# Patient Record
Sex: Female | Born: 1990 | Race: Black or African American | Hispanic: No | Marital: Single | Smoking: Former smoker
Health system: Southern US, Community
[De-identification: ages and names within clinical notes are randomized; demographics above are authoritative.]

## PROBLEM LIST (undated history)

## (undated) DIAGNOSIS — I10 Essential (primary) hypertension: Secondary | ICD-10-CM

## (undated) DIAGNOSIS — M419 Scoliosis, unspecified: Secondary | ICD-10-CM

## (undated) HISTORY — DX: Essential (primary) hypertension: I10

## (undated) HISTORY — PX: GALLBLADDER SURGERY: SHX652

---

## 2018-12-30 ENCOUNTER — Encounter (HOSPITAL_COMMUNITY): Payer: Self-pay | Admitting: *Deleted

## 2018-12-30 ENCOUNTER — Other Ambulatory Visit: Payer: Self-pay

## 2018-12-30 ENCOUNTER — Emergency Department (HOSPITAL_COMMUNITY)
Admission: EM | Admit: 2018-12-30 | Discharge: 2018-12-30 | Disposition: A | Discharge status: Home / Self Care | Payer: Self-pay | Attending: Emergency Medicine | Admitting: Emergency Medicine

## 2018-12-30 DIAGNOSIS — F17200 Nicotine dependence, unspecified, uncomplicated: Secondary | ICD-10-CM | POA: Insufficient documentation

## 2018-12-30 DIAGNOSIS — K0889 Other specified disorders of teeth and supporting structures: Secondary | ICD-10-CM | POA: Insufficient documentation

## 2018-12-30 MED ORDER — IBUPROFEN 400 MG PO TABS
600.0000 mg | ORAL_TABLET | Freq: Once | ORAL | Status: AC
  Administered 2018-12-30: 600 mg via ORAL
  Filled 2018-12-30: qty 1

## 2018-12-30 MED ORDER — AMOXICILLIN 500 MG PO CAPS: 500.0000 mg | ORAL_CAPSULE | Freq: Three times a day (TID) | ORAL | 0 refills | Status: DC

## 2018-12-30 MED ORDER — ACETAMINOPHEN 500 MG PO TABS
1000.0000 mg | ORAL_TABLET | Freq: Once | ORAL | Status: AC
  Administered 2018-12-30: 1000 mg via ORAL
  Filled 2018-12-30: qty 2

## 2018-12-30 NOTE — Discharge Instructions (Signed)
See your dentist later this week. Take Tylenol and ibuprofen as needed for pain. Take antibiotics as discussed.

## 2018-12-30 NOTE — ED Notes (Signed)
ED Provider at bedside. 

## 2018-12-30 NOTE — ED Provider Notes (Signed)
[facility] EMERGENCY DEPARTMENT Provider Note   CSN: [id number] Arrival date & time: 12/30/18  0003     History   Chief Complaint Chief Complaint  Patient presents with  . Dental Pain    HPI Brianna Lee is a 28 y.o. female.     Patient presents with worsening dental pain for the past 2 days.  Patient has a dentist.  No fevers or chills.  No significant swelling.  Pain constant.     History reviewed. No pertinent past medical history.  There are no active problems to display for this patient.   History reviewed. No pertinent surgical history.   OB History   No obstetric history on file.      Home Medications    Prior to Admission medications   Medication Sig Start Date End Date Taking? Authorizing Provider  amoxicillin (AMOXIL) 500 MG capsule Take 1 capsule (500 mg total) by mouth 3 (three) times daily. 12/30/18   Elnora Morrison, MD    Family History No family history on file.  Social History Social History   Tobacco Use  . Smoking status: Current Every Day Smoker  . Smokeless tobacco: Never Used  Substance Use Topics  . Alcohol use: Never    Frequency: Never  . Drug use: Never     Allergies   Patient has no known allergies.   Review of Systems Review of Systems  Constitutional: Negative for fever.  HENT: Positive for dental problem.   Respiratory: Negative for shortness of breath.   Gastrointestinal: Negative for vomiting.     Physical Exam Updated Vital Signs BP 112/75 (BP Location: Right Arm)   Pulse 77   Temp 97.9 F (36.6 C) (Oral)   Resp 16   Ht 5\' 4"  (1.626 m)   Wt 97.5 kg   LMP 12/23/2018   SpO2 100%   BMI 36.90 kg/m   Physical Exam Vitals signs and nursing note reviewed.  HENT:     Head: Normocephalic.     Comments: Mild tenderness surrounding missing teeth anterior right lower gingiva (premolar region).  No signs of abscess at this time.  No trismus, no submandibular swelling.     Mouth/Throat:   Mouth: Mucous membranes are moist.  Neck:     Musculoskeletal: Normal range of motion. Muscular tenderness present. No neck rigidity.  Cardiovascular:     Rate and Rhythm: Normal rate.  Lymphadenopathy:     Cervical: No cervical adenopathy.  Neurological:     Mental Status: She is alert.      ED Treatments / Results  Labs (all labs ordered are listed, but only abnormal results are displayed) Labs Reviewed - No data to display  EKG None  Radiology No results found.  Procedures Procedures (including critical care time)  Medications Ordered in ED Medications  acetaminophen (TYLENOL) tablet 1,000 mg (has no administration in time range)  ibuprofen (ADVIL) tablet 600 mg (has no administration in time range)     Initial Impression / Assessment and Plan / ED Course  I have reviewed the triage vital signs and the nursing notes.  Pertinent labs & imaging results that were available during my care of the patient were reviewed by me and considered in my medical decision making (see chart for details).       Patient presents with dental pain.  Concern for occult infection/gingivitis.  No abscess to drain at this time.  Discussed importance of antibiotics, pain control and follow-up with dentist this week.  Final Clinical Impressions(s) / ED Diagnoses   Final diagnoses:  Pain, dental    ED Discharge Orders         Ordered    amoxicillin (AMOXIL) 500 MG capsule  3 times daily     12/30/18 0151           Blane OharaZavitz, Emit Kuenzel, MD 12/30/18 0155

## 2018-12-30 NOTE — ED Triage Notes (Signed)
The pt has had a toothache  For 2 days she had ibu  2 hours ago  lmp one week

## 2019-02-04 ENCOUNTER — Emergency Department (HOSPITAL_COMMUNITY)
Admission: EM | Admit: 2019-02-04 | Discharge: 2019-02-04 | Disposition: A | Discharge status: Home / Self Care | Payer: Self-pay | Attending: Emergency Medicine | Admitting: Emergency Medicine

## 2019-02-04 ENCOUNTER — Other Ambulatory Visit: Payer: Self-pay

## 2019-02-04 ENCOUNTER — Encounter (HOSPITAL_COMMUNITY): Payer: Self-pay | Admitting: Emergency Medicine

## 2019-02-04 DIAGNOSIS — Z79899 Other long term (current) drug therapy: Secondary | ICD-10-CM | POA: Insufficient documentation

## 2019-02-04 DIAGNOSIS — S86801D Unspecified injury of other muscle(s) and tendon(s) at lower leg level, right leg, subsequent encounter: Secondary | ICD-10-CM | POA: Insufficient documentation

## 2019-02-04 DIAGNOSIS — S86801A Unspecified injury of other muscle(s) and tendon(s) at lower leg level, right leg, initial encounter: Secondary | ICD-10-CM

## 2019-02-04 DIAGNOSIS — W010XXD Fall on same level from slipping, tripping and stumbling without subsequent striking against object, subsequent encounter: Secondary | ICD-10-CM | POA: Insufficient documentation

## 2019-02-04 DIAGNOSIS — F17210 Nicotine dependence, cigarettes, uncomplicated: Secondary | ICD-10-CM | POA: Insufficient documentation

## 2019-02-04 NOTE — ED Provider Notes (Signed)
[facility] EMERGENCY DEPARTMENT Provider Note   CSN: [id number] Arrival date & time: 02/04/19  1148  History    Chief Complaint  Patient presents with  . Leg Pain    HPI Brianna Lee is a 28 y.o. female with no significant PMHx , who presents to the ED with calf pain.  Patient was seen at [facility] ED yesterday with the same calf pain.  They diagnosed her with a pulled muscle and sent her home with muscle relaxer and ibuprofen.  Patient reports that the pain is still very severe and has not improved.  She reports that she has not been able to walk and had to leave work. On Sunday evening, she was placing her godson on the ground when she describes right foot inversion prior to falling to the ground.  She reports that she did not feel great pain immediately but pain did continue to worsen when she got home.  In the morning when she woke up, she reports that she was stretching and felt severe pain in her left leg.  She was able to walk a little bit and was able to go to work but now the pain is worsened.  The physicians at the ED yesterday gave her return precautions and told her to get a second opinion if she continues to have worsening pain. She smokes cigarettes currently.  She is not on any contraception, she denies any leg swelling, redness, previous DVT, shortness of breath, chest pain.  She denies any previous trauma to this leg.  Allergies: Naproxen Medications: No current facility-administered medications for this encounter.   Current Outpatient Medications:  .  ibuprofen (ADVIL) 800 MG tablet, Take 800 mg by mouth every 8 (eight) hours as needed., Disp: , Rfl:  .  amoxicillin (AMOXIL) 500 MG capsule, Take 1 capsule (500 mg total) by mouth 3 (three) times daily., Disp: 21 capsule, Rfl: 0   Past Medical/Surgical History History reviewed. No pertinent past medical history. There are no active problems to display for this patient.  History reviewed. No pertinent surgical  history.  OB History  No obstetric history on file.    No family history on file. Social History   Tobacco Use  . Smoking status: Current Every Day Smoker  . Smokeless tobacco: Never Used  Substance Use Topics  . Alcohol use: Never    Frequency: Never  . Drug use: Never   Review of Systems Review of Systems  Constitutional: Negative for chills and fever.  HENT: Negative for ear pain and sore throat.   Eyes: Negative for pain and visual disturbance.  Respiratory: Negative for cough and shortness of breath.   Cardiovascular: Negative for chest pain and palpitations.  Gastrointestinal: Negative for abdominal pain and vomiting.  Musculoskeletal: Positive for myalgias. Negative for arthralgias and back pain.  Skin: Negative for color change and rash.  Neurological: Negative for seizures, syncope and weakness.  All other systems reviewed and are negative.  Physical Exam Updated Vital Signs BP 126/74 (BP Location: Right Arm)   Pulse 77   Temp 98.1 F (36.7 C) (Oral)   Resp 18   Wt 93 kg   LMP 01/16/2019   SpO2 99%   BMI 35.19 kg/m   Physical Exam Constitutional:      General: She is not in acute distress.    Appearance: Normal appearance. She is obese.  HENT:     Head: Normocephalic and atraumatic.     Nose: Nose normal.  Eyes:  Pupils: Pupils are equal, round, and reactive to light.  Neck:     Musculoskeletal: Normal range of motion.  Cardiovascular:     Pulses:          Dorsalis pedis pulses are 2+ on the right side and 2+ on the left side.       Posterior tibial pulses are 2+ on the right side and 2+ on the left side.  Pulmonary:     Effort: Pulmonary effort is normal.  Abdominal:     General: Abdomen is flat.  Musculoskeletal:       Legs:     Right foot: Normal range of motion.     Left foot: Normal range of motion.     Comments: Pain to palpation to posterior mid calf in area noted below. Patient has normal active and passive ROM or knee and ankle.  Pain elicited with foot dorsiflexion.   Feet:     Right foot:     Skin integrity: Skin integrity normal.     Left foot:     Skin integrity: Skin integrity normal.  Skin:    General: Skin is warm and dry.     Capillary Refill: Capillary refill takes less than 2 seconds.  Neurological:     General: No focal deficit present.     Mental Status: She is alert.     Sensory: Sensation is intact.     Motor: Motor function is intact.     Coordination: Coordination normal.     Deep Tendon Reflexes: Reflexes normal.     ED Treatments / Results  Labs (all labs ordered are listed, but only abnormal results are displayed) Labs Reviewed - No data to display  EKG None  Radiology No results found.  Procedures Procedures (including critical care time)  Medications Ordered in ED Medications - No data to display  Initial Impression / Assessment and Plan / ED Course  I have reviewed the triage vital signs and the nursing notes. Pertinent labs & imaging results that were available during my care of the patient were reviewed by me and considered in my medical decision making (see chart for details). Patient had traumatic injury to right lower leg after a sudden fall sustained on Sunday evening (2 evenings ago).  Patient reports that the pain has not improved and was told to get a second opinion without improving pain.  There is no Achilles tendon involvement on exam.  Patient's motor and sensory exam is intact.  Her pulses are 2+ at [facility] and posterior tibialis.  Unlikely to be gastrocnemius injury as patient's pain is not proximal and she has normal range of motion except for pain with dorsiflexion. Can also consider DVT given patient is a smoker, however, patient's well score 0. Her symptoms and history and exam are most consistent with plantar strain.  Given this likely diagnosis, patient's treatment will be mostly supportive care. Patient will be referred to sports medicine for further evaluation if  worsening symptoms and for rehab.  Will have patient avoid stretching exercises at this time.  Final Clinical Impressions(s) / ED Diagnoses   Final diagnoses:  Injury of right plantaris muscle or tendon   ED Discharge Orders    None     Disposition: Home with sports medicine follow-up as needed  Melene Plan, M.D. FM PGY-2      Melene Plan, MD 02/04/19 1319    Blane Ohara, MD 02/04/19 [phone]

## 2019-02-04 NOTE — ED Triage Notes (Signed)
Pt in with c/o R calf pain x 2 days. States pain began when she bent down to get a box. Pain worse with movement, muscle tight. States her doc told her xrays negative and "muscle pulled".

## 2019-02-04 NOTE — ED Notes (Signed)
ED Provider at bedside. 

## 2019-02-04 NOTE — Discharge Instructions (Addendum)
Today, we have diagnosed you with a plantars muscle strain that was most likely caused by her fall on Sunday night.  Unfortunately, there is not much that we can do about this injury.  Treatment for this will be mostly supportive care which is rest, ice, anti-inflammatories like ibuprofen or Tylenol.  You can continue the medications that you received from the emergency department yesterday.  Please follow-up with sports medicine if your pain continues to worsen or if you need any resources that will help you stay at work.  Please do not stretch the area as this can cause further pain and disrupt healing.  Do not do any physical exercises on this area until the pain is nearly relieved or until the sports medicine doctors tell you to do so.

## 2019-03-26 ENCOUNTER — Encounter (HOSPITAL_COMMUNITY): Payer: Self-pay | Admitting: Emergency Medicine

## 2019-03-26 ENCOUNTER — Emergency Department (HOSPITAL_COMMUNITY)
Admission: EM | Admit: 2019-03-26 | Discharge: 2019-03-26 | Disposition: A | Discharge status: Home / Self Care | Payer: Self-pay | Attending: Emergency Medicine | Admitting: Emergency Medicine

## 2019-03-26 ENCOUNTER — Other Ambulatory Visit: Payer: Self-pay

## 2019-03-26 DIAGNOSIS — S46911A Strain of unspecified muscle, fascia and tendon at shoulder and upper arm level, right arm, initial encounter: Secondary | ICD-10-CM | POA: Insufficient documentation

## 2019-03-26 DIAGNOSIS — X500XXA Overexertion from strenuous movement or load, initial encounter: Secondary | ICD-10-CM | POA: Insufficient documentation

## 2019-03-26 DIAGNOSIS — Y9389 Activity, other specified: Secondary | ICD-10-CM | POA: Insufficient documentation

## 2019-03-26 DIAGNOSIS — Y999 Unspecified external cause status: Secondary | ICD-10-CM | POA: Insufficient documentation

## 2019-03-26 DIAGNOSIS — Y990 Civilian activity done for income or pay: Secondary | ICD-10-CM | POA: Insufficient documentation

## 2019-03-26 DIAGNOSIS — F17210 Nicotine dependence, cigarettes, uncomplicated: Secondary | ICD-10-CM | POA: Insufficient documentation

## 2019-03-26 DIAGNOSIS — Z79899 Other long term (current) drug therapy: Secondary | ICD-10-CM | POA: Insufficient documentation

## 2019-03-26 DIAGNOSIS — Y929 Unspecified place or not applicable: Secondary | ICD-10-CM | POA: Insufficient documentation

## 2019-03-26 LAB — I-STAT BETA HCG BLOOD, ED (MC, WL, AP ONLY): I-stat hCG, quantitative: <5 m[IU]/mL (ref <5)

## 2019-03-26 MED ORDER — METHOCARBAMOL 500 MG PO TABS
500.0000 mg | ORAL_TABLET | Freq: Once | ORAL | Status: AC
  Administered 2019-03-26: 12:00:00 500 mg via ORAL
  Filled 2019-03-26: qty 1

## 2019-03-26 MED ORDER — METHOCARBAMOL 500 MG PO TABS: 500.0000 mg | ORAL_TABLET | Freq: Two times a day (BID) | ORAL | 0 refills | Status: DC

## 2019-03-26 NOTE — ED Triage Notes (Signed)
C/o R arm pain that started while at work yesterday.  Pain worse when she raises R arm.

## 2019-03-26 NOTE — ED Notes (Signed)
Patient verbalizes understanding of discharge instructions. Opportunity for questioning and answers were provided. Armband removed by staff, pt discharged from ED.  

## 2019-03-26 NOTE — ED Provider Notes (Signed)
[facility] EMERGENCY DEPARTMENT Provider Note   CSN: [id number] Arrival date & time: 03/26/19  9622     History   Chief Complaint Chief Complaint  Patient presents with  . Arm Pain    HP  Brianna Lee is a 28 y.o. female who presents to ED with a chief complaint of dominant right shoulder and arm pain. Since yesterday started having aching, intermittently stabbing right-sided arm pain. She is concerned that she may have pulled a muscle as she does a lot of overhead reaching, lifting and moving at work. She has tried several doses of ibuprofen and icy hot with minimal improvement. Her pain got worse today when she woke up. Denies any direct injuries or falls, prior fracture, dislocation procedure in the area, chest pain, numbness in arms, warmth or swelling of joint.     HPI  History reviewed. No pertinent past medical history.  There are no active problems to display for this patient.   History reviewed. No pertinent surgical history.   OB History   No obstetric history on file.      Home Medications    Prior to Admission medications   Medication Sig Start Date End Date Taking? Authorizing Provider  amoxicillin (AMOXIL) 500 MG capsule Take 1 capsule (500 mg total) by mouth 3 (three) times daily. 12/30/18   Elnora Morrison, MD  ibuprofen (ADVIL) 800 MG tablet Take 800 mg by mouth every 8 (eight) hours as needed.    [provider]  methocarbamol (ROBAXIN) 500 MG tablet Take 1 tablet (500 mg total) by mouth 2 (two) times daily. 03/26/19   Delia Heady, PA-C    Family History No family history on file.  Social History Social History   Tobacco Use  . Smoking status: Current Every Day Smoker  . Smokeless tobacco: Never Used  Substance Use Topics  . Alcohol use: Never    Frequency: Never  . Drug use: Never     Allergies   Naproxen   Review of Systems Review of Systems  Constitutional: Negative for chills and fever.  Musculoskeletal:  Positive for arthralgias and myalgias.  Skin: Negative for wound.  Neurological: Negative for weakness and numbness.     Physical Exam Updated Vital Signs BP 128/82 (BP Location: Right Arm)   Pulse 90   Temp 98.6 F (37 C) (Oral)   Resp 14   LMP 03/13/2019   SpO2 100%   Physical Exam Vitals signs and nursing note reviewed.  Constitutional:      General: She is not in acute distress.    Appearance: She is well-developed. She is not diaphoretic.  HENT:     Head: Normocephalic and atraumatic.  Eyes:     General: No scleral icterus.    Conjunctiva/sclera: Conjunctivae normal.  Neck:     Musculoskeletal: Normal range of motion.  Cardiovascular:     Rate and Rhythm: Normal rate and regular rhythm.     Heart sounds: Normal heart sounds.  Pulmonary:     Effort: Pulmonary effort is normal. No respiratory distress.     Breath sounds: Normal breath sounds.  Musculoskeletal: Normal range of motion.        General: Tenderness present. No swelling.     Comments: Tenderness palpation diffusely of the right shoulder without warmth, erythema or overlying skin changes of joint. Full active and passive range of motion of joint, but pain noted with abduction. 2+ radial pulse palpated.  Skin:    Findings: No rash.  Neurological:     Mental Status: She is alert.      ED Treatments / Results  Labs (all labs ordered are listed, but only abnormal results are displayed) Labs Reviewed  I-STAT BETA HCG BLOOD, ED ([facility], [facility], [facility] ONLY)    EKG None  Radiology No results found.  Procedures Procedures (including critical care time)  Medications Ordered in ED Medications  methocarbamol (ROBAXIN) tablet 500 mg (500 mg Oral Given 03/26/19 1202)     Initial Impression / Assessment and Plan / ED Course  I have reviewed the triage vital signs and the nursing notes.  Pertinent labs & imaging results that were available during my care of the patient were reviewed by me and considered in my  medical decision making (see chart for details).        27 year old female presents to ED for right shoulder and arm pain since yesterday.  Denies any direct injuries or falls, changes to range of motion, changes sensation, swelling or redness of joint.  She is concerned that she may have pulled a muscle as she does a lot of lifting, overhead reaching and movement at work.  On exam there is tenderness palpation of the shoulder without deformity, overlying skin changes, warmth or changes to range of motion.  Suspect that symptoms are musculoskeletal in nature.  Doubt infectious or vascular cause of symptoms.  Will treat with muscle relaxer.  Patient is hemodynamically stable, in NAD, and able to ambulate in the ED. Evaluation does not show pathology that would require ongoing emergent intervention or inpatient treatment. I explained the diagnosis to the patient. Pain has been managed and has no complaints prior to discharge. Patient is comfortable with above plan and is stable for discharge at this time. All questions were answered prior to disposition. Strict return precautions for returning to the ED were discussed. Encouraged follow up with PCP.   An After Visit Summary was printed and given to the patient.   Portions of this note were generated with Scientist, clinical (histocompatibility and immunogenetics). Dictation errors may occur despite best attempts at proofreading.  Final Clinical Impressions(s) / ED Diagnoses   Final diagnoses:  Strain of right shoulder, initial encounter    ED Discharge Orders         Ordered    methocarbamol (ROBAXIN) 500 MG tablet  2 times daily     03/26/19 1147           Dietrich Pates, PA-C 03/26/19 1217    Melene Plan, DO 03/26/19 1504

## 2019-03-26 NOTE — Discharge Instructions (Signed)
Take the medications to help with your symptoms. Return to the ED if you start to experience worsening symptoms, injuries or falls, numbness in arms, swelling of your joints.

## 2019-03-29 ENCOUNTER — Other Ambulatory Visit: Payer: Self-pay

## 2019-03-29 ENCOUNTER — Emergency Department (HOSPITAL_COMMUNITY): Admission: EM | Admit: 2019-03-29 | Discharge: 2019-03-29 | Discharge status: Absent without leave | Payer: Self-pay

## 2019-04-13 ENCOUNTER — Emergency Department (HOSPITAL_COMMUNITY)
Admission: EM | Admit: 2019-04-13 | Discharge: 2019-04-13 | Disposition: A | Discharge status: Home / Self Care | Payer: Self-pay | Attending: Emergency Medicine | Admitting: Emergency Medicine

## 2019-04-13 ENCOUNTER — Other Ambulatory Visit: Payer: Self-pay

## 2019-04-13 ENCOUNTER — Encounter (HOSPITAL_COMMUNITY): Payer: Self-pay

## 2019-04-13 DIAGNOSIS — K047 Periapical abscess without sinus: Secondary | ICD-10-CM | POA: Insufficient documentation

## 2019-04-13 DIAGNOSIS — K029 Dental caries, unspecified: Secondary | ICD-10-CM | POA: Insufficient documentation

## 2019-04-13 DIAGNOSIS — F17200 Nicotine dependence, unspecified, uncomplicated: Secondary | ICD-10-CM | POA: Insufficient documentation

## 2019-04-13 DIAGNOSIS — Z79899 Other long term (current) drug therapy: Secondary | ICD-10-CM | POA: Insufficient documentation

## 2019-04-13 MED ORDER — AMOXICILLIN-POT CLAVULANATE 875-125 MG PO TABS: 1.0000 | ORAL_TABLET | Freq: Two times a day (BID) | ORAL | 0 refills | Status: AC

## 2019-04-13 MED ORDER — AMOXICILLIN-POT CLAVULANATE 875-125 MG PO TABS
1.0000 | ORAL_TABLET | Freq: Once | ORAL | Status: AC
  Administered 2019-04-13: 11:00:00 1 via ORAL
  Filled 2019-04-13: qty 1

## 2019-04-13 MED ORDER — HYDROCODONE-ACETAMINOPHEN 5-325 MG PO TABS: 1.0000 | ORAL_TABLET | Freq: Four times a day (QID) | ORAL | 0 refills | Status: DC | PRN

## 2019-04-13 MED ORDER — ACETAMINOPHEN 500 MG PO TABS
1000.0000 mg | ORAL_TABLET | Freq: Once | ORAL | Status: AC
  Administered 2019-04-13: 11:00:00 1000 mg via ORAL
  Filled 2019-04-13: qty 2

## 2019-04-13 NOTE — Discharge Instructions (Signed)
Take antibiotics as directed. Please take all of your antibiotics until finished.  You can take Tylenol or Ibuprofen as directed for pain. You can alternate Tylenol and Ibuprofen every 4 hours. If you take Tylenol at 1pm, then you can take Ibuprofen at 5pm. Then you can take Tylenol again at 9pm.   The exam and treatment you received today has been provided on an emergency basis only. This is not a substitute for complete medical or dental care. If your problem worsens or new symptoms (problems) appear, and you are unable to arrange prompt follow-up care with your dentist, call or return to this location. If you do not have a dentist, please follow-up with one on the list provided  CALL YOUR DENTIST OR RETURN IMMEDIATELY IF you develop a fever, rash, difficulty breathing or swallowing, neck or facial swelling, or other potentially serious concerns.   Please follow-up with one of the dental clinics provided to you below or in your paperwork. Call and tell them you were seen in the Emergency Dept and arrange for an appointment. You may have to call multiple places in order to find a place to be seen.  Dental Assistance If the dentist on-call cannot see you, please use the resources below:   Patients with Medicaid: [facility] [address], [phone], [phone]  If unable to pay, or uninsured, contact [facility] ([phone]) or [facility] ([phone] in [place], [phone] in [place]) to become qualified for the adult dental clinic  Other Low-Cost Community Dental Services: [facility]- [address], [place], [place], [address]    [phone], Ext. [phone]    2nd and 4th Thursday of the month at 6:30am    10 clients each day by appointment, can sometimes see walk-in     patients if someone does not show for an appointment [facility]- [address], [place], [place], [address]    [phone]  [facility]- [address], [place], [place], [address]    [phone]  [facility]- [phone] [facility]- [phone] [facility]- [phone]  

## 2019-04-13 NOTE — ED Provider Notes (Signed)
[facility]   CSN: [id number] Arrival date & time: 04/13/19  [id number]     History Chief Complaint  Patient presents with  . Dental Pain  . Facial Swelling  . Headache    Brianna Lee is a 28 y.o. female who presents for evaluation of left lower dental pain, facial swelling and headache that began last night.  Patient states that she intermittently will have some dental pain but last night, got acutely worse.  She noticed some mild swelling of her left lower face and then felt like this morning, since worsened.  She has not noticed any overlying warmth or erythema.  She states she has not noted any fevers.  She is not taking any medication for the symptoms.  She states she does have a dentist but since it was Sunday, she cannot get in with him.  She is still able tolerate her secretions and p.o.  She has not any trouble breathing and denies any vomiting.  She states that after her dental pain started, she started developing headache.  This is mild in nature.  She denies any vision changes, numbness/weakness of arms or legs.  Patient denies any tongue or lip swelling.   The history is provided by the patient.       History reviewed. No pertinent past medical history.  There are no problems to display for this patient.   History reviewed. No pertinent surgical history.   OB History   No obstetric history on file.     No family history on file.  Social History   Tobacco Use  . Smoking status: Current Every Day Smoker  . Smokeless tobacco: Never Used  Substance Use Topics  . Alcohol use: Never  . Drug use: Never    Home Medications Prior to Admission medications   Medication Sig Start Date End Date Taking? Authorizing Provider  amoxicillin (AMOXIL) 500 MG capsule Take 1 capsule (500 mg total) by mouth 3 (three) times daily. 12/30/18   Blane OharaZavitz, Joshua, MD  amoxicillin-clavulanate (AUGMENTIN) 875-125 MG tablet Take 1 tablet by  mouth every 12 (twelve) hours for 7 days. 04/13/19 04/20/19  Maxwell CaulLayden, Caeleb Batalla A, PA-C  HYDROcodone-acetaminophen (NORCO/VICODIN) 5-325 MG tablet Take 1-2 tablets by mouth every 6 (six) hours as needed. 04/13/19   Maxwell CaulLayden, Trishelle Devora A, PA-C  ibuprofen (ADVIL) 800 MG tablet Take 800 mg by mouth every 8 (eight) hours as needed.    [provider]  methocarbamol (ROBAXIN) 500 MG tablet Take 1 tablet (500 mg total) by mouth 2 (two) times daily. 03/26/19   Khatri, Hina, PA-C    Allergies    Naproxen  Review of Systems   Review of Systems  Constitutional: Negative for fever.  HENT: Positive for dental problem and facial swelling. Negative for trouble swallowing.   Respiratory: Negative for shortness of breath.   Cardiovascular: Negative for chest pain.  Gastrointestinal: Negative for nausea and vomiting.  Neurological: Positive for headaches. Negative for weakness and numbness.  All other systems reviewed and are negative.   Physical Exam Updated Vital Signs BP 129/83 (BP Location: Right Arm)   Pulse 83   Temp 98.5 F (36.9 C) (Oral)   Resp 16   SpO2 100%   Physical Exam Vitals and nursing note reviewed.  Constitutional:      Appearance: She is well-developed.  HENT:     Head: Normocephalic and atraumatic.      Comments: Mild soft tissue swelling noted to the left lower  face.  No overlying warmth, erythema.  No swelling noted to the chin or submandibular area.    Mouth/Throat:     Dentition: Abnormal dentition.      Comments: Poor dentition throughout.  Multiple dental caries.  Tenderness palpation surrounding tooth #2021.  Dental caries noted in tooth #20.  No obvious fluctuance or identifiable palpable abscess.  Airways patent, phonation is intact.  No swelling noted to floor of mouth. Eyes:     General: No scleral icterus.       Right eye: No discharge.        Left eye: No discharge.     Conjunctiva/sclera: Conjunctivae normal.  Pulmonary:     Effort: Pulmonary effort is  normal.  Skin:    General: Skin is warm and dry.  Neurological:     Mental Status: She is alert.     Comments: Cranial nerves II through XII intact. Normal strength, coordination.  Psychiatric:        Speech: Speech normal.        Behavior: Behavior normal.     ED Results / Procedures / Treatments   Labs (all labs ordered are listed, but only abnormal results are displayed) Labs Reviewed - No data to display  EKG None  Radiology No results found.  Procedures Procedures (including critical care time)  Medications Ordered in ED Medications  acetaminophen (TYLENOL) tablet 1,000 mg (1,000 mg Oral Given 04/13/19 1111)  amoxicillin-clavulanate (AUGMENTIN) 875-125 MG per tablet 1 tablet (1 tablet Oral Given 04/13/19 1111)    ED Course  I have reviewed the triage vital signs and the nursing notes.  Pertinent labs & imaging results that were available during my care of the patient were reviewed by me and considered in my medical decision making (see chart for details).    MDM Rules/Calculators/A&P                      28 year old female who presents for evaluation of dental pain, left lower face swelling and headache that began yesterday.  No fevers.  She is able to tolerate secretions and p.o.  On initially arrival, she is afebrile, nontoxic-appearing.  Vital signs are stable.  On exam, she has no cranial nerve deficits.  She does have some mild soft tissue swelling in the left lower face.  No overlying warmth, erythema.  She has poor dentition throughout with noted dental caries in the lower teeth.  There is no obvious identifiable abscess but suspect her symptoms are due to dental abscess.  History/physical exam is not concerning for Ludwig's angina or peritonsillar abscess.  I did discuss treatment options with patient.  I discussed with her that there is no obvious abscess but I could attempt to drain where she is having the most swelling and see if there is any abscess that  would be amenable to I&D.  We discussed alternative treatments with antibiotic and dental follow-up.  Patient does not wish to have I&D here in the emergency department.  I feel that this is reasonable.  We will start her on antibiotics.  Patient with no known drug allergies except for naproxen.  Patient with no recent narcotic prescriptions on PMP.  Given that it is the weekend, we will give her small course of pain medication to help with acute pain.  Patient instructed to call her dentist office tomorrow.  We will also provide her with outpatient dental resources. At this time, patient exhibits no emergent life-threatening condition that require  further evaluation in ED or admission. Patient had ample opportunity for questions and discussion. All patient's questions were answered with full understanding. Strict return precautions discussed. Patient expresses understanding and agreement to plan.   Portions of this note were generated with [employer]. Dictation errors may occur despite best attempts at proofreading.  Final Clinical Impression(s) / ED Diagnoses Final diagnoses:  Dental abscess    Rx / DC Orders ED Discharge Orders         Ordered    amoxicillin-clavulanate (AUGMENTIN) 875-125 MG tablet  Every 12 hours     04/13/19 1108    HYDROcodone-acetaminophen (NORCO/VICODIN) 5-325 MG tablet  Every 6 hours PRN     04/13/19 1108           Volanda Napoleon, PA-C 04/13/19 1117    Lajean Saver, MD 04/13/19 1150

## 2019-04-13 NOTE — ED Triage Notes (Signed)
Patient complains of headache and dental problem that she thinks is abscessed. Reports headache started after dental pain

## 2019-04-22 ENCOUNTER — Other Ambulatory Visit: Payer: Self-pay

## 2019-04-22 ENCOUNTER — Emergency Department (HOSPITAL_COMMUNITY)
Admission: EM | Admit: 2019-04-22 | Discharge: 2019-04-22 | Disposition: A | Discharge status: Home / Self Care | Payer: Self-pay | Attending: Emergency Medicine | Admitting: Emergency Medicine

## 2019-04-22 ENCOUNTER — Emergency Department (HOSPITAL_COMMUNITY): Payer: Self-pay

## 2019-04-22 ENCOUNTER — Encounter (HOSPITAL_COMMUNITY): Payer: Self-pay

## 2019-04-22 DIAGNOSIS — F17200 Nicotine dependence, unspecified, uncomplicated: Secondary | ICD-10-CM | POA: Insufficient documentation

## 2019-04-22 DIAGNOSIS — M545 Low back pain, unspecified: Secondary | ICD-10-CM

## 2019-04-22 LAB — PREGNANCY, URINE: Preg Test, Ur: NEGATIVE

## 2019-04-22 MED ORDER — CYCLOBENZAPRINE HCL 5 MG PO TABS: 5.0000 mg | ORAL_TABLET | Freq: Three times a day (TID) | ORAL | 0 refills | Status: DC | PRN

## 2019-04-22 MED ORDER — ACETAMINOPHEN 325 MG PO TABS
650.0000 mg | ORAL_TABLET | Freq: Once | ORAL | Status: AC
  Administered 2019-04-22: 11:00:00 650 mg via ORAL
  Filled 2019-04-22: qty 2

## 2019-04-22 MED ORDER — LIDOCAINE 5 % EX PTCH
1.0000 | MEDICATED_PATCH | CUTANEOUS | Status: DC
  Administered 2019-04-22: 1 via TRANSDERMAL
  Filled 2019-04-22 (×2): qty 1

## 2019-04-22 MED ORDER — CYCLOBENZAPRINE HCL 10 MG PO TABS
5.0000 mg | ORAL_TABLET | Freq: Once | ORAL | Status: AC
  Administered 2019-04-22: 11:00:00 5 mg via ORAL
  Filled 2019-04-22: qty 1

## 2019-04-22 NOTE — ED Notes (Signed)
Patient awake appears groggy, states pain 9, lying on cart awaiting xray,assessment unchanged

## 2019-04-22 NOTE — ED Triage Notes (Signed)
Patient complains of lower back injury after lifting boxes at work yesterday. Pain with any ROM.

## 2019-04-22 NOTE — ED Provider Notes (Signed)
[facility] EMERGENCY DEPARTMENT Provider Note   CSN: [id number] Arrival date & time: 04/22/19  7062     History No chief complaint on file.   Brianna Lee is a 29 y.o. female.  Patient arrives to the ED from home after lower back injury that occurred yesterday afternoon while she was lifting boxes at work. Patient states that she pulled her lower back and the pain became worse when she left work. She treated the pain with 400 mg ibuprofen and heat pack. States that ibuprofen helped a little bit and the heat helped, but worsening pain this morning. Brianna Lee says that she does feel like the pain moves down her left left. She is able to ambulate but with pain. No PMH to note. She has an allergy to naproxen, stating that she gets oral swelling.         History reviewed. No pertinent past medical history.  There are no problems to display for this patient.   History reviewed. No pertinent surgical history.   OB History   No obstetric history on file.     No family history on file.  Social History   Tobacco Use  . Smoking status: Current Every Day Smoker  . Smokeless tobacco: Never Used  Substance Use Topics  . Alcohol use: Never  . Drug use: Never    Home Medications Prior to Admission medications   Medication Sig Start Date End Date Taking? Authorizing Provider  amoxicillin (AMOXIL) 500 MG capsule Take 1 capsule (500 mg total) by mouth 3 (three) times daily. 12/30/18   Blane Ohara, MD  HYDROcodone-acetaminophen (NORCO/VICODIN) 5-325 MG tablet Take 1-2 tablets by mouth every 6 (six) hours as needed. 04/13/19   Maxwell Caul, PA-C  ibuprofen (ADVIL) 800 MG tablet Take 800 mg by mouth every 8 (eight) hours as needed.    [provider]  methocarbamol (ROBAXIN) 500 MG tablet Take 1 tablet (500 mg total) by mouth 2 (two) times daily. 03/26/19   Khatri, Hina, PA-C    Allergies    Naproxen  Review of Systems   Review of Systems    Constitutional: Negative for chills and fever.  HENT: Negative for ear pain and sore throat.   Eyes: Negative for pain and visual disturbance.  Respiratory: Negative for cough and shortness of breath.   Cardiovascular: Negative for chest pain and palpitations.  Gastrointestinal: Negative for abdominal pain and vomiting.  Genitourinary: Negative for dysuria and hematuria.  Musculoskeletal: Positive for back pain. Negative for arthralgias, joint swelling and neck pain.  Skin: Negative for color change and rash.  Neurological: Negative for seizures, syncope and headaches.  All other systems reviewed and are negative.   Physical Exam Updated Vital Signs BP 112/68   Pulse 96   Temp 98.3 F (36.8 C) (Oral)   Resp 18   SpO2 100%   Physical Exam Vitals and nursing note reviewed.  Constitutional:      General: She is not in acute distress.    Appearance: She is well-developed.  HENT:     Head: Normocephalic and atraumatic.     Nose: Nose normal.     Mouth/Throat:     Mouth: Mucous membranes are moist.     Pharynx: Oropharynx is clear.  Eyes:     Extraocular Movements: Extraocular movements intact.     Conjunctiva/sclera: Conjunctivae normal.     Pupils: Pupils are equal, round, and reactive to light.  Cardiovascular:     Rate and Rhythm: Normal  rate and regular rhythm.     Pulses: Normal pulses.     Heart sounds: Normal heart sounds. No murmur.  Pulmonary:     Effort: Pulmonary effort is normal. No respiratory distress.     Breath sounds: Normal breath sounds.  Abdominal:     Palpations: Abdomen is soft.     Tenderness: There is no abdominal tenderness.  Musculoskeletal:        General: Tenderness present.     Cervical back: Normal, normal range of motion and neck supple.     Thoracic back: Normal.     Lumbar back: Spasms, tenderness and bony tenderness present. No swelling, edema, deformity or signs of trauma. Decreased range of motion. Positive right straight leg raise  test and positive left straight leg raise test.  Skin:    General: Skin is warm and dry.     Capillary Refill: Capillary refill takes less than 2 seconds.  Neurological:     General: No focal deficit present.     Mental Status: She is alert. Mental status is at baseline.     ED Results / Procedures / Treatments   Labs (all labs ordered are listed, but only abnormal results are displayed) Labs Reviewed  PREGNANCY, URINE    EKG None  Radiology No results found.  Procedures Procedures (including critical care time)  Medications Ordered in ED Medications  cyclobenzaprine (FLEXERIL) tablet 5 mg (has no administration in time range)  acetaminophen (TYLENOL) tablet 650 mg (has no administration in time range)    ED Course  I have reviewed the triage vital signs and the nursing notes.  Pertinent labs & imaging results that were available during my care of the patient were reviewed by me and considered in my medical decision making (see chart for details).    MDM Rules/Calculators/A&P                      Will obtain lumbar XR to r/o vertebral injury. Patient with back spasms on exam, pain to palpation to lumbar spine only. Patient denies pregnancy, but will obtain UPT prior to obtaining lumbar XR. Will treat pain with PO acetaminophen (allergy noted to naproxen, patient states oral swelling, will avoid NSAIDs) and flexeril.   Differentials include musculoskeletal injury to lumbar spine, lumbar spinal stenosis, degenerative arthritis, radiculopathy, sciatica. Patient has no concerning neurological deficits, no need for MRI at this time.   Will provide patient a work note. Discussed supportive care at home with rest/heat/medications.   Care shared with my attending, Dr. Jodelle Red who is in agreement with the plan.   1242: checked on patient, sleeping and appears in NAD. Continue waiting for XR results.   1351: XR reviewed by myself and attending with no abnormal findings. Continue  supportive care at home including rest. Flexeril prescription provided for back spasms. Avoid NSAIDs d/t allergy. Lidocaine patch ordered for patient while in ED. Sister here and able to drive patient home.   Final Clinical Impression(s) / ED Diagnoses Final diagnoses:  Lumbar back pain    Rx / DC Orders ED Discharge Orders    None       Anthoney Harada, NP 04/22/19 1352    Harlene Salts, MD 04/22/19 [phone]

## 2019-04-22 NOTE — ED Notes (Signed)
Patient asleep on stretcher, assessment unchanged, awaiting xray

## 2019-04-22 NOTE — ED Notes (Signed)
Patient transported to X-ray 

## 2019-04-22 NOTE — ED Notes (Signed)
Taylor NP at bedside. 

## 2019-04-22 NOTE — ED Notes (Signed)
Patient transported to X-ray via stretcher with tech. 

## 2019-04-22 NOTE — ED Notes (Signed)
Patient returns from xray,lying prone, color pink,chest clear,good aeration,no retractions, 3plus pulses<2sec refill,patient states pain is 9/10, "is staying there", groggy with slurred speech, awaiting results

## 2019-04-30 ENCOUNTER — Encounter (HOSPITAL_COMMUNITY): Payer: Self-pay | Admitting: Emergency Medicine

## 2019-04-30 ENCOUNTER — Other Ambulatory Visit: Payer: Self-pay

## 2019-04-30 ENCOUNTER — Emergency Department (HOSPITAL_COMMUNITY)
Admission: EM | Admit: 2019-04-30 | Discharge: 2019-04-30 | Disposition: A | Discharge status: Home / Self Care | Payer: Self-pay | Attending: Emergency Medicine | Admitting: Emergency Medicine

## 2019-04-30 DIAGNOSIS — Y990 Civilian activity done for income or pay: Secondary | ICD-10-CM | POA: Insufficient documentation

## 2019-04-30 DIAGNOSIS — Y9389 Activity, other specified: Secondary | ICD-10-CM | POA: Insufficient documentation

## 2019-04-30 DIAGNOSIS — Y9289 Other specified places as the place of occurrence of the external cause: Secondary | ICD-10-CM | POA: Insufficient documentation

## 2019-04-30 DIAGNOSIS — S39012D Strain of muscle, fascia and tendon of lower back, subsequent encounter: Secondary | ICD-10-CM | POA: Insufficient documentation

## 2019-04-30 DIAGNOSIS — X500XXA Overexertion from strenuous movement or load, initial encounter: Secondary | ICD-10-CM | POA: Insufficient documentation

## 2019-04-30 DIAGNOSIS — F17210 Nicotine dependence, cigarettes, uncomplicated: Secondary | ICD-10-CM | POA: Insufficient documentation

## 2019-04-30 MED ORDER — METHOCARBAMOL 750 MG PO TABS: 750.0000 mg | ORAL_TABLET | Freq: Every evening | ORAL | 0 refills | Status: AC | PRN

## 2019-04-30 NOTE — ED Notes (Signed)
Pt given dc instructions pt verbalizes understanding.  

## 2019-04-30 NOTE — Discharge Instructions (Addendum)
You may alternate taking Tylenol as needed for pain control. You may take [phone] mg of Tylenol every 6 hours. Do not exceed 4000 mg of Tylenol daily as this can lead to liver damage. You may use warm and cold compresses to help with your symptoms.    You were given a prescription for Robaxin which is a muscle relaxer.  You should not drive, work, or operate machinery while taking this medication as it can make you very drowsy.    Please follow up with your primary care provider within 5-7 days for re-evaluation of your symptoms. If you do not have a primary care provider, information for a healthcare clinic has been provided for you to make arrangements for follow up care.   Return to the emergency department immediately if you experience any back pain associated with fevers, loss of control of your bowels/bladder, weakness/numbness to your legs, numbness to your groin area, inability to walk, or inability to urinate.

## 2019-04-30 NOTE — ED Provider Notes (Signed)
[facility] EMERGENCY DEPARTMENT Provider Note   CSN: [id number] Arrival date & time: 04/30/19  1148     History Chief Complaint  Patient presents with  . Back Pain    Brianna Lee is a 29 y.o. female.  HPI   29 year old female presenting for evaluation of low back pain.  States that she had an injury last week when she was lifting boxes at work.  She felt like she had pulled her lower back while doing some lifting.  She was seen in the ED at that time and had x-ray which did not show any evidence of acute traumatic injury.  She had a reassuring exam.  She was discharged with a prescription for Flexeril.  She states that the Flexeril, Tylenol and heat have been helping at home however she returned to work today and noticed a stinging sensation in her back and felt like her back was tight.  She denies any radiation of pain to the bilateral lower extremities.  No numbness/weakness of the legs.  No urinary or bowel incontinence.  No urinary retention, fevers, history of IV drug use or cancer.  She denies any chance of pregnancy.  History reviewed. No pertinent past medical history.  There are no problems to display for this patient.   History reviewed. No pertinent surgical history.   OB History   No obstetric history on file.     No family history on file.  Social History   Tobacco Use  . Smoking status: Current Every Day Smoker  . Smokeless tobacco: Never Used  Substance Use Topics  . Alcohol use: Yes  . Drug use: Never    Home Medications Prior to Admission medications   Medication Sig Start Date End Date Taking? Authorizing Provider  amoxicillin (AMOXIL) 500 MG capsule Take 1 capsule (500 mg total) by mouth 3 (three) times daily. 12/30/18   Blane Ohara, MD  cyclobenzaprine (FLEXERIL) 5 MG tablet Take 1 tablet (5 mg total) by mouth 3 (three) times daily as needed for muscle spasms. 04/22/19   Ree Shay, MD  HYDROcodone-acetaminophen (NORCO/VICODIN)  5-325 MG tablet Take 1-2 tablets by mouth every 6 (six) hours as needed. 04/13/19   Maxwell Caul, PA-C  ibuprofen (ADVIL) 800 MG tablet Take 800 mg by mouth every 8 (eight) hours as needed.    [provider]  methocarbamol (ROBAXIN) 750 MG tablet Take 1 tablet (750 mg total) by mouth at bedtime as needed for up to 5 days for muscle spasms. 04/30/19 05/05/19  Bryce Cheever S, PA-C    Allergies    Naproxen  Review of Systems   Review of Systems  Constitutional: Negative for fever.  HENT: Negative for ear pain and sore throat.   Eyes: Negative for visual disturbance.  Respiratory: Negative for cough and shortness of breath.   Cardiovascular: Negative for chest pain.  Gastrointestinal: Negative for abdominal pain and vomiting.       No loss of control of bowel function  Genitourinary: Negative for dysuria, flank pain and hematuria.       No loss of control of bladder function  Musculoskeletal: Positive for back pain.  Skin: Negative for rash.  Neurological: Negative for weakness, numbness and headaches.  All other systems reviewed and are negative.   Physical Exam Updated Vital Signs BP 113/75 (BP Location: Right Arm)   Pulse (!) 101   Temp 98.2 F (36.8 C) (Oral)   Resp 18   SpO2 100%   Physical Exam  Vitals and nursing note reviewed.  Constitutional:      General: She is not in acute distress.    Appearance: She is well-developed.  HENT:     Head: Normocephalic and atraumatic.  Eyes:     Conjunctiva/sclera: Conjunctivae normal.  Cardiovascular:     Rate and Rhythm: Normal rate and regular rhythm.     Heart sounds: No murmur.  Pulmonary:     Effort: Pulmonary effort is normal. No respiratory distress.     Breath sounds: Normal breath sounds.  Abdominal:     Palpations: Abdomen is soft.     Tenderness: There is no abdominal tenderness.  Musculoskeletal:     Cervical back: Neck supple.  Skin:    General: Skin is warm and dry.  Neurological:     Mental  Status: She is alert.     Comments: Motor: Normal tone. 5/5 strength of BUE and BLE major muscle groups including strong and equal grip strength and dorsiflexion/plantar flexion Sensory: light touch normal in all extremities. Gait: normal gait and balance.       ED Results / Procedures / Treatments   Labs (all labs ordered are listed, but only abnormal results are displayed) Labs Reviewed - No data to display  EKG None  Radiology No results found.  Procedures Procedures (including critical care time)  Medications Ordered in ED Medications - No data to display  ED Course  I have reviewed the triage vital signs and the nursing notes.  Pertinent labs & imaging results that were available during my care of the patient were reviewed by me and considered in my medical decision making (see chart for details).    MDM Rules/Calculators/A&P                      29 year old female presenting for evaluation of low back pain.  States that she had an injury last week when she was lifting boxes at work.  She felt like she had pulled her lower back while doing some lifting.  She was seen in the ED at that time and had x-ray which did not show any evidence of acute traumatic injury.  She had a reassuring exam.  She was discharged with a prescription for Flexeril.  She states that the Flexeril, Tylenol and heat have been helping at home however she returned to work today and noticed a stinging sensation in her back and felt like her back was tight.  She denies any radiation of pain to the bilateral lower extremities.  No numbness/weakness of the legs.  No urinary or bowel incontinence.  No urinary retention, fevers, history of IV drug use or cancer.  She denies any chance of pregnancy.  On exam patient is without any neurologic deficits.  She is ambulatory in the room in no distress.  Will give Rx for Robaxin.  She states there is no chance of pregnancy.  She is requesting a work note.  Have given  her information to follow-up with PCP and return if worse.  She voices understanding of the plan and reasons to return.  All questions answered.  Patient stable for discharge.  Final Clinical Impression(s) / ED Diagnoses Final diagnoses:  Strain of lumbar region, subsequent encounter    Rx / DC Orders ED Discharge Orders         Ordered    methocarbamol (ROBAXIN) 750 MG tablet  At bedtime PRN     04/30/19 1423  Rodney Booze, PA-C 04/30/19 Hagan, Bryant, DO 04/30/19 1615

## 2019-04-30 NOTE — ED Triage Notes (Addendum)
Has been seen x 2 for same pain and states she is still having stinging pain in lower middle back, no incontenence, no new injury just hasn't  gotten better

## 2019-05-13 ENCOUNTER — Other Ambulatory Visit: Payer: Self-pay

## 2019-05-13 ENCOUNTER — Emergency Department (HOSPITAL_COMMUNITY)
Admission: EM | Admit: 2019-05-13 | Discharge: 2019-05-13 | Disposition: A | Discharge status: Home / Self Care | Payer: Self-pay | Attending: Emergency Medicine | Admitting: Emergency Medicine

## 2019-05-13 ENCOUNTER — Encounter (HOSPITAL_COMMUNITY): Payer: Self-pay | Admitting: Emergency Medicine

## 2019-05-13 DIAGNOSIS — R197 Diarrhea, unspecified: Secondary | ICD-10-CM | POA: Insufficient documentation

## 2019-05-13 DIAGNOSIS — R112 Nausea with vomiting, unspecified: Secondary | ICD-10-CM | POA: Insufficient documentation

## 2019-05-13 DIAGNOSIS — Z79899 Other long term (current) drug therapy: Secondary | ICD-10-CM | POA: Insufficient documentation

## 2019-05-13 DIAGNOSIS — F17200 Nicotine dependence, unspecified, uncomplicated: Secondary | ICD-10-CM | POA: Insufficient documentation

## 2019-05-13 LAB — POC URINE PREG, ED: Preg Test, Ur: NEGATIVE

## 2019-05-13 MED ORDER — ONDANSETRON 4 MG PO TBDP
8.0000 mg | ORAL_TABLET | Freq: Once | ORAL | Status: AC
  Administered 2019-05-13: 8 mg via ORAL
  Filled 2019-05-13: qty 2

## 2019-05-13 MED ORDER — ONDANSETRON 4 MG PO TBDP: 4.0000 mg | ORAL_TABLET | Freq: Three times a day (TID) | ORAL | 0 refills | Status: DC | PRN

## 2019-05-13 NOTE — ED Notes (Signed)
Pt dc'd home w/all belongings, a/o x4, ambulatory on dc, drove self home

## 2019-05-13 NOTE — Discharge Instructions (Signed)
Please follow-up with your primary care provider.  If you do not have 1, please establish with [facility].  Please read the attachment provided.  It is important that you replace the fluids that you have lost through vomiting and loose stools.  Please return to the ED or seek immediate medical attention should you develop any fevers or chills, inability to tolerate food or liquid by mouth, uncontrolled nausea or vomiting, or any other new or worsening symptoms.

## 2019-05-13 NOTE — ED Provider Notes (Signed)
[facility] EMERGENCY DEPARTMENT Provider Note   CSN: [id number] Arrival date & time: 05/13/19  0350     History No chief complaint on file.   Brianna Lee is a 29 y.o. female with no significant PMH apparent to the ED with a 7-hour history of nausea, nonbloody emesis, and nonbloody loose stools.  Patient reports that she was eating [place] [employer] late last night with her boyfriend when shortly thereafter she became acutely nauseated and had several episodes of nonbloody emesis.  She was able to go to sleep, however when she woke up this morning to go to work she still felt nauseated prompting her to come to the ED for evaluation.  She also endorses epigastric discomfort when retching.  She denies any recent illness, fevers or chills, known COVID-19 exposure, chest pain or difficulty breathing, or urinary symptoms.  Last menses was first week in January.  HPI     History reviewed. No pertinent past medical history.  There are no problems to display for this patient.   History reviewed. No pertinent surgical history.   OB History   No obstetric history on file.     History reviewed. No pertinent family history.  Social History   Tobacco Use  . Smoking status: Current Every Day Smoker  . Smokeless tobacco: Never Used  Substance Use Topics  . Alcohol use: Yes  . Drug use: Never    Home Medications Prior to Admission medications   Medication Sig Start Date End Date Taking? Authorizing Provider  amoxicillin (AMOXIL) 500 MG capsule Take 1 capsule (500 mg total) by mouth 3 (three) times daily. 12/30/18   Blane Ohara, MD  cyclobenzaprine (FLEXERIL) 5 MG tablet Take 1 tablet (5 mg total) by mouth 3 (three) times daily as needed for muscle spasms. 04/22/19   Ree Shay, MD  HYDROcodone-acetaminophen (NORCO/VICODIN) 5-325 MG tablet Take 1-2 tablets by mouth every 6 (six) hours as needed. 04/13/19   Maxwell Caul, PA-C  ibuprofen (ADVIL) 800 MG tablet Take 800  mg by mouth every 8 (eight) hours as needed.    [provider]  ondansetron (ZOFRAN ODT) 4 MG disintegrating tablet Take 1 tablet (4 mg total) by mouth every 8 (eight) hours as needed for nausea or vomiting. 05/13/19   Lorelee New, PA-C    Allergies    Naproxen  Review of Systems   Review of Systems  Constitutional: Negative for chills and fever.  Respiratory: Negative for cough.   Cardiovascular: Negative for chest pain.  Gastrointestinal: Positive for diarrhea, nausea and vomiting. Negative for abdominal pain.  Genitourinary: Negative for dysuria.    Physical Exam Updated Vital Signs BP 137/81   Pulse (!) 120   Temp 97.6 F (36.4 C) (Oral)   Ht 5\' 4"  (1.626 m)   Wt 95.3 kg   SpO2 100%   BMI 36.05 kg/m   Physical Exam Vitals and nursing note reviewed. Exam conducted with a chaperone present.  Constitutional:      Appearance: Normal appearance. She is not ill-appearing or diaphoretic.  HENT:     Head: Normocephalic and atraumatic.  Eyes:     General: No scleral icterus.    Conjunctiva/sclera: Conjunctivae normal.  Cardiovascular:     Rate and Rhythm: Normal rate and regular rhythm.     Pulses: Normal pulses.     Heart sounds: Normal heart sounds.  Pulmonary:     Effort: Pulmonary effort is normal.     Breath sounds: Normal breath sounds.  Abdominal:     Comments: Soft, nondistended.  Mildly TTP in epigastric region.  No TTP elsewhere.  No overlying skin changes.  No guarding.  No masses appreciated.  Musculoskeletal:     Cervical back: Normal range of motion and neck supple. No rigidity.  Skin:    General: Skin is dry.     Capillary Refill: Capillary refill takes less than 2 seconds.  Neurological:     Mental Status: She is alert and oriented to person, place, and time.     GCS: GCS eye subscore is 4. GCS verbal subscore is 5. GCS motor subscore is 6.  Psychiatric:        Mood and Affect: Mood normal.        Behavior: Behavior normal.         Thought Content: Thought content normal.     ED Results / Procedures / Treatments   Labs (all labs ordered are listed, but only abnormal results are displayed) Labs Reviewed  POC URINE PREG, ED    EKG None  Radiology No results found.  Procedures Procedures (including critical care time)  Medications Ordered in ED Medications  ondansetron (ZOFRAN-ODT) disintegrating tablet 8 mg (8 mg Oral Given 05/13/19 1884)    ED Course  I have reviewed the triage vital signs and the nursing notes.  Pertinent labs & imaging results that were available during my care of the patient were reviewed by me and considered in my medical decision making (see chart for details).    MDM Rules/Calculators/A&P                      Given the brevity of her illness, low suspicion for any electrolyte abnormalities or dehydration at this time.  Patient does not appear to be clinically dehydrated.  Will provide patient with Zofran ODT here in the ED and p.o. challenge prior to discharge.  Do not feel as though laboratory work-up would yield any significant findings.  Urine pregnancy was obtained and is negative.  While patient initially tachycardic, she was retching on my examination and her elevated heart reate resolved with antiemetics.  Pulse was regular and do not feel as though EKG warranted as she denies chest discomfort or palpitations.  Physical examination was benign.  Mild epigastric tenderness to palpation, but likely attributable to her episodes of retching and vomiting.  No epigastric tenderness at rest when not retching.  No RUQ or RLQ tenderness to palpation noted otherwise be concerning for appendicitis or gallbladder etiology.  Patient denies any hematemesis, hematochezia, or melena and have low suspicion for PUD, diverticulitis, perforated viscus, or other acute pathology.  Her reported history is consistent with a gastroenteritis, likely viral etiology.  Will not treat with antibiotics at this  time.  Instead, will send home with Zofran ODT and emphasize the importance of oral hydration.  Discussed why I did not feel as though comprehensive evaluation warranted at this time and patient voiced understanding and agreement.  She reports that this feels the same as her prior episode of gastroenteritis 2 months ago that resolved spontaneously and with conservative management.  On reexamination, patient is resting comfortably and in no acute distress.  Her heart rate is down to 78 and she is tolerating fluids and crackers well.  Plan to discharge patient with Zofran ODT and encourage follow-up with her primary care provider for ongoing evaluation and management.   Final Clinical Impression(s) / ED Diagnoses Final diagnoses:  Nausea, vomiting, and diarrhea  Rx / DC Orders ED Discharge Orders         Ordered    ondansetron (ZOFRAN ODT) 4 MG disintegrating tablet  Every 8 hours PRN     05/13/19 0752           Lorelee New, PA-C 05/13/19 0820    Terrilee Files, MD 05/13/19 [phone]

## 2019-05-13 NOTE — ED Triage Notes (Signed)
Pt  Here from home with c/o n/v/d that started around 10 pm last night , states that it might be that food she ate last night

## 2019-06-08 ENCOUNTER — Inpatient Hospital Stay (HOSPITAL_COMMUNITY)
Admission: EM | Admit: 2019-06-08 | Discharge: 2019-06-08 | Disposition: A | Discharge status: Home / Self Care | Payer: Self-pay | Attending: Family Medicine | Admitting: Family Medicine

## 2019-06-08 ENCOUNTER — Inpatient Hospital Stay (HOSPITAL_COMMUNITY): Payer: Self-pay

## 2019-06-08 ENCOUNTER — Other Ambulatory Visit: Payer: Self-pay

## 2019-06-08 ENCOUNTER — Encounter (HOSPITAL_COMMUNITY): Payer: Self-pay | Admitting: Emergency Medicine

## 2019-06-08 DIAGNOSIS — Z886 Allergy status to analgesic agent status: Secondary | ICD-10-CM | POA: Insufficient documentation

## 2019-06-08 DIAGNOSIS — Z3A01 Less than 8 weeks gestation of pregnancy: Secondary | ICD-10-CM | POA: Insufficient documentation

## 2019-06-08 DIAGNOSIS — R1030 Lower abdominal pain, unspecified: Secondary | ICD-10-CM | POA: Insufficient documentation

## 2019-06-08 DIAGNOSIS — O219 Vomiting of pregnancy, unspecified: Secondary | ICD-10-CM

## 2019-06-08 DIAGNOSIS — O99331 Smoking (tobacco) complicating pregnancy, first trimester: Secondary | ICD-10-CM | POA: Insufficient documentation

## 2019-06-08 DIAGNOSIS — O210 Mild hyperemesis gravidarum: Secondary | ICD-10-CM | POA: Insufficient documentation

## 2019-06-08 DIAGNOSIS — O26891 Other specified pregnancy related conditions, first trimester: Secondary | ICD-10-CM | POA: Insufficient documentation

## 2019-06-08 DIAGNOSIS — R350 Frequency of micturition: Secondary | ICD-10-CM | POA: Insufficient documentation

## 2019-06-08 DIAGNOSIS — Z3491 Encounter for supervision of normal pregnancy, unspecified, first trimester: Secondary | ICD-10-CM

## 2019-06-08 DIAGNOSIS — R109 Unspecified abdominal pain: Secondary | ICD-10-CM

## 2019-06-08 DIAGNOSIS — F17200 Nicotine dependence, unspecified, uncomplicated: Secondary | ICD-10-CM | POA: Insufficient documentation

## 2019-06-08 HISTORY — DX: Scoliosis, unspecified: M41.9

## 2019-06-08 LAB — URINALYSIS, ROUTINE W REFLEX MICROSCOPIC
Bilirubin Urine: NEGATIVE
Glucose, UA: NEGATIVE mg/dL
Hgb urine dipstick: NEGATIVE
Ketones, ur: 20 mg/dL — AB
Nitrite: NEGATIVE
Protein, ur: NEGATIVE mg/dL
Specific Gravity, Urine: 1.019 (ref 1.005–1.030)
Squamous Epithelial / HPF: >50 — ABNORMAL HIGH (ref 0–5)
pH: 6 (ref 5.0–8.0)

## 2019-06-08 LAB — CBC
HCT: 41.1 % (ref 36.0–46.0)
Hemoglobin: 13.7 g/dL (ref 12.0–15.0)
MCH: 27.4 pg (ref 26.0–34.0)
MCHC: 33.3 g/dL (ref 30.0–36.0)
MCV: 82.2 fL (ref 80.0–100.0)
Platelets: 245 10*3/uL (ref 150–400)
RBC: 5 MIL/uL (ref 3.87–5.11)
RDW: 14.2 % (ref 11.5–15.5)
WBC: 6.2 10*3/uL (ref 4.0–10.5)
nRBC: 0 % (ref 0.0–0.2)

## 2019-06-08 LAB — COMPREHENSIVE METABOLIC PANEL
ALT: 19 U/L (ref 0–44)
AST: 17 U/L (ref 15–41)
Albumin: 3.3 g/dL — ABNORMAL LOW (ref 3.5–5.0)
Alkaline Phosphatase: 43 U/L (ref 38–126)
Anion gap: 10 (ref 5–15)
BUN: 8 mg/dL (ref 6–20)
CO2: 21 mmol/L — ABNORMAL LOW (ref 22–32)
Calcium: 8.8 mg/dL — ABNORMAL LOW (ref 8.9–10.3)
Chloride: 104 mmol/L (ref 98–111)
Creatinine, Ser: 0.74 mg/dL (ref 0.44–1.00)
GFR calc Af Amer: >60 mL/min (ref >60)
GFR calc non Af Amer: >60 mL/min (ref >60)
Glucose, Bld: 100 mg/dL — ABNORMAL HIGH (ref 70–99)
Potassium: 3.4 mmol/L — ABNORMAL LOW (ref 3.5–5.1)
Sodium: 135 mmol/L (ref 135–145)
Total Bilirubin: 0.5 mg/dL (ref 0.3–1.2)
Total Protein: 6.9 g/dL (ref 6.5–8.1)

## 2019-06-08 LAB — HCG, QUANTITATIVE, PREGNANCY: hCG, Beta Chain, Quant, S: 34438 m[IU]/mL — ABNORMAL HIGH (ref <5)

## 2019-06-08 LAB — LIPASE, BLOOD: Lipase: 20 U/L (ref 11–51)

## 2019-06-08 LAB — I-STAT BETA HCG BLOOD, ED (MC, WL, AP ONLY): I-stat hCG, quantitative: >2000 m[IU]/mL — ABNORMAL HIGH (ref <5)

## 2019-06-08 MED ORDER — SODIUM CHLORIDE 0.9 % IV BOLUS
1000.0000 mL | Freq: Once | INTRAVENOUS | Status: AC
  Administered 2019-06-08: 1000 mL via INTRAVENOUS

## 2019-06-08 MED ORDER — SODIUM CHLORIDE 0.9% FLUSH
3.0000 mL | Freq: Once | INTRAVENOUS | Status: AC
  Administered 2019-06-08: 09:00:00 3 mL via INTRAVENOUS

## 2019-06-08 MED ORDER — PROMETHAZINE HCL 25 MG PO TABS: 25.0000 mg | ORAL_TABLET | Freq: Four times a day (QID) | ORAL | 0 refills | Status: DC | PRN

## 2019-06-08 MED ORDER — ONDANSETRON HCL 4 MG/2ML IJ SOLN
4.0000 mg | Freq: Once | INTRAMUSCULAR | Status: AC
  Administered 2019-06-08: 09:00:00 4 mg via INTRAVENOUS
  Filled 2019-06-08: qty 2

## 2019-06-08 NOTE — MAU Note (Signed)
Latarsha Zani is a 29 y.o. at [redacted]w[redacted]d here in MAU reporting:  +mid abdominal pain +emesis. 4-5 times yesterday. Stopped around 8am. Endorses that she is unable to keep anything down. +urinary frequency  LMP: 01/20  Onset of complaint: 2-3 days Pain score: denies at this time.   Vitals:   06/08/19 0844 06/08/19 1004  BP: (!) 134/38 122/71  Pulse: (!) 104 78  Resp: 16 16  Temp: 98.3 F (36.8 C) 97.8 F (36.6 C)  SpO2: 100% 100%    Lab orders placed from triage:none.  patient states she had blood work and urine collected in [facility] ED prior to transport. Patient arrived with 0.9% normal saline infusing in Left AC. 20G noted.

## 2019-06-08 NOTE — ED Notes (Signed)
UA cup given for UA sample

## 2019-06-08 NOTE — Discharge Instructions (Signed)
Prenatal Care Providers           [facility] [place]   Phone: [phone]  [facility] [place]   Phone: [phone]  [facility]       Phone: [phone]            [facility] [place]     Phone: [phone]          [facility] [place]   Phone: [phone]  [facility] [place]  Phone: [phone]  [facility] [place] Phone: [phone]     [facility]  Phone: [phone]  [facility]  Phone: [phone]  [facility] Phone: [phone]  [facility] Phone: [phone]  [facility] Phone: [phone]  [facility] Phone: [phone]  [facility]  Phone: [phone]      Morning Sickness  Morning sickness is when a woman feels nauseous during pregnancy. This nauseous feeling may or may not come with vomiting. It often occurs in the morning, but it can be a problem at any time of day. Morning sickness is most common during the first trimester. In some cases, it may continue throughout pregnancy. Although morning sickness is unpleasant, it is usually harmless unless the woman develops severe and continual vomiting (hyperemesis gravidarum), a condition that requires more intense treatment. What are the causes? The exact cause of this condition is not known, but it seems to be related to normal hormonal changes that occur in pregnancy. What increases the risk? You are more likely to develop this condition if:  You experienced nausea or vomiting before your pregnancy.  You had morning sickness during a previous pregnancy.  You are pregnant with more than one baby, such as twins. What are the signs or symptoms? Symptoms of this condition include:  Nausea.  Vomiting. How is this diagnosed? This condition is  usually diagnosed based on your signs and symptoms. How is this treated? In many cases, treatment is not needed for this condition. Making some changes to what you eat may help to control symptoms. Your health care provider may also prescribe or recommend:  Vitamin B6 supplements.  Anti-nausea medicines.  Ginger. Follow these instructions at home: Medicines  Take over-the-counter and prescription medicines only as told by your health care provider. Do not use any prescription, over-the-counter, or herbal medicines for morning sickness without first talking with your health care provider.  Taking multivitamins before getting pregnant can prevent or decrease the severity of morning sickness in most women. Eating and drinking  Eat a piece of dry toast or crackers before getting out of bed in the morning.  Eat 5 or 6 small meals a day.  Eat dry and bland foods, such as rice or a baked potato. Foods that are high in carbohydrates are often helpful.  Avoid greasy, fatty, and spicy foods.  Have someone cook for you if the smell of any food causes nausea and vomiting.  If you feel nauseous after taking prenatal vitamins, take the vitamins at night or with a snack.  Snack on protein foods between meals if you are hungry. Nuts, yogurt, and cheese are good options.  Drink fluids throughout the day.  Try ginger ale made with real ginger, ginger tea made from fresh grated ginger, or ginger candies. General instructions  Do not use any products that contain nicotine or tobacco,   such as cigarettes and e-cigarettes. If you need help quitting, ask your health care provider.  Get an air purifier to keep the air in your house free of odors.  Get plenty of fresh air.  Try to avoid odors that trigger your nausea.  Consider trying these methods to help relieve symptoms: ? Wearing an acupressure wristband. These wristbands are often worn for seasickness. ? Acupuncture. Contact a health care  provider if:  Your home remedies are not working and you need medicine.  You feel dizzy or light-headed.  You are losing weight. Get help right away if:  You have persistent and uncontrolled nausea and vomiting.  You faint.  You have severe pain in your abdomen. Summary  Morning sickness is when a woman feels nauseous during pregnancy. This nauseous feeling may or may not come with vomiting.  Morning sickness is most common during the first trimester.  It often occurs in the morning, but it can be a problem at any time of day.  In many cases, treatment is not needed for this condition. Making some changes to what you eat may help to control symptoms. This information is not intended to replace advice given to you by your health care provider. Make sure you discuss any questions you have with your health care provider. Document Revised: 03/16/2017 Document Reviewed: 05/06/2016 Elsevier Patient Education  2020 [employer] Inc[employer]  

## 2019-06-08 NOTE — ED Provider Notes (Signed)
[facility] Provider Note   CSN: [id number] Arrival date & time: 06/08/19  0867     History Chief Complaint  Patient presents with  . Abdominal Pain    Brianna Lee is a 29 y.o. female.  Presents to ER with complaint of abdominal pain, nausea, vomiting.  Also having some frequent urination.  No dysuria, no hematuria.  States pain is lower, primarily on her left side.  Achy, crampy.  Mild.  Not currently having abdominal pain.  Has been having intermittent nausea and vomiting over the past couple days as well.  Vomit is nonbloody nonbilious.  1 episode today.  Feels quite nauseous at this time.  Last menstrual period January 20, normal.  No vaginal bleeding, no vaginal discharge.  Sexually active.  Was on IUD 2 years ago, not currently on any birth control.  HPI     History reviewed. No pertinent past medical history.  There are no problems to display for this patient.   History reviewed. No pertinent surgical history.   OB History    Gravida  1   Para      Term      Preterm      AB      Living        SAB      TAB      Ectopic      Multiple      Live Births              No family history on file.  Social History   Tobacco Use  . Smoking status: Current Every Day Smoker  . Smokeless tobacco: Never Used  Substance Use Topics  . Alcohol use: Yes  . Drug use: Never    Home Medications Prior to Admission medications   Medication Sig Start Date End Date Taking? Authorizing Provider  cyclobenzaprine (FLEXERIL) 5 MG tablet Take 1 tablet (5 mg total) by mouth 3 (three) times daily as needed for muscle spasms. 04/22/19  Yes Deis, Roselyn Reef, MD  EPINEPHrine 0.3 mg/0.3 mL IJ SOAJ injection Inject 0.3 mg into the muscle See admin instructions. As directed for anaphylaxis 04/11/16  Yes [provider]  HYDROcodone-acetaminophen (NORCO/VICODIN) 5-325 MG tablet Take 1-2 tablets by mouth every 6 (six) hours as needed. 04/13/19  Yes  [place] Lanius A, PA-C  ondansetron (ZOFRAN ODT) 4 MG disintegrating tablet Take 1 tablet (4 mg total) by mouth every 8 (eight) hours as needed for nausea or vomiting. 05/13/19  Yes Corena Herter, PA-C    Allergies    Naproxen  Review of Systems   Review of Systems  Constitutional: Negative for chills and fever.  HENT: Negative for ear pain and sore throat.   Eyes: Negative for pain and visual disturbance.  Respiratory: Negative for cough and shortness of breath.   Cardiovascular: Negative for chest pain and palpitations.  Gastrointestinal: Positive for abdominal pain, nausea and vomiting.  Genitourinary: Negative for dysuria and hematuria.  Musculoskeletal: Negative for arthralgias and back pain.  Skin: Negative for color change and rash.  Neurological: Negative for seizures and syncope.  All other systems reviewed and are negative.   Physical Exam Updated Vital Signs BP 122/71 (BP Location: Right Arm)   Pulse 78   Temp 97.8 F (36.6 C) (Oral)   Resp 16   LMP 05/07/2019   SpO2 100% Comment: room air  Physical Exam Vitals and nursing note reviewed.  Constitutional:      General: She is  not in acute distress.    Appearance: She is well-developed.  HENT:     Head: Normocephalic and atraumatic.  Eyes:     Conjunctiva/sclera: Conjunctivae normal.  Cardiovascular:     Rate and Rhythm: Normal rate and regular rhythm.     Heart sounds: No murmur.  Pulmonary:     Effort: Pulmonary effort is normal. No respiratory distress.     Breath sounds: Normal breath sounds.  Abdominal:     Palpations: Abdomen is soft.     Comments: Mild TTP in suprapubic, LLQ; no rebound guarding, negative murphy's, no pain at [place]  Musculoskeletal:     Cervical back: Neck supple.  Skin:    General: Skin is warm and dry.  Neurological:     Mental Status: She is alert.     ED Results / Procedures / Treatments   Labs (all labs ordered are listed, but only abnormal results are  displayed) Labs Reviewed  COMPREHENSIVE METABOLIC PANEL - Abnormal; Notable for the following components:      Result Value   Potassium 3.4 (*)    CO2 21 (*)    Glucose, Bld 100 (*)    Calcium 8.8 (*)    Albumin 3.3 (*)    All other components within normal limits  URINALYSIS, ROUTINE W REFLEX MICROSCOPIC - Abnormal; Notable for the following components:   APPearance CLOUDY (*)    Ketones, ur 20 (*)    Leukocytes,Ua SMALL (*)    Bacteria, UA RARE (*)    Squamous Epithelial / LPF >50 (*)    All other components within normal limits  I-STAT BETA HCG BLOOD, ED ([facility], [facility], [facility] ONLY) - Abnormal; Notable for the following components:   I-stat hCG, quantitative >2,000.0 (*)    All other components within normal limits  LIPASE, BLOOD  CBC    EKG None  Radiology No results found.  Procedures Procedures (including critical care time)  Medications Ordered in ED Medications  sodium chloride flush (NS) 0.9 % injection 3 mL (3 mLs Intravenous Given 06/08/19 0923)  ondansetron (ZOFRAN) injection 4 mg (4 mg Intravenous Given 06/08/19 0922)  sodium chloride 0.9 % bolus 1,000 mL (1,000 mLs Intravenous Transfusing/Transfer 06/08/19 3474)    ED Course  I have reviewed the triage vital signs and the nursing notes.  Pertinent labs & imaging results that were available during my care of the patient were reviewed by me and considered in my medical decision making (see chart for details).    MDM Rules/Calculators/A&P                      29 year old presents to ER with nausea vomiting and mild lower abdominal pain.  On exam patient is noted to be well-appearing, no focal tenderness appreciated on exam, mild tenderness on left side.  Labs were concerning for positive pregnancy test.  LMP around 4 weeks ago.  Recommend obtaining ultrasound to confirm IUP, rule out ectopic.  Doubt other acute abdominal process given current exam, labs.  Discussed with ultrasound and MAU, will send to [facility]U for OB  ultrasound.  Rare bacteria noted on UA, will defer to OB regarding treatment. Gave zofran here with resolution of nausea.   Final Clinical Impression(s) / ED Diagnoses Final diagnoses:  Pregnancy test positive  Hyperemesis gravidarum    Rx / DC Orders ED Discharge Orders    None       Milagros Loll, MD 06/08/19 1020

## 2019-06-08 NOTE — ED Triage Notes (Signed)
C/o generalized abd pain, nausea, vomiting, lower back pain, and frequent urination x 2 days.

## 2019-06-08 NOTE — MAU Provider Note (Signed)
Chief Complaint: Abdominal Pain   First Provider Initiated Contact with Patient 06/08/19 1043     SUBJECTIVE HPI: Brianna Lee is a 29 y.o. G1P0 at [redacted]w[redacted]d by LMP who presents to [facility] reporting nausea & abdominal pain. Was seen in the [facility] this morning & transferred here for ultrasound. Received IV zofran prior to being transferred & reports resolution of nausea.  Has had mid to lower abdominal pain for the last few days. Pain is intermittent. Nothing makes better or worse. Last BM was 3 days ago but states this is a normal frequency for her. Also reports increased urinary frequency but denies any other urinary complaints. Denies fever/chills, flank pain, vaginal bleeding, or vaginal discharge. Has not started prenatal care.   Location: mid to lower abdomen Quality: cramping Severity: currently 0/10 on pain scale Duration: 2 days Timing: intermittent Modifying factors: none Associated signs and symptoms: nausea/vomiting  Past Medical History:  Diagnosis Date  . Scoliosis    OB History  Gravida Para Term Preterm AB Living  1            SAB TAB Ectopic Multiple Live Births               # Outcome Date GA Lbr Len/2nd Weight Sex Delivery Anes PTL Lv  1 Current            Past Surgical History:  Procedure Laterality Date  . GALLBLADDER SURGERY     Social History   Socioeconomic History  . Marital status: Single    Spouse name: Not on file  . Number of children: Not on file  . Years of education: Not on file  . Highest education level: Not on file  Occupational History  . Not on file  Tobacco Use  . Smoking status: Current Every Day Smoker  . Smokeless tobacco: Never Used  Substance and Sexual Activity  . Alcohol use: Yes  . Drug use: Never  . Sexual activity: Yes    Birth control/protection: None  Other Topics Concern  . Not on file  Social History Narrative  . Not on file   Social Determinants of Health   Financial Resource Strain:   . Difficulty of  Paying Living Expenses: Not on file  Food Insecurity:   . Worried About Programme researcher, broadcasting/film/video in the Last Year: Not on file  . Ran Out of Food in the Last Year: Not on file  Transportation Needs:   . Lack of Transportation (Medical): Not on file  . Lack of Transportation (Non-Medical): Not on file  Physical Activity:   . Days of Exercise per Week: Not on file  . Minutes of Exercise per Session: Not on file  Stress:   . Feeling of Stress : Not on file  Social Connections:   . Frequency of Communication with Friends and Family: Not on file  . Frequency of Social Gatherings with Friends and Family: Not on file  . Attends Religious Services: Not on file  . Active Member of Clubs or Organizations: Not on file  . Attends Banker Meetings: Not on file  . Marital Status: Not on file  Intimate Partner Violence:   . Fear of Current or Ex-Partner: Not on file  . Emotionally Abused: Not on file  . Physically Abused: Not on file  . Sexually Abused: Not on file   History reviewed. No pertinent family history. No current facility-administered medications on file prior to encounter.   Current Outpatient Medications on File  Prior to Encounter  Medication Sig Dispense Refill  . EPINEPHrine 0.3 mg/0.3 mL IJ SOAJ injection Inject 0.3 mg into the muscle See admin instructions. As directed for anaphylaxis     Allergies  Allergen Reactions  . Naproxen Swelling    I have reviewed patient's Past Medical Hx, Surgical Hx, Family Hx, Social Hx, medications and allergies.   Review of Systems  Constitutional: Negative.   Gastrointestinal: Positive for abdominal pain, constipation, nausea and vomiting. Negative for diarrhea.  Genitourinary: Positive for frequency. Negative for dysuria, flank pain, vaginal bleeding and vaginal discharge.    OBJECTIVE Patient Vitals for the past 24 hrs:  BP Temp Temp src Pulse Resp SpO2  06/08/19 1214 96/68 - - - - -  06/08/19 1056 (!) 99/48 98.3 F (36.8  C) Oral 71 16 100 %  06/08/19 1004 122/71 97.8 F (36.6 C) Oral 78 16 100 %  06/08/19 0844 (!) 134/38 98.3 F (36.8 C) Oral (!) 104 16 100 %   Constitutional: Well-developed, well-nourished female in no acute distress.  Cardiovascular: normal rate & rhythm, no murmur Respiratory: normal rate and effort. Lung sounds clear throughout GI: Abd soft, non-tender, Pos BS x 4. No guarding or rebound tenderness MS: Extremities nontender, no edema, normal ROM Neurologic: Alert and oriented x 4.    LAB RESULTS Results for orders placed or performed during the hospital encounter of 06/08/19 (from the past 24 hour(s))  Urinalysis, Routine w reflex microscopic     Status: Abnormal   Collection Time: 06/08/19  8:45 AM  Result Value Ref Range   Color, Urine YELLOW YELLOW   APPearance CLOUDY (A) CLEAR   Specific Gravity, Urine 1.019 1.005 - 1.030   pH 6.0 5.0 - 8.0   Glucose, UA NEGATIVE NEGATIVE mg/dL   Hgb urine dipstick NEGATIVE NEGATIVE   Bilirubin Urine NEGATIVE NEGATIVE   Ketones, ur 20 (A) NEGATIVE mg/dL   Protein, ur NEGATIVE NEGATIVE mg/dL   Nitrite NEGATIVE NEGATIVE   Leukocytes,Ua SMALL (A) NEGATIVE   RBC / HPF 0-5 0 - 5 RBC/hpf   WBC, UA 11-20 0 - 5 WBC/hpf   Bacteria, UA RARE (A) NONE SEEN   Squamous Epithelial / LPF >50 (H) 0 - 5   Mucus PRESENT   Lipase, blood     Status: None   Collection Time: 06/08/19  8:47 AM  Result Value Ref Range   Lipase 20 11 - 51 U/L  Comprehensive metabolic panel     Status: Abnormal   Collection Time: 06/08/19  8:47 AM  Result Value Ref Range   Sodium 135 135 - 145 mmol/L   Potassium 3.4 (L) 3.5 - 5.1 mmol/L   Chloride 104 98 - 111 mmol/L   CO2 21 (L) 22 - 32 mmol/L   Glucose, Bld 100 (H) 70 - 99 mg/dL   BUN 8 6 - 20 mg/dL   Creatinine, Ser 8.25 0.44 - 1.00 mg/dL   Calcium 8.8 (L) 8.9 - 10.3 mg/dL   Total Protein 6.9 6.5 - 8.1 g/dL   Albumin 3.3 (L) 3.5 - 5.0 g/dL   AST 17 15 - 41 U/L   ALT 19 0 - 44 U/L   Alkaline Phosphatase 43 38 -  126 U/L   Total Bilirubin 0.5 0.3 - 1.2 mg/dL   GFR calc non Af Amer >60 >60 mL/min   GFR calc Af Amer >60 >60 mL/min   Anion gap 10 5 - 15  CBC     Status: None   Collection  Time: 06/08/19  8:47 AM  Result Value Ref Range   WBC 6.2 4.0 - 10.5 K/uL   RBC 5.00 3.87 - 5.11 MIL/uL   Hemoglobin 13.7 12.0 - 15.0 g/dL   HCT 41.1 36.0 - 46.0 %   MCV 82.2 80.0 - 100.0 fL   MCH 27.4 26.0 - 34.0 pg   MCHC 33.3 30.0 - 36.0 g/dL   RDW 14.2 11.5 - 15.5 %   Platelets 245 150 - 400 K/uL   nRBC 0.0 0.0 - 0.2 %  hCG, quantitative, pregnancy     Status: Abnormal   Collection Time: 06/08/19  8:47 AM  Result Value Ref Range   hCG, Beta Chain, Quant, S 34,438 (H) <5 mIU/mL  I-Stat beta hCG blood, ED     Status: Abnormal   Collection Time: 06/08/19  8:52 AM  Result Value Ref Range   I-stat hCG, quantitative >2,000.0 (H) <5 mIU/mL   Comment 3            IMAGING US OB LESS THAN 14 WEEKS WITH OB TRANSVAGINAL  Result Date: 06/08/2019 CLINICAL DATA:  Left-sided abdominal pain. Quantitative beta HCG pending. Estimated gestational age per LMP is 4 weeks 4 days. EXAM: OBSTETRIC <14 WK [place] AND TRANSVAGINAL OB US TECHNIQUE: Both transabdominal and transvaginal ultrasound examinations were performed for complete evaluation of the gestation as well as the maternal uterus, adnexal regions, and pelvic cul-de-sac. Transvaginal technique was performed to assess early pregnancy. COMPARISON:  None. FINDINGS: Intrauterine gestational sac: Single visualized. Yolk sac:  Visualized. Embryo:  Visualized Cardiac Activity: Visualized. Heart Rate: 135 bpm MSD: 16.2 mm   6 w   3 d CRL:  9.37 mm   6 w   6 d                  [place] EDC: 01/26/2020 Subchorionic hemorrhage: Oval mildly complicated fluid collection adjacent to the gestational sac as this could represent a moderate size subchorionic hemorrhage, although may represent a second gestational sac without well-defined embryo or yolk sac. Maternal uterus/adnexae: Ovaries are normal  size, shape and position with normal color Doppler bilaterally. Small to moderate amount of free simple pelvic fluid is present. IMPRESSION: 1. Single live IUP with estimated gestational age [redacted] weeks 6 days. Immediately adjacent oval complicated fluid collection which may represent subchorionic hemorrhage versus a second gestational sac without well-defined oval yolk sac or embryo. Recommend follow-up ultrasound 10-14 days. Electronically Signed   By: Marin Olp M.D.   On: 06/08/2019 11:57    MAU COURSE Orders Placed This Encounter  Procedures  . Culture, OB Urine  . US OB LESS THAN 14 WEEKS WITH OB TRANSVAGINAL  . Lipase, blood  . Comprehensive metabolic panel  . CBC  . Urinalysis, Routine w reflex microscopic  . hCG, quantitative, pregnancy  . Diet NPO time specified  . Saline Lock IV, Maintain IV access  . I-Stat beta hCG blood, ED  . Discharge patient   Meds ordered this encounter  Medications  . sodium chloride flush (NS) 0.9 % injection 3 mL  . ondansetron (ZOFRAN) injection 4 mg  . sodium chloride 0.9 % bolus 1,000 mL  . promethazine (PHENERGAN) 25 MG tablet    Sig: Take 1 tablet (25 mg total) by mouth every 6 (six) hours as needed for nausea or vomiting.    Dispense:  30 tablet    Refill:  0    Order Specific Question:   Supervising Provider    Answer:  Reva Bores [[id number]]    MDM Ultrasound shows live IUP measuring [redacted]w[redacted]d, edd updated.   Pt stable. Will d/c home with rx phenergan. Given list of ob providers & instructed to start prenatal care  ASSESSMENT 1. Normal IUP (intrauterine pregnancy) on prenatal ultrasound, first trimester   2. Abdominal pain during pregnancy in first trimester   3. Nausea and vomiting during pregnancy prior to [redacted] weeks gestation     PLAN Discharge home in stable condition. Discussed reasons to return to MAU Rx phenergan Start prenatal care   Allergies as of 06/08/2019      Reactions   Naproxen Swelling      Medication List     STOP taking these medications   cyclobenzaprine 5 MG tablet Commonly known as: FLEXERIL   HYDROcodone-acetaminophen 5-325 MG tablet Commonly known as: NORCO/VICODIN   ondansetron 4 MG disintegrating tablet Commonly known as: Zofran ODT     TAKE these medications   EPINEPHrine 0.3 mg/0.3 mL Soaj injection Commonly known as: EPI-PEN Inject 0.3 mg into the muscle See admin instructions. As directed for anaphylaxis   promethazine 25 MG tablet Commonly known as: PHENERGAN Take 1 tablet (25 mg total) by mouth every 6 (six) hours as needed for nausea or vomiting.        [name], NP 06/08/2019  12:49 PM

## 2019-06-10 LAB — CULTURE, OB URINE

## 2019-06-12 ENCOUNTER — Inpatient Hospital Stay (HOSPITAL_COMMUNITY): Payer: Self-pay

## 2019-06-12 ENCOUNTER — Inpatient Hospital Stay (HOSPITAL_COMMUNITY)
Admission: AD | Admit: 2019-06-12 | Discharge: 2019-06-12 | Disposition: A | Discharge status: Home / Self Care | Payer: Self-pay | Attending: Obstetrics and Gynecology | Admitting: Obstetrics and Gynecology

## 2019-06-12 ENCOUNTER — Other Ambulatory Visit: Payer: Self-pay

## 2019-06-12 ENCOUNTER — Encounter (HOSPITAL_COMMUNITY): Payer: Self-pay

## 2019-06-12 ENCOUNTER — Ambulatory Visit (HOSPITAL_COMMUNITY)
Admission: EM | Admit: 2019-06-12 | Discharge: 2019-06-12 | Disposition: A | Discharge status: Home / Self Care | Payer: Self-pay | Attending: Internal Medicine | Admitting: Internal Medicine

## 2019-06-12 DIAGNOSIS — O26891 Other specified pregnancy related conditions, first trimester: Secondary | ICD-10-CM | POA: Insufficient documentation

## 2019-06-12 DIAGNOSIS — O418X10 Other specified disorders of amniotic fluid and membranes, first trimester, not applicable or unspecified: Secondary | ICD-10-CM

## 2019-06-12 DIAGNOSIS — Z3A01 Less than 8 weeks gestation of pregnancy: Secondary | ICD-10-CM | POA: Insufficient documentation

## 2019-06-12 DIAGNOSIS — O4690 Antepartum hemorrhage, unspecified, unspecified trimester: Secondary | ICD-10-CM

## 2019-06-12 DIAGNOSIS — O209 Hemorrhage in early pregnancy, unspecified: Secondary | ICD-10-CM

## 2019-06-12 DIAGNOSIS — M549 Dorsalgia, unspecified: Secondary | ICD-10-CM | POA: Insufficient documentation

## 2019-06-12 DIAGNOSIS — Z886 Allergy status to analgesic agent status: Secondary | ICD-10-CM | POA: Insufficient documentation

## 2019-06-12 DIAGNOSIS — M419 Scoliosis, unspecified: Secondary | ICD-10-CM | POA: Insufficient documentation

## 2019-06-12 DIAGNOSIS — Z87891 Personal history of nicotine dependence: Secondary | ICD-10-CM | POA: Insufficient documentation

## 2019-06-12 DIAGNOSIS — O208 Other hemorrhage in early pregnancy: Secondary | ICD-10-CM | POA: Insufficient documentation

## 2019-06-12 DIAGNOSIS — O468X1 Other antepartum hemorrhage, first trimester: Secondary | ICD-10-CM

## 2019-06-12 LAB — URINALYSIS, ROUTINE W REFLEX MICROSCOPIC
Bilirubin Urine: NEGATIVE
Glucose, UA: NEGATIVE mg/dL
Ketones, ur: 80 mg/dL — AB
Leukocytes,Ua: NEGATIVE
Nitrite: NEGATIVE
Protein, ur: NEGATIVE mg/dL
Specific Gravity, Urine: 1.027 (ref 1.005–1.030)
pH: 6 (ref 5.0–8.0)

## 2019-06-12 NOTE — Discharge Instructions (Signed)

## 2019-06-12 NOTE — MAU Note (Signed)
Went to [facility] at 1700 and had bright blood on tissue when wiped. Then my lower back started "stinging". Still have lower back stinging.

## 2019-06-12 NOTE — MAU Provider Note (Signed)
History     CSN: [id number]  Arrival date and time: 06/12/19 1851   First Provider Initiated Contact with Patient 06/12/19 1950      Chief Complaint  Patient presents with  . Vaginal Bleeding   HPI [name] is a 29 y.o. G1P0 at [redacted]w[redacted]d who presents to MAU today with complaint of vaginal bleeding. The patient noted bleeding with wiping and a small spot in her underwear tonight. She denies abdominal pain, but is having some "stinging" pain in her back. She has not taken anything for pain today. She states pain is 6/10 at the worst. She denies recent intercourse, fever, N/V/D or constipation or UTI symptoms.   OB History    Gravida  1   Para      Term      Preterm      AB      Living        SAB      TAB      Ectopic      Multiple      Live Births              Past Medical History:  Diagnosis Date  . Scoliosis     Past Surgical History:  Procedure Laterality Date  . GALLBLADDER SURGERY      Family History  Problem Relation Age of Onset  . Hypertension Father   . Diabetes Father     Social History   Tobacco Use  . Smoking status: Former Games developer  . Smokeless tobacco: Never Used  . Tobacco comment: quit while pregnant  Substance Use Topics  . Alcohol use: Not Currently  . Drug use: Never    Allergies:  Allergies  Allergen Reactions  . Naproxen Swelling    Medications Prior to Admission  Medication Sig Dispense Refill Last Dose  . EPINEPHrine 0.3 mg/0.3 mL IJ SOAJ injection Inject 0.3 mg into the muscle See admin instructions. As directed for anaphylaxis     . promethazine (PHENERGAN) 25 MG tablet Take 1 tablet (25 mg total) by mouth every 6 (six) hours as needed for nausea or vomiting. 30 tablet 0     Review of Systems  Constitutional: Negative for fever.  Gastrointestinal: Negative for abdominal pain, constipation, diarrhea, nausea and vomiting.  Genitourinary: Positive for vaginal bleeding. Negative for dysuria, frequency, urgency  and vaginal discharge.  Musculoskeletal: Positive for back pain.   Physical Exam   Blood pressure 128/80, pulse 70, temperature 97.8 F (36.6 C), resp. rate 18, height 5\' 4"  (1.626 m), weight 96.2 kg, last menstrual period 05/07/2019.  Physical Exam  Nursing note and vitals reviewed. Constitutional: She is oriented to person, place, and time. She appears well-developed and well-nourished. No distress.  HENT:  Head: Normocephalic and atraumatic.  Cardiovascular: Normal rate.  Respiratory: Effort normal.  GI: Soft. She exhibits no distension and no mass. There is no abdominal tenderness. There is no rebound, no guarding and no CVA tenderness.  Genitourinary: Cervix exhibits no motion tenderness, no discharge and no friability.    Vaginal bleeding (small, brown) present.     No vaginal discharge.  There is bleeding (small, brown) in the vagina.  Neurological: She is alert and oriented to person, place, and time.  Skin: Skin is warm and dry. No erythema.  Psychiatric: She has a normal mood and affect.  Cervix: closed, thick  Results for orders placed or performed during the hospital encounter of 06/12/19 (from the past 24 hour(s))  Urinalysis, Routine  w reflex microscopic     Status: Abnormal   Collection Time: 06/12/19  7:55 PM  Result Value Ref Range   Color, Urine YELLOW YELLOW   APPearance CLEAR CLEAR   Specific Gravity, Urine 1.027 1.005 - 1.030   pH 6.0 5.0 - 8.0   Glucose, UA NEGATIVE NEGATIVE mg/dL   Hgb urine dipstick LARGE (A) NEGATIVE   Bilirubin Urine NEGATIVE NEGATIVE   Ketones, ur 80 (A) NEGATIVE mg/dL   Protein, ur NEGATIVE NEGATIVE mg/dL   Nitrite NEGATIVE NEGATIVE   Leukocytes,Ua NEGATIVE NEGATIVE   RBC / HPF 6-10 0 - 5 RBC/hpf   WBC, UA 0-5 0 - 5 WBC/hpf   Bacteria, UA RARE (A) NONE SEEN   Squamous Epithelial / LPF 0-5 0 - 5   Mucus PRESENT    US OB Transvaginal  Result Date: 06/12/2019 CLINICAL DATA:  Vaginal bleeding EXAM: TRANSVAGINAL OB ULTRASOUND  TECHNIQUE: Transvaginal ultrasound was performed for complete evaluation of the gestation as well as the maternal uterus, adnexal regions, and pelvic cul-de-sac. COMPARISON:  06/08/2019 FINDINGS: Intrauterine gestational sac: Single Yolk sac:  Visualized. Embryo:  Visualized. Cardiac Activity: Visualized. Heart Rate: 153 bpm CRL:   14.4 mm   7 w 5 d                  [place] EDC: 01/24/2020 Subchorionic hemorrhage: Subchorionic hemorrhage is seen along the inferior margin of the gestational sac measuring 2.4 x 0.9 x 2.4 cm. Maternal uterus/adnexae: Right ovary measures 3.5 x 2.5 x 2.3 cm and left ovary measures 3.1 x 1.6 x 1.6 cm. Likely corpus luteum cyst within the right ovary. IMPRESSION: 1. Single live intrauterine pregnancy as above, estimated age 97 weeks and 5 days. 2. Subchorionic hemorrhage along the inferior margin of the gestational sac. Electronically Signed   By: Randa Ngo M.D.   On: 06/12/2019 20:41   US OB LESS THAN 14 WEEKS WITH OB TRANSVAGINAL  Result Date: 06/08/2019 CLINICAL DATA:  Left-sided abdominal pain. Quantitative beta HCG pending. Estimated gestational age per LMP is 4 weeks 4 days. EXAM: OBSTETRIC <14 WK [place] AND TRANSVAGINAL OB US TECHNIQUE: Both transabdominal and transvaginal ultrasound examinations were performed for complete evaluation of the gestation as well as the maternal uterus, adnexal regions, and pelvic cul-de-sac. Transvaginal technique was performed to assess early pregnancy. COMPARISON:  None. FINDINGS: Intrauterine gestational sac: Single visualized. Yolk sac:  Visualized. Embryo:  Visualized Cardiac Activity: Visualized. Heart Rate: 135 bpm MSD: 16.2 mm   6 w   3 d CRL:  9.37 mm   6 w   6 d                  [place] EDC: 01/26/2020 Subchorionic hemorrhage: Oval mildly complicated fluid collection adjacent to the gestational sac as this could represent a moderate size subchorionic hemorrhage, although may represent a second gestational sac without well-defined embryo or yolk  sac. Maternal uterus/adnexae: Ovaries are normal size, shape and position with normal color Doppler bilaterally. Small to moderate amount of free simple pelvic fluid is present. IMPRESSION: 1. Single live IUP with estimated gestational age [redacted] weeks 6 days. Immediately adjacent oval complicated fluid collection which may represent subchorionic hemorrhage versus a second gestational sac without well-defined oval yolk sac or embryo. Recommend follow-up ultrasound 10-14 days. Electronically Signed   By: Marin Olp M.D.   On: 06/08/2019 11:57     MAU Course  Procedures Pt informed that the ultrasound is considered a limited OB ultrasound and  is not intended to be a complete ultrasound exam.  Patient also informed that the ultrasound is not being completed with the intent of assessing for fetal or placental anomalies or any pelvic abnormalities.  Explained that the purpose of today's ultrasound is to assess for  viability.  Patient acknowledges the purpose of the exam and the limitations of the study. Gestation too early to detect FHR with transabdominal US.   MDM UA and [place] today   Assessment and Plan  A: SIUP at [redacted]w[redacted]d Subchorionic hemorrhage   P: Discharge home Bleeding precautions and pelvic rest discussed Patient advised to follow-up with OB provider of choice. List given.  Patient may return to MAU as needed or if her condition were to change or worsen  Vonzella Nipple, PA-C 06/12/2019, 8:55 PM

## 2019-06-12 NOTE — ED Triage Notes (Signed)
Patient presents to Urgent Care with complaints of vaginal bleeding since an hour ago. Patient reports she is 7 weeks and two days pregnant; this is the pt's first pregnancy and she is scared something is wrong with the pregnancy.

## 2019-06-12 NOTE — ED Notes (Signed)
Patient sent to [facility] MAU for further evaluation per Dr. Leonides Grills.

## 2019-06-12 NOTE — ED Provider Notes (Signed)
[facility]    CSN: [id number] Arrival date & time: 06/12/19  1808      History   Chief Complaint Chief Complaint  Patient presents with  . Vaginal Bleeding    [redacted] weeks pregnant    HPI Brianna Lee is a 29 y.o. female currently pregnant comes to urgent care with complaints of vaginal bleeding which she noticed this morning.  She used the bathroom at work and noticed blood when she wiped.  She denies any nausea or vomiting.  She had some sharp pain in her lower back and mild discomfort in the suprapubic region.  She came to the urgent care to be evaluated.   HPI  Past Medical History:  Diagnosis Date  . Scoliosis     There are no problems to display for this patient.   Past Surgical History:  Procedure Laterality Date  . GALLBLADDER SURGERY      OB History    Gravida  1   Para      Term      Preterm      AB      Living        SAB      TAB      Ectopic      Multiple      Live Births               Home Medications    Prior to Admission medications   Medication Sig Start Date End Date Taking? Authorizing Provider  EPINEPHrine 0.3 mg/0.3 mL IJ SOAJ injection Inject 0.3 mg into the muscle See admin instructions. As directed for anaphylaxis 04/11/16   [provider]  promethazine (PHENERGAN) 25 MG tablet Take 1 tablet (25 mg total) by mouth every 6 (six) hours as needed for nausea or vomiting. 06/08/19   [name], NP    Family History Family History  Problem Relation Age of Onset  . Hypertension Father   . Diabetes Father     Social History Social History   Tobacco Use  . Smoking status: Former Games developer  . Smokeless tobacco: Never Used  . Tobacco comment: quit while pregnant  Substance Use Topics  . Alcohol use: Not Currently  . Drug use: Never     Allergies   Naproxen   Review of Systems Review of Systems  Constitutional: Negative for activity change, chills and fatigue.  Gastrointestinal: Positive for  abdominal pain. Negative for nausea and vomiting.  Genitourinary: Positive for vaginal bleeding. Negative for vaginal discharge and vaginal pain.  Neurological: Negative for dizziness, light-headedness and headaches.     Physical Exam Triage Vital Signs ED Triage Vitals  Enc Vitals Group     BP 06/12/19 1820 131/70     Pulse Rate 06/12/19 1820 68     Resp 06/12/19 1820 18     Temp 06/12/19 1820 98.5 F (36.9 C)     Temp Source 06/12/19 1820 Oral     SpO2 06/12/19 1820 100 %     Weight --      Height --      Head Circumference --      Peak Flow --      Pain Score 06/12/19 1819 4     Pain Loc --      Pain Edu? --      Excl. in GC? --    No data found.  Updated Vital Signs BP 131/70 (BP Location: Left Arm)   Pulse 68   Temp  98.5 F (36.9 C) (Oral)   Resp 18   LMP 05/07/2019   SpO2 100%   Visual Acuity Right Eye Distance:   Left Eye Distance:   Bilateral Distance:    Right Eye Near:   Left Eye Near:    Bilateral Near:     Physical Exam Constitutional:      General: She is not in acute distress.    Appearance: Normal appearance.  Cardiovascular:     Rate and Rhythm: Normal rate and regular rhythm.  Pulmonary:     Effort: Pulmonary effort is normal. No respiratory distress.     Breath sounds: Normal breath sounds. No wheezing or rhonchi.  Abdominal:     General: Bowel sounds are normal. There is no distension.     Tenderness: There is no guarding or rebound.  Skin:    Capillary Refill: Capillary refill takes less than 2 seconds.  Neurological:     Mental Status: She is alert.  Psychiatric:        Mood and Affect: Mood normal.      UC Treatments / Results  Labs (all labs ordered are listed, but only abnormal results are displayed) Labs Reviewed - No data to display  EKG   Radiology No results found.  Procedures Procedures (including critical care time)  Medications Ordered in UC Medications - No data to display  Initial Impression /  Assessment and Plan / UC Course  I have reviewed the triage vital signs and the nursing notes.  Pertinent labs & imaging results that were available during my care of the patient were reviewed by me and considered in my medical decision making (see chart for details).     1.  Vaginal bleeding in first trimester: Patient is advised to go to the MAU for further evaluation She will need transvaginal ultrasound to evaluate the bleeding.  Patient verbalized understanding and she will go to the MAU for further evaluation. Final Clinical Impressions(s) / UC Diagnoses   Final diagnoses:  Vaginal bleeding in pregnancy   Discharge Instructions   None    ED Prescriptions    None     PDMP not reviewed this encounter.   Chase Picket, MD 06/13/19 [phone]

## 2019-06-18 ENCOUNTER — Inpatient Hospital Stay (HOSPITAL_COMMUNITY): Payer: Medicaid Other

## 2019-06-18 ENCOUNTER — Encounter (HOSPITAL_COMMUNITY): Payer: Self-pay | Admitting: Obstetrics & Gynecology

## 2019-06-18 ENCOUNTER — Other Ambulatory Visit: Payer: Self-pay

## 2019-06-18 ENCOUNTER — Inpatient Hospital Stay (HOSPITAL_COMMUNITY)
Admission: EM | Admit: 2019-06-18 | Discharge: 2019-06-18 | Disposition: A | Discharge status: Home / Self Care | Payer: Medicaid Other | Attending: Obstetrics & Gynecology | Admitting: Obstetrics & Gynecology

## 2019-06-18 DIAGNOSIS — O209 Hemorrhage in early pregnancy, unspecified: Secondary | ICD-10-CM

## 2019-06-18 DIAGNOSIS — O468X1 Other antepartum hemorrhage, first trimester: Secondary | ICD-10-CM

## 2019-06-18 DIAGNOSIS — Z87891 Personal history of nicotine dependence: Secondary | ICD-10-CM | POA: Diagnosis not present

## 2019-06-18 DIAGNOSIS — Z3A08 8 weeks gestation of pregnancy: Secondary | ICD-10-CM | POA: Diagnosis not present

## 2019-06-18 DIAGNOSIS — O208 Other hemorrhage in early pregnancy: Secondary | ICD-10-CM | POA: Insufficient documentation

## 2019-06-18 DIAGNOSIS — O418X10 Other specified disorders of amniotic fluid and membranes, first trimester, not applicable or unspecified: Secondary | ICD-10-CM

## 2019-06-18 LAB — URINALYSIS, ROUTINE W REFLEX MICROSCOPIC
Bacteria, UA: NONE SEEN
Bilirubin Urine: NEGATIVE
Glucose, UA: NEGATIVE mg/dL
Ketones, ur: NEGATIVE mg/dL
Leukocytes,Ua: NEGATIVE
Nitrite: NEGATIVE
Protein, ur: NEGATIVE mg/dL
Specific Gravity, Urine: 1.008 (ref 1.005–1.030)
pH: 7 (ref 5.0–8.0)

## 2019-06-18 LAB — ABO/RH: ABO/RH(D): O POS

## 2019-06-18 MED ORDER — ACETAMINOPHEN 500 MG PO TABS
1000.0000 mg | ORAL_TABLET | Freq: Once | ORAL | Status: AC
  Administered 2019-06-18: 1000 mg via ORAL
  Filled 2019-06-18: qty 2

## 2019-06-18 NOTE — MAU Note (Signed)
Pt presents to MAU from ED due to vaginal bleeding that started today. She has had intermittent spotting prior to today but noticed much more blood when wiping. She has also had lower abdominal pain and lower back pain that started today as well.

## 2019-06-18 NOTE — MAU Provider Note (Signed)
History     CSN: [id number]  Arrival date and time: 06/18/19 1244   First Provider Initiated Contact with Patient 06/18/19 1437      Chief Complaint  Patient presents with  . Vaginal Bleeding   HPI   [name] is a 29 y.o. female here in MAU with vaginal bleeding. She has a confirmed SIUP with a subchorionic hemorrhage. She states she had small amount of spotting at the beginning of pregnancy however none since then. She started having heavy vaginl bleeding today and changing her pads frequently. She has had some lower abdominal cramping that comes and goes.   OB History    Gravida  1   Para      Term      Preterm      AB      Living        SAB      TAB      Ectopic      Multiple      Live Births              Past Medical History:  Diagnosis Date  . Scoliosis     Past Surgical History:  Procedure Laterality Date  . GALLBLADDER SURGERY      Family History  Problem Relation Age of Onset  . Hypertension Father   . Diabetes Father     Social History   Tobacco Use  . Smoking status: Former Games developer  . Smokeless tobacco: Never Used  . Tobacco comment: quit while pregnant  Substance Use Topics  . Alcohol use: Not Currently  . Drug use: Never    Allergies:  Allergies  Allergen Reactions  . Naproxen Swelling    Medications Prior to Admission  Medication Sig Dispense Refill Last Dose  . Prenatal Vit-Fe Fumarate-FA (MULTIVITAMIN-PRENATAL) 27-0.8 MG TABS tablet Take 1 tablet by mouth daily at 12 noon.   06/18/2019 at Unknown time  . promethazine (PHENERGAN) 25 MG tablet Take 1 tablet (25 mg total) by mouth every 6 (six) hours as needed for nausea or vomiting. 30 tablet 0 06/17/2019 at Unknown time  . EPINEPHrine 0.3 mg/0.3 mL IJ SOAJ injection Inject 0.3 mg into the muscle See admin instructions. As directed for anaphylaxis   Unknown at Unknown time   Results for orders placed or performed during the hospital encounter of 06/18/19 (from the past  48 hour(s))  Urinalysis, Routine w reflex microscopic     Status: Abnormal   Collection Time: 06/18/19  1:21 PM  Result Value Ref Range   Color, Urine STRAW (A) YELLOW   APPearance CLEAR CLEAR   Specific Gravity, Urine 1.008 1.005 - 1.030   pH 7.0 5.0 - 8.0   Glucose, UA NEGATIVE NEGATIVE mg/dL   Hgb urine dipstick LARGE (A) NEGATIVE   Bilirubin Urine NEGATIVE NEGATIVE   Ketones, ur NEGATIVE NEGATIVE mg/dL   Protein, ur NEGATIVE NEGATIVE mg/dL   Nitrite NEGATIVE NEGATIVE   Leukocytes,Ua NEGATIVE NEGATIVE   RBC / HPF 21-50 0 - 5 RBC/hpf   WBC, UA 0-5 0 - 5 WBC/hpf   Bacteria, UA NONE SEEN NONE SEEN   Squamous Epithelial / LPF 0-5 0 - 5    Comment: Performed at [facility], [address], [place], [place] [address]  ABO/Rh     Status: None   Collection Time: 06/18/19  2:51 PM  Result Value Ref Range   ABO/RH(D) O POS    No rh immune globuloin  NOT A RH IMMUNE GLOBULIN CANDIDATE, PT RH POSITIVE Performed at [facility], [place] [address], [place], [place] [address]    [place] Transvaginal  Result Date: 06/18/2019 CLINICAL DATA:  Vaginal bleeding in 1st trimester pregnancy. Assigned GA currently 8 weeks 4 days by prior ultrasound. EXAM: TRANSVAGINAL OB ULTRASOUND TECHNIQUE: Transvaginal ultrasound was performed for complete evaluation of the gestation as well as the maternal uterus, adnexal regions, and pelvic cul-de-sac. COMPARISON:  06/12/2019 FINDINGS: Intrauterine gestational sac: Single Yolk sac:  Visualized. Embryo:  Visualized. Cardiac Activity: Visualized. Heart Rate: 167 bpm CRL:   20 mm   8 w 3 d                  [place] EDC: 01/25/2020 Subchorionic hemorrhage: Small subchorionic hemorrhage shows no significant change compared to prior study. Maternal uterus/adnexae: Retroflexed uterus again noted. Normal appearance of both ovaries. No mass or abnormal free fluid identified. IMPRESSION: Single living IUP with assigned gestational age of [redacted] weeks 4 days by prior ultrasound.  Appropriate interval growth. Small subchorionic hemorrhage, without significant change. Electronically Signed   By: Marlaine Hind M.D.   On: 06/18/2019 15:38    Review of Systems  Gastrointestinal: Positive for abdominal pain.  Genitourinary: Positive for vaginal bleeding. Negative for pelvic pain.   Physical Exam   Blood pressure 112/62, pulse 65, temperature 98.1 F (36.7 C), resp. rate 20, last menstrual period 05/07/2019, SpO2 100 %.  Physical Exam  Constitutional: She is oriented to person, place, and time. She appears well-developed and well-nourished. No distress.  HENT:  Head: Normocephalic.  Eyes: Pupils are equal, round, and reactive to light.  Respiratory: Effort normal. No respiratory distress.  GI: Soft. She exhibits no distension. There is no abdominal tenderness. There is no rebound.  Genitourinary:    Genitourinary Comments: Cervix closed, anterior. Small amount of dark blood noted on exam glove.    Musculoskeletal:        General: Normal range of motion.  Neurological: She is alert and oriented to person, place, and time.  Skin: Skin is warm. She is not diaphoretic.  Psychiatric: Her behavior is normal.    MAU Course  Procedures  Pt informed that the ultrasound is considered a limited OB ultrasound and is not intended to be a complete ultrasound exam.  Patient also informed that the ultrasound is not being completed with the intent of assessing for fetal or placental anomalies or any pelvic abnormalities.  Explained that the purpose of today's ultrasound is to assess for  viability.  Patient acknowledges the purpose of the exam and the limitations of the study.     MDM  O positive blood type.  D/t body habitus; unable to confirm viability with bedside US. Patient sent to radiology for official US Tylenol given 1 gram PO   Assessment and Plan   A:  1. Subchorionic hemorrhage of placenta in first trimester, single or unspecified fetus   2. Vaginal bleeding  in pregnancy, first trimester   3. [redacted] weeks gestation of pregnancy     P:  Discharge home in stable condition Patient feels reassured Bleeding precautions Pelvic rest  Sasha Rogel, Artist Pais, NP 06/19/2019 1:32 PM

## 2019-06-18 NOTE — ED Provider Notes (Signed)
MSE was initiated and I personally evaluated the patient and placed orders (if any) at  12:51 PM on June 18, 2019.  The patient appears stable so that the remainder of the MSE may be completed by another provider.   Patient presents with an episode of vaginal bleeding that began shortly prior to arrival.  She states she is [redacted] weeks pregnant with her first pregnancy.  She has had intermittent spotting before today, but today while using the bathroom she noticed much more blood on the paper and some blood in her underwear.  She has cramping pain to the lower abdomen, flanks, and lower back bilaterally.   Physical exam: No diaphoresis.  No pallor.  Pulmonary: No increased work of breathing.  Speaks in full sentences without difficulty.  No tachypnea.  Cardiac: Normal rate and regular.   Abdominal: Tenderness to the lower abdomen, into the bilateral flanks.  No peritoneal signs.  No rebound tenderness.  No guarding.     Clinical Course as of Jun 17 1301  Wed Jun 18, 2019  1301 Spoke with Raelyn Mora, CNM, at [facility].  Agrees to accept the patient.   [SJ]    Clinical Course User Index [SJ] De Blanch 06/18/19 1303    Linwood Dibbles, MD 06/18/19 1900

## 2019-06-18 NOTE — Discharge Instructions (Signed)

## 2019-06-24 ENCOUNTER — Encounter (HOSPITAL_COMMUNITY): Payer: Self-pay

## 2019-06-24 ENCOUNTER — Other Ambulatory Visit: Payer: Self-pay

## 2019-06-24 ENCOUNTER — Emergency Department (HOSPITAL_COMMUNITY)
Admission: EM | Admit: 2019-06-24 | Discharge: 2019-06-24 | Disposition: A | Discharge status: Home / Self Care | Payer: Medicaid Other | Attending: Emergency Medicine | Admitting: Emergency Medicine

## 2019-06-24 DIAGNOSIS — O219 Vomiting of pregnancy, unspecified: Secondary | ICD-10-CM | POA: Diagnosis not present

## 2019-06-24 DIAGNOSIS — O2311 Infections of bladder in pregnancy, first trimester: Secondary | ICD-10-CM | POA: Insufficient documentation

## 2019-06-24 DIAGNOSIS — O99891 Other specified diseases and conditions complicating pregnancy: Secondary | ICD-10-CM | POA: Diagnosis not present

## 2019-06-24 DIAGNOSIS — R1111 Vomiting without nausea: Secondary | ICD-10-CM | POA: Diagnosis not present

## 2019-06-24 DIAGNOSIS — Z87891 Personal history of nicotine dependence: Secondary | ICD-10-CM | POA: Insufficient documentation

## 2019-06-24 DIAGNOSIS — Z3A09 9 weeks gestation of pregnancy: Secondary | ICD-10-CM | POA: Diagnosis not present

## 2019-06-24 DIAGNOSIS — N3090 Cystitis, unspecified without hematuria: Secondary | ICD-10-CM

## 2019-06-24 DIAGNOSIS — R110 Nausea: Secondary | ICD-10-CM | POA: Diagnosis not present

## 2019-06-24 DIAGNOSIS — R52 Pain, unspecified: Secondary | ICD-10-CM | POA: Diagnosis not present

## 2019-06-24 DIAGNOSIS — I10 Essential (primary) hypertension: Secondary | ICD-10-CM | POA: Diagnosis not present

## 2019-06-24 DIAGNOSIS — R42 Dizziness and giddiness: Secondary | ICD-10-CM | POA: Diagnosis not present

## 2019-06-24 LAB — URINALYSIS, ROUTINE W REFLEX MICROSCOPIC
Bilirubin Urine: NEGATIVE
Glucose, UA: NEGATIVE mg/dL
Ketones, ur: 80 mg/dL — AB
Nitrite: NEGATIVE
Protein, ur: NEGATIVE mg/dL
Specific Gravity, Urine: 1.024 (ref 1.005–1.030)
pH: 6 (ref 5.0–8.0)

## 2019-06-24 LAB — CBC WITH DIFFERENTIAL/PLATELET
Abs Immature Granulocytes: 0.02 10*3/uL (ref 0.00–0.07)
Basophils Absolute: 0 10*3/uL (ref 0.0–0.1)
Basophils Relative: 0 %
Eosinophils Absolute: 0 10*3/uL (ref 0.0–0.5)
Eosinophils Relative: 0 %
HCT: 38.7 % (ref 36.0–46.0)
Hemoglobin: 12.5 g/dL (ref 12.0–15.0)
Immature Granulocytes: 0 %
Lymphocytes Relative: 21 %
Lymphs Abs: 1.3 10*3/uL (ref 0.7–4.0)
MCH: 26.9 pg (ref 26.0–34.0)
MCHC: 32.3 g/dL (ref 30.0–36.0)
MCV: 83.2 fL (ref 80.0–100.0)
Monocytes Absolute: 0.6 10*3/uL (ref 0.1–1.0)
Monocytes Relative: 10 %
Neutro Abs: 4.3 10*3/uL (ref 1.7–7.7)
Neutrophils Relative %: 69 %
Platelets: 255 10*3/uL (ref 150–400)
RBC: 4.65 MIL/uL (ref 3.87–5.11)
RDW: 14 % (ref 11.5–15.5)
WBC: 6.2 10*3/uL (ref 4.0–10.5)
nRBC: 0 % (ref 0.0–0.2)

## 2019-06-24 LAB — COMPREHENSIVE METABOLIC PANEL
ALT: 22 U/L (ref 0–44)
AST: 19 U/L (ref 15–41)
Albumin: 3 g/dL — ABNORMAL LOW (ref 3.5–5.0)
Alkaline Phosphatase: 38 U/L (ref 38–126)
Anion gap: 5 (ref 5–15)
BUN: 8 mg/dL (ref 6–20)
CO2: 24 mmol/L (ref 22–32)
Calcium: 8.4 mg/dL — ABNORMAL LOW (ref 8.9–10.3)
Chloride: 107 mmol/L (ref 98–111)
Creatinine, Ser: 0.65 mg/dL (ref 0.44–1.00)
GFR calc Af Amer: >60 mL/min (ref >60)
GFR calc non Af Amer: >60 mL/min (ref >60)
Glucose, Bld: 81 mg/dL (ref 70–99)
Potassium: 3.9 mmol/L (ref 3.5–5.1)
Sodium: 136 mmol/L (ref 135–145)
Total Bilirubin: 0.5 mg/dL (ref 0.3–1.2)
Total Protein: 6.2 g/dL — ABNORMAL LOW (ref 6.5–8.1)

## 2019-06-24 LAB — LIPASE, BLOOD: Lipase: 18 U/L (ref 11–51)

## 2019-06-24 MED ORDER — CEPHALEXIN 500 MG PO CAPS: 500.0000 mg | ORAL_CAPSULE | Freq: Four times a day (QID) | ORAL | 0 refills | Status: AC

## 2019-06-24 MED ORDER — SODIUM CHLORIDE 0.9 % IV SOLN: Freq: Once | INTRAVENOUS | Status: AC

## 2019-06-24 MED ORDER — SODIUM CHLORIDE 0.9 % IV SOLN
1.0000 g | Freq: Once | INTRAVENOUS | Status: AC
  Administered 2019-06-24: 12:00:00 1 g via INTRAVENOUS
  Filled 2019-06-24: qty 10

## 2019-06-24 MED ORDER — PROMETHAZINE HCL 25 MG PO TABS: 25.0000 mg | ORAL_TABLET | Freq: Four times a day (QID) | ORAL | 0 refills | Status: DC | PRN

## 2019-06-24 NOTE — Discharge Instructions (Signed)
Please take Keflex 4 times daily for the next 5 days.  This is an antibiotic that will treat your urinary tract infection.  Please follow-up with your OB/GYN in order to ensure that your urinary tract infection is resolved.  Please call to make an appointment.  Please return to ED if you have any new or concerning symptoms.

## 2019-06-24 NOTE — ED Provider Notes (Signed)
Medical screening examination/treatment/procedure(s) were performed by non-physician practitioner and as supervising physician I was immediately available for consultation/collaboration.   EKG Normal sinus rhythm rate 75 Normal intervals Normal ST-T waves No prior EKG for comparison   Linwood Dibbles, MD 06/24/19 1042

## 2019-06-24 NOTE — ED Provider Notes (Signed)
[facility]-EMERGENCY DEPT Provider Note   CSN: [id number] Arrival date & time: 06/24/19  4287     History Chief Complaint  Patient presents with  . Nausea  . Emesis During Pregnancy    Brianna Lee is a 29 y.o. female.  HPI Patient is a 29 year old female with no significant past medical history presented today for single episode of nausea, lightheadedness, 1 episode of nonbloody nonbilious vomiting.  She states this occurred briefly after she arrived at work this morning.  She states she did not eat or drink anything this morning.  She states that she is also somewhat weak but did not pass out.  She denies any vision changes or loss of consciousness, denies any chest pain, shortness of breath, heart palpitations, denies any abdominal pain.  She states that her symptoms completely resolved before arrival in emergency department.  She denies any nausea at this time.  Patient is [redacted] weeks pregnant and states is her first pregnancy.  She is uncertain whether her mother or sister has had morning sickness during her pregnancies.  She denies any cough, congestion, fevers or chills.  She states that she was recently evaluated at MAU for subchorionic hemorrhage.  She had ultrasound that was reassuring and was discharged.  Endorses pelvic pressure for 2 days.  Denies any dysuria or urgency.  No vaginal discharge or bleeding.    Past Medical History:  Diagnosis Date  . Scoliosis     There are no problems to display for this patient.   Past Surgical History:  Procedure Laterality Date  . GALLBLADDER SURGERY       OB History    Gravida  1   Para      Term      Preterm      AB      Living        SAB      TAB      Ectopic      Multiple      Live Births              Family History  Problem Relation Age of Onset  . Hypertension Father   . Diabetes Father     Social History   Tobacco Use  . Smoking status: Former Games developer  . Smokeless tobacco:  Never Used  . Tobacco comment: quit while pregnant  Substance Use Topics  . Alcohol use: Not Currently  . Drug use: Never    Home Medications Prior to Admission medications   Medication Sig Start Date End Date Taking? Authorizing Provider  EPINEPHrine 0.3 mg/0.3 mL IJ SOAJ injection Inject 0.3 mg into the muscle See admin instructions. As directed for anaphylaxis 04/11/16  Yes [provider]  Prenatal Vit-Fe Fumarate-FA (MULTIVITAMIN-PRENATAL) 27-0.8 MG TABS tablet Take 1 tablet by mouth daily at 12 noon.   Yes [provider]  cephALEXin (KEFLEX) 500 MG capsule Take 1 capsule (500 mg total) by mouth 4 (four) times daily for 5 days. 06/24/19 06/29/19  Gailen Shelter, PA  promethazine (PHENERGAN) 25 MG tablet Take 1 tablet (25 mg total) by mouth every 6 (six) hours as needed for nausea or vomiting. 06/24/19   Gailen Shelter, PA    Allergies    Naproxen  Review of Systems   Review of Systems  Constitutional: Negative for chills and fever.  HENT: Negative for congestion.   Eyes: Negative for pain.  Respiratory: Negative for cough and shortness of breath.   Cardiovascular:  Negative for chest pain and leg swelling.  Gastrointestinal: Positive for nausea and vomiting. Negative for abdominal pain.  Genitourinary: Negative for dysuria.  Musculoskeletal: Negative for myalgias.  Skin: Negative for rash.  Neurological: Positive for light-headedness. Negative for headaches.    Physical Exam Updated Vital Signs BP 121/84   Pulse 81   Temp 98.4 F (36.9 C) (Oral)   Resp 17   Ht 5\' 4"  (1.626 m)   Wt 96.2 kg   LMP 05/07/2019   SpO2 100%   BMI 36.39 kg/m   Physical Exam Vitals and nursing note reviewed.  Constitutional:      General: She is not in acute distress.    Comments: Patient is well-appearing 29 year old female no acute distress sitting comfortably in bed, able answer questions appropriately follow commands.  HENT:     Head: Normocephalic and atraumatic.      Nose: Nose normal.     Mouth/Throat:     Mouth: Mucous membranes are moist.  Eyes:     General: No scleral icterus. Cardiovascular:     Rate and Rhythm: Normal rate and regular rhythm.     Pulses: Normal pulses.     Heart sounds: Normal heart sounds.  Pulmonary:     Effort: Pulmonary effort is normal. No respiratory distress.     Breath sounds: No wheezing.  Abdominal:     Palpations: Abdomen is soft.     Tenderness: There is no abdominal tenderness. There is no right CVA tenderness, left CVA tenderness, guarding or rebound.     Comments: Soft non-TTP abdomen.   Musculoskeletal:     Cervical back: Normal range of motion.     Right lower leg: No edema.     Left lower leg: No edema.  Skin:    General: Skin is warm and dry.     Capillary Refill: Capillary refill takes less than 2 seconds.  Neurological:     Mental Status: She is alert. Mental status is at baseline.  Psychiatric:        Mood and Affect: Mood normal.        Behavior: Behavior normal.     ED Results / Procedures / Treatments   Labs (all labs ordered are listed, but only abnormal results are displayed) Labs Reviewed  URINALYSIS, ROUTINE W REFLEX MICROSCOPIC - Abnormal; Notable for the following components:      Result Value   APPearance HAZY (*)    Hgb urine dipstick MODERATE (*)    Ketones, ur 80 (*)    Leukocytes,Ua SMALL (*)    Bacteria, UA RARE (*)    All other components within normal limits  COMPREHENSIVE METABOLIC PANEL - Abnormal; Notable for the following components:   Calcium 8.4 (*)    Total Protein 6.2 (*)    Albumin 3.0 (*)    All other components within normal limits  URINE CULTURE  CBC WITH DIFFERENTIAL/PLATELET  LIPASE, BLOOD    EKG None  Radiology No results found.  Procedures Procedures (including critical care time)  Medications Ordered in ED Medications  cefTRIAXone (ROCEPHIN) 1 g in sodium chloride 0.9 % 100 mL IVPB (1 g Intravenous New Bag/Given 06/24/19 1141)  0.9 %   sodium chloride infusion ( Intravenous New Bag/Given 06/24/19 1000)    ED Course  I have reviewed the triage vital signs and the nursing notes.  Pertinent labs & imaging results that were available during my care of the patient were reviewed by me and considered in my medical decision  making (see chart for details).   Clinical Course as of Jun 23 1157  Tue Jun 24, 2019  0928 Discussed case with Junie Panning of MAU [id number] the utility of ultrasound   [WF]    Clinical Course User Index [WF] Tedd Sias, [place]   Her the utility of ultrasound or quantitative hCG at this time.   On repeat exam patient with no pain. Feels somwhat sleepy. Has received   normal saline.  Her vital signs are within normal limits.  Shared decision-making assessment patient is regarding urine results. There is small leuks and bacteria present. With pelvic pressure on hx. Discussed with patient she would prefer to have abx at this time and follow up with OBGYN for repeat urine.   CBC, CMP and lipase within normal limits.  Urine culture pending.  EKG notable for sinus rhythm without any abnormality.  Will receive 1g rocephin.       --- The medical records were personally reviewed by myself. I personally reviewed all lab results and interpreted all imaging studies and either concurred with their official read or contacted radiology for clarification.   This patient appears reasonably screened and I doubt any other medical condition requiring further workup, evaluation, or treatment in the ED at this time prior to discharge.   Patient's vitals are WNL apart from vital sign abnormalities discussed above, patient is in NAD, and able to ambulate in the ED at their baseline and able to tolerate PO.  Pain has been managed or a plan has been made for home management and has no complaints prior to discharge. Patient is comfortable with above plan and for discharge at this time. All questions were answered prior to  disposition. Results from the ER workup discussed with the patient face to face and all questions answered to the best of my ability. The patient is safe for discharge with strict return precautions. Patient appears safe for discharge with appropriate follow-up. Conveyed my impression with the patient and they voiced understanding and are agreeable to plan.   An After Visit Summary was printed and given to the patient.  Portions of this note were generated with [employer]. Dictation errors may occur despite best attempts at proofreading.    MDM Rules/Calculators/A&P                     Final Clinical Impression(s) / ED Diagnoses Final diagnoses:  Cystitis  Cystitis of pregnancy in first trimester  Nausea and vomiting during pregnancy    Rx / DC Orders ED Discharge Orders         Ordered    cephALEXin (KEFLEX) 500 MG capsule  4 times daily     06/24/19 1125    promethazine (PHENERGAN) 25 MG tablet  Every 6 hours PRN     06/24/19 1135           Tedd Sias, [place] 06/24/19 1159    Dorie Rank, MD 06/25/19 [phone]

## 2019-06-24 NOTE — ED Triage Notes (Signed)
Pt arrives [facility] from home with complaints of nausea and vomiting. Pt is [redacted] weeks pregnant. Pt reports she went to work today, states " I got really hot and then threw up" Pt denies chest pain, SHOB. Pt has a hx of anxiety. EMS reports no vomiting noted, only a moderate amount of saliva noted in basin. Pt is alert, oriented and ambulatory at this time.

## 2019-06-25 LAB — URINE CULTURE

## 2019-07-14 ENCOUNTER — Inpatient Hospital Stay (HOSPITAL_COMMUNITY): Payer: Medicaid Other

## 2019-07-14 ENCOUNTER — Other Ambulatory Visit: Payer: Self-pay

## 2019-07-14 ENCOUNTER — Telehealth: Payer: Self-pay | Admitting: General Practice

## 2019-07-14 ENCOUNTER — Encounter (HOSPITAL_COMMUNITY): Payer: Self-pay | Admitting: Emergency Medicine

## 2019-07-14 ENCOUNTER — Emergency Department (HOSPITAL_COMMUNITY)
Admission: EM | Admit: 2019-07-14 | Discharge: 2019-07-14 | Disposition: A | Discharge status: Home / Self Care | Payer: Medicaid Other | Attending: Emergency Medicine | Admitting: Emergency Medicine

## 2019-07-14 ENCOUNTER — Emergency Department (HOSPITAL_COMMUNITY): Payer: Medicaid Other

## 2019-07-14 ENCOUNTER — Encounter (HOSPITAL_COMMUNITY): Payer: Self-pay | Admitting: Obstetrics & Gynecology

## 2019-07-14 ENCOUNTER — Inpatient Hospital Stay (EMERGENCY_DEPARTMENT_HOSPITAL)
Admission: AD | Admit: 2019-07-14 | Discharge: 2019-07-14 | Disposition: A | Discharge status: Home / Self Care | Payer: Medicaid Other | Source: Home / Self Care | Attending: Obstetrics and Gynecology | Admitting: Obstetrics and Gynecology

## 2019-07-14 DIAGNOSIS — Z79899 Other long term (current) drug therapy: Secondary | ICD-10-CM | POA: Diagnosis not present

## 2019-07-14 DIAGNOSIS — O039 Complete or unspecified spontaneous abortion without complication: Secondary | ICD-10-CM

## 2019-07-14 DIAGNOSIS — O034 Incomplete spontaneous abortion without complication: Secondary | ICD-10-CM | POA: Diagnosis not present

## 2019-07-14 DIAGNOSIS — R1030 Lower abdominal pain, unspecified: Secondary | ICD-10-CM | POA: Diagnosis not present

## 2019-07-14 DIAGNOSIS — Z87891 Personal history of nicotine dependence: Secondary | ICD-10-CM | POA: Insufficient documentation

## 2019-07-14 DIAGNOSIS — R102 Pelvic and perineal pain: Secondary | ICD-10-CM | POA: Diagnosis not present

## 2019-07-14 DIAGNOSIS — O26891 Other specified pregnancy related conditions, first trimester: Secondary | ICD-10-CM | POA: Diagnosis present

## 2019-07-14 DIAGNOSIS — Z3A12 12 weeks gestation of pregnancy: Secondary | ICD-10-CM | POA: Insufficient documentation

## 2019-07-14 DIAGNOSIS — O219 Vomiting of pregnancy, unspecified: Secondary | ICD-10-CM | POA: Insufficient documentation

## 2019-07-14 DIAGNOSIS — Z886 Allergy status to analgesic agent status: Secondary | ICD-10-CM | POA: Insufficient documentation

## 2019-07-14 DIAGNOSIS — O26899 Other specified pregnancy related conditions, unspecified trimester: Secondary | ICD-10-CM

## 2019-07-14 DIAGNOSIS — Z3A00 Weeks of gestation of pregnancy not specified: Secondary | ICD-10-CM | POA: Diagnosis not present

## 2019-07-14 LAB — URINALYSIS, ROUTINE W REFLEX MICROSCOPIC
Bilirubin Urine: NEGATIVE
Glucose, UA: NEGATIVE mg/dL
Ketones, ur: NEGATIVE mg/dL
Leukocytes,Ua: NEGATIVE
Nitrite: NEGATIVE
Protein, ur: 100 mg/dL — AB
RBC / HPF: >50 RBC/hpf — ABNORMAL HIGH (ref 0–5)
Specific Gravity, Urine: 1.021 (ref 1.005–1.030)
pH: 6 (ref 5.0–8.0)

## 2019-07-14 LAB — CBC WITH DIFFERENTIAL/PLATELET
Abs Immature Granulocytes: 0.03 10*3/uL (ref 0.00–0.07)
Basophils Absolute: 0 10*3/uL (ref 0.0–0.1)
Basophils Relative: 0 %
Eosinophils Absolute: 0 10*3/uL (ref 0.0–0.5)
Eosinophils Relative: 0 %
HCT: 43.8 % (ref 36.0–46.0)
Hemoglobin: 14.2 g/dL (ref 12.0–15.0)
Immature Granulocytes: 0 %
Lymphocytes Relative: 28 %
Lymphs Abs: 2.2 10*3/uL (ref 0.7–4.0)
MCH: 27.3 pg (ref 26.0–34.0)
MCHC: 32.4 g/dL (ref 30.0–36.0)
MCV: 84.1 fL (ref 80.0–100.0)
Monocytes Absolute: 0.7 10*3/uL (ref 0.1–1.0)
Monocytes Relative: 9 %
Neutro Abs: 4.9 10*3/uL (ref 1.7–7.7)
Neutrophils Relative %: 63 %
Platelets: 298 10*3/uL (ref 150–400)
RBC: 5.21 MIL/uL — ABNORMAL HIGH (ref 3.87–5.11)
RDW: 13.6 % (ref 11.5–15.5)
WBC: 7.9 10*3/uL (ref 4.0–10.5)
nRBC: 0 % (ref 0.0–0.2)

## 2019-07-14 LAB — COMPREHENSIVE METABOLIC PANEL
ALT: 27 U/L (ref 0–44)
AST: 22 U/L (ref 15–41)
Albumin: 3.3 g/dL — ABNORMAL LOW (ref 3.5–5.0)
Alkaline Phosphatase: 51 U/L (ref 38–126)
Anion gap: 9 (ref 5–15)
BUN: 10 mg/dL (ref 6–20)
CO2: 25 mmol/L (ref 22–32)
Calcium: 8.9 mg/dL (ref 8.9–10.3)
Chloride: 102 mmol/L (ref 98–111)
Creatinine, Ser: 0.75 mg/dL (ref 0.44–1.00)
GFR calc Af Amer: >60 mL/min (ref >60)
GFR calc non Af Amer: >60 mL/min (ref >60)
Glucose, Bld: 102 mg/dL — ABNORMAL HIGH (ref 70–99)
Potassium: 3.4 mmol/L — ABNORMAL LOW (ref 3.5–5.1)
Sodium: 136 mmol/L (ref 135–145)
Total Bilirubin: 0.4 mg/dL (ref 0.3–1.2)
Total Protein: 7.4 g/dL (ref 6.5–8.1)

## 2019-07-14 LAB — CBC
HCT: 39.2 % (ref 36.0–46.0)
Hemoglobin: 13 g/dL (ref 12.0–15.0)
MCH: 27.5 pg (ref 26.0–34.0)
MCHC: 33.2 g/dL (ref 30.0–36.0)
MCV: 82.9 fL (ref 80.0–100.0)
Platelets: 294 10*3/uL (ref 150–400)
RBC: 4.73 MIL/uL (ref 3.87–5.11)
RDW: 13.3 % (ref 11.5–15.5)
WBC: 7.9 10*3/uL (ref 4.0–10.5)
nRBC: 0 % (ref 0.0–0.2)

## 2019-07-14 LAB — HCG, QUANTITATIVE, PREGNANCY: hCG, Beta Chain, Quant, S: 2395 m[IU]/mL — ABNORMAL HIGH (ref <5)

## 2019-07-14 MED ORDER — OXYCODONE HCL 5 MG PO TABS: 10.0000 mg | ORAL_TABLET | ORAL | 0 refills | Status: DC | PRN

## 2019-07-14 MED ORDER — OXYCODONE HCL 5 MG PO TABS
10.0000 mg | ORAL_TABLET | Freq: Once | ORAL | Status: AC
  Administered 2019-07-14: 10 mg via ORAL
  Filled 2019-07-14: qty 2

## 2019-07-14 MED ORDER — ONDANSETRON 4 MG PO TBDP
4.0000 mg | ORAL_TABLET | Freq: Once | ORAL | Status: AC
  Administered 2019-07-14: 4 mg via ORAL
  Filled 2019-07-14: qty 1

## 2019-07-14 MED ORDER — MISOPROSTOL 200 MCG PO TABS
800.0000 ug | ORAL_TABLET | Freq: Once | ORAL | Status: AC
  Administered 2019-07-14: 800 ug via BUCCAL
  Filled 2019-07-14: qty 4

## 2019-07-14 MED ORDER — PROMETHAZINE HCL 25 MG PO TABS: 12.5000 mg | ORAL_TABLET | Freq: Four times a day (QID) | ORAL | 0 refills | Status: DC | PRN

## 2019-07-14 NOTE — Discharge Instructions (Signed)
Miscarriage A miscarriage is the loss of an unborn baby (fetus) before the 20th week of pregnancy. Most miscarriages happen during the first 3 months of pregnancy. Sometimes, a miscarriage can happen before a woman knows that she is pregnant. Having a miscarriage can be an emotional experience. If you have had a miscarriage, talk with your health care provider about any questions you may have about miscarrying, the grieving process, and your plans for future pregnancy. What are the causes? A miscarriage may be caused by:  Problems with the genes or chromosomes of the fetus. These problems make it impossible for the baby to develop normally. They are often the result of random errors that occur early in the development of the baby, and are not passed from parent to child (not inherited).  Infection of the cervix or uterus.  Conditions that affect hormone balance in the body.  Problems with the cervix, such as the cervix opening and thinning before pregnancy is at term (cervical insufficiency).  Problems with the uterus. These may include: ? A uterus with an abnormal shape. ? Fibroids in the uterus. ? Congenital abnormalities. These are problems that were present at birth.  Certain medical conditions.  Smoking, drinking alcohol, or using drugs.  Injury (trauma). In many cases, the cause of a miscarriage is not known. What are the signs or symptoms? Symptoms of this condition include:  Vaginal bleeding or spotting, with or without cramps or pain.  Pain or cramping in the abdomen or lower back.  Passing fluid, tissue, or blood clots from the vagina. How is this diagnosed? This condition may be diagnosed based on:  A physical exam.  Ultrasound.  Blood tests.  Urine tests. How is this treated? Treatment for a miscarriage is sometimes not necessary if you naturally pass all the tissue that was in your uterus. If necessary, this condition may be treated with:  Dilation and  curettage (D&C). This is a procedure in which the cervix is stretched open and the lining of the uterus (endometrium) is scraped. This is done only if tissue from the fetus or placenta remains in the body (incomplete miscarriage).  Medicines, such as: ? Antibiotic medicine, to treat infection. ? Medicine to help the body pass any remaining tissue. ? Medicine to reduce (contract) the size of the uterus. These medicines may be given if you have a lot of bleeding. If you have Rh negative blood and your baby was Rh positive, you will need a shot of a medicine called Rh immunoglobulinto protect your future babies from Rh blood problems. "Rh-negative" and "Rh-positive" refer to whether or not the blood has a specific protein found on the surface of red blood cells (Rh factor). Follow these instructions at home: Medicines   Take over-the-counter and prescription medicines only as told by your health care provider.  If you were prescribed antibiotic medicine, take it as told by your health care provider. Do not stop taking the antibiotic even if you start to feel better.  Do not take NSAIDs, such as aspirin and ibuprofen, unless they are approved by your health care provider. These medicines can cause bleeding. Activity  Rest as directed. Ask your health care provider what activities are safe for you.  Have someone help with home and family responsibilities during this time. General instructions  Keep track of the number of sanitary pads you use each day and how soaked (saturated) they are. Write down this information.  Monitor the amount of tissue or blood  clots that you pass from your vagina. Save any large amounts of tissue for your health care provider to examine.  Do not use tampons, douche, or have sex until your health care provider approves.  To help you and your partner with the process of grieving, talk with your health care provider or seek counseling.  When you are ready, meet with  your health care provider to discuss any important steps you should take for your health. Also, discuss steps you should take to have a healthy pregnancy in the future.  Keep all follow-up visits as told by your health care provider. This is important. Where to find more information  The American Congress of Obstetricians and Gynecologists: www.acog.org  [place] Department of Health and Cytogeneticist of Womens Health: http://hoffman.com/ Contact a health care provider if:  You have a fever or chills.  You have a foul smelling vaginal discharge.  You have more bleeding instead of less. Get help right away if:  You have severe cramps or pain in your back or abdomen.  You pass blood clots or tissue from your vagina that is walnut-sized or larger.  You soak more than 1 regular sanitary pad in an hour.  You become light-headed or weak.  You pass out.  You have feelings of sadness that take over your thoughts, or you have thoughts of hurting yourself. Summary  Most miscarriages happen in the first 3 months of pregnancy. Sometimes miscarriage happens before a woman even knows that she is pregnant.  Follow your health care provider's instruction for home care. Keep all follow-up appointments.  To help you and your partner with the process of grieving, talk with your health care provider or seek counseling. This information is not intended to replace advice given to you by your health care provider. Make sure you discuss any questions you have with your health care provider. Document Revised: 07/26/2018 Document Reviewed: 05/09/2016 Elsevier Patient Education  2020 [employer]  FACTS YOU SHOULD KNOW  WHAT IS AN EARLY PREGNANCY FAILURE? Once the egg is fertilized with the sperm and begins to develop, it attaches to the lining of the uterus. This early pregnancy tissue may not develop into an embryo (the beginning stage of a baby). Sometimes an embryo does develop but does  not continue to grow. These problems can be seen on ultrasound.   Medicine (CYTOTEC)  The complete procedure may take days to weeks  No Surgery  Bleeding may be heavy at times  There may be drug side effects  Patient has more control Waiting  You may choose to wait, in which case your own body may complete the passing of the abnormal early pregnancy on its own in about 2-4 weeks  Your bleeding may be heavy at times  There is a small possibility that you may need surgery if the bleeding is too much or not all of the pregnancy has passed. CYTOTEC MANAGEMENT Prostaglandins (cytotec) are the most widely used drug for this purpose. They cause the uterus to cramp and contract. Empting of the uterus should occur within 3 days but the process may continue for several weeks. The bleeding may seem heavy at times. POSSIBLE SIDE EFFECTS FROM CYTOTEC  Nausea   Vomiting  Diarrhea Fever  Chills  Hot Flashes Side effects  from the process of the early pregnancy failure include:  Cramping  Bleeding  Headaches  Dizziness RISKS: This is a low risk procedure. Less than 1 in 100 women has a complication.  An incomplete passage of the early pregnancy may occur. Also, Hemorrhage (heavy bleeding) could happen.  Rarely the pregnancy will not be passed completely. Excessively heavy bleeding may occur.  Your doctor may need to perform surgery to empty the uterus (D&E). Afterwards: Everybody will feel differently after the early pregnancy completion. You may have soreness or cramps for a day or two. You may have soreness or cramps for day or two.  You may have light bleeding for up to 2 weeks. You may be as active as you feel like being. If you have any of the following problems you may call Maternity Admissions Unit at [phone].  If you have pain that does not get better  with pain medication  Bleeding that soaks through 2 thick full-sized sanitary pads in an hour  Cramps that last longer than 2  days  Foul smelling discharge  Fever above 100.4 degrees F Even if you do not have any of these symptoms, you should have a follow-up exam to make sure you are healing properly. This appointment will be made for you before you leave the hospital. Your next normal period will start again in 4-6 week after the loss. You can get pregnant soon after the loss, so use birth control right away. Finally: Make sure all your questions are answered before during and after any procedure. Follow up with medical care and family planning methods.

## 2019-07-14 NOTE — ED Triage Notes (Signed)
Patient is complaining of abdominal pain. Patient is [redacted] weeks pregnant. Patient is vomiting and feeling nauseas. Patient can not tell me when she started hurting.

## 2019-07-14 NOTE — Discharge Instructions (Signed)
Please go to [facility]U immediately for follow up. Please do not hesitate to return to the emergency department with any new or worsening symptoms.

## 2019-07-14 NOTE — ED Provider Notes (Signed)
[facility]-EMERGENCY DEPT Provider Note   CSN: [id number] Arrival date & time: 07/14/19  0405     History Chief Complaint  Patient presents with  . Routine Prenatal Visit  . Abdominal Pain    Brianna Lee is a 29 y.o. female.  HPI HPI Comments: Brianna Lee is a 29 y.o. female G1P0000 who presents to the Emergency Department complaining of sudden onset, moderate, diffuse lower abdominal pain that began 1 hour ago.  Patient states that she woke with her abdominal pain and came to the emergency department.  She describes it as a diffuse cramping pain.  Upon arrival she became nauseated and vomited multiple times.  While undressing in the room she noticed a small amount of blood on her underwear but is unsure of how much blood or whether or not there were any clots present.  She denies any fevers, chills, chest pain, shortness of breath, urinary symptoms.     Past Medical History:  Diagnosis Date  . Scoliosis     There are no problems to display for this patient.   Past Surgical History:  Procedure Laterality Date  . GALLBLADDER SURGERY       OB History    Gravida  1   Para      Term      Preterm      AB      Living        SAB      TAB      Ectopic      Multiple      Live Births              Family History  Problem Relation Age of Onset  . Hypertension Father   . Diabetes Father     Social History   Tobacco Use  . Smoking status: Former Games developer  . Smokeless tobacco: Never Used  . Tobacco comment: quit while pregnant  Substance Use Topics  . Alcohol use: Not Currently  . Drug use: Never    Home Medications Prior to Admission medications   Medication Sig Start Date End Date Taking? Authorizing Provider  EPINEPHrine 0.3 mg/0.3 mL IJ SOAJ injection Inject 0.3 mg into the muscle See admin instructions. As directed for anaphylaxis 04/11/16   [provider]  Prenatal Vit-Fe Fumarate-FA (MULTIVITAMIN-PRENATAL) 27-0.8  MG TABS tablet Take 1 tablet by mouth daily at 12 noon.    [provider]  promethazine (PHENERGAN) 25 MG tablet Take 1 tablet (25 mg total) by mouth every 6 (six) hours as needed for nausea or vomiting. 06/24/19   Gailen Shelter, PA    Allergies    Naproxen  Review of Systems   Review of Systems  Constitutional: Negative for chills and fever.  Respiratory: Negative for shortness of breath.   Cardiovascular: Negative for chest pain and leg swelling.  Gastrointestinal: Positive for abdominal pain, nausea and vomiting. Negative for diarrhea.  Genitourinary: Positive for pelvic pain and vaginal bleeding. Negative for dysuria, hematuria and vaginal discharge.  All other systems reviewed and are negative.   Physical Exam Updated Vital Signs BP 115/89 (BP Location: Left Arm)   Pulse 87   Temp 99 F (37.2 C) (Oral)   Resp 20   Ht 5\' 4"  (1.626 m)   Wt 96.2 kg   LMP 05/07/2019   SpO2 100%   BMI 36.39 kg/m   Physical Exam Vitals and nursing note reviewed.  Constitutional:      General: She is  in acute distress.     Appearance: She is well-developed. She is obese. She is not ill-appearing, toxic-appearing or diaphoretic.  HENT:     Head: Normocephalic and atraumatic.  Eyes:     Extraocular Movements: Extraocular movements intact.  Cardiovascular:     Rate and Rhythm: Normal rate and regular rhythm.     Heart sounds: Normal heart sounds. No murmur. No friction rub. No gallop.   Pulmonary:     Effort: Pulmonary effort is normal. No respiratory distress.     Breath sounds: Normal breath sounds. No stridor. No wheezing, rhonchi or rales.  Abdominal:     General: Abdomen is protuberant. There is no distension. There are no signs of injury.     Palpations: Abdomen is soft.     Tenderness: There is abdominal tenderness in the right lower quadrant, suprapubic area and left lower quadrant. There is no right CVA tenderness, left CVA tenderness, guarding or rebound.    Genitourinary:    Comments: Pelvic exam performed with female nursing chaperone present. Blood noted overlying the vulvar region. Normal appearing vulvar anatomy. Normal appearing vaginal mucosa. Blood noted in the vaginal vault without signs of clots or products of conception. Unable to visualize the cervical os.  Skin:    General: Skin is warm and dry.     Capillary Refill: Capillary refill takes less than 2 seconds.  Neurological:     General: No focal deficit present.     Mental Status: She is alert and oriented to person, place, and time.  Psychiatric:        Mood and Affect: Mood normal.        Behavior: Behavior normal.    ED Results / Procedures / Treatments   Labs (all labs ordered are listed, but only abnormal results are displayed) Labs Reviewed  URINALYSIS, ROUTINE W REFLEX MICROSCOPIC - Abnormal; Notable for the following components:      Result Value   Color, Urine YELLOW (*)    APPearance CLOUDY (*)    Hgb urine dipstick LARGE (*)    Protein, ur 100 (*)    RBC / HPF >50 (*)    Bacteria, UA RARE (*)    All other components within normal limits  COMPREHENSIVE METABOLIC PANEL - Abnormal; Notable for the following components:   Potassium 3.4 (*)    Glucose, Bld 102 (*)    Albumin 3.3 (*)    All other components within normal limits  CBC WITH DIFFERENTIAL/PLATELET - Abnormal; Notable for the following components:   RBC 5.21 (*)    All other components within normal limits  HCG, QUANTITATIVE, PREGNANCY - Abnormal; Notable for the following components:   hCG, Beta Chain, Quant, S 2,395 (*)    All other components within normal limits  URINE CULTURE    EKG None  Radiology US OB Comp < 14 Wks  Result Date: 07/14/2019 CLINICAL DATA:  Vaginal bleeding and pregnancy EXAM: OBSTETRIC <14 WK [place] AND TRANSVAGINAL OB US DOPPLER ULTRASOUND OF OVARIES TECHNIQUE: Both transabdominal and transvaginal ultrasound examinations were performed for complete evaluation of the  gestation as well as the maternal uterus, adnexal regions, and pelvic cul-de-sac. Transvaginal technique was performed to assess early pregnancy. Color and duplex Doppler ultrasound was utilized to evaluate blood flow to the ovaries. COMPARISON:  06/18/2019 FINDINGS: The gestational sac with internal yolk sac and fetal pole has migrated into the cervix, with probable bulging into the upper vagina. No fetal cardiac activity. Subchronic hemorrhage is seen about  the migrating sac. Negative appearance of the ovaries with a corpus luteum on the right. Pulsed Doppler evaluation of both ovaries demonstrates normal appearing low-resistance arterial and venous waveforms. IMPRESSION: Abortion in progress with migration of the gestational sac into the cervical canal. Electronically Signed   By: Monte Fantasia M.D.   On: 07/14/2019 06:28   US OB Transvaginal  Result Date: 07/14/2019 CLINICAL DATA:  Vaginal bleeding and pregnancy EXAM: OBSTETRIC <14 WK [place] AND TRANSVAGINAL OB US DOPPLER ULTRASOUND OF OVARIES TECHNIQUE: Both transabdominal and transvaginal ultrasound examinations were performed for complete evaluation of the gestation as well as the maternal uterus, adnexal regions, and pelvic cul-de-sac. Transvaginal technique was performed to assess early pregnancy. Color and duplex Doppler ultrasound was utilized to evaluate blood flow to the ovaries. COMPARISON:  06/18/2019 FINDINGS: The gestational sac with internal yolk sac and fetal pole has migrated into the cervix, with probable bulging into the upper vagina. No fetal cardiac activity. Subchronic hemorrhage is seen about the migrating sac. Negative appearance of the ovaries with a corpus luteum on the right. Pulsed Doppler evaluation of both ovaries demonstrates normal appearing low-resistance arterial and venous waveforms. IMPRESSION: Abortion in progress with migration of the gestational sac into the cervical canal. Electronically Signed   By: Monte Fantasia M.D.    On: 07/14/2019 06:28   US PELVIC DOPPLER (TORSION R/O OR MASS ARTERIAL FLOW)  Result Date: 07/14/2019 CLINICAL DATA:  Vaginal bleeding and pregnancy EXAM: OBSTETRIC <14 WK [place] AND TRANSVAGINAL OB US DOPPLER ULTRASOUND OF OVARIES TECHNIQUE: Both transabdominal and transvaginal ultrasound examinations were performed for complete evaluation of the gestation as well as the maternal uterus, adnexal regions, and pelvic cul-de-sac. Transvaginal technique was performed to assess early pregnancy. Color and duplex Doppler ultrasound was utilized to evaluate blood flow to the ovaries. COMPARISON:  06/18/2019 FINDINGS: The gestational sac with internal yolk sac and fetal pole has migrated into the cervix, with probable bulging into the upper vagina. No fetal cardiac activity. Subchronic hemorrhage is seen about the migrating sac. Negative appearance of the ovaries with a corpus luteum on the right. Pulsed Doppler evaluation of both ovaries demonstrates normal appearing low-resistance arterial and venous waveforms. IMPRESSION: Abortion in progress with migration of the gestational sac into the cervical canal. Electronically Signed   By: Monte Fantasia M.D.   On: 07/14/2019 06:28    Procedures Procedures   Medications Ordered in ED Medications - No data to display  ED Course  I have reviewed the triage vital signs and the nursing notes.  Pertinent labs & imaging results that were available during my care of the patient were reviewed by me and considered in my medical decision making (see chart for details).    MDM Rules/Calculators/A&P                      5:26 AM patient is a 29 year old African-American female with history of subchorionic hemorrhage of placenta in first trimester who presents with 1 hour of suprapubic pain.  Upon arrival she became nauseated and started vomiting and then noted vaginal bleeding.  Bedside ultrasound performed with my attending physician Dr. Ezequiel Essex who was unable to  visualize the patient's uterus likely secondary to body habitus.  I have ordered a transvaginal ultrasound and have also ordered basic labs.  I performed a pelvic exam with notable bleeding appreciated in the vaginal vault.  I did not visualize any clots or products of conception.  I was unable to visualize the  cervical os.  Per records, patient is Rh+.  We will closely monitor.  6:12 AM U/S performed showing abortion in progress with migration of the gestational sac into the cervical canal. Discussed with Dr. Macon Large who recommends that she come to MAU. I've discussed this with her and she has elected to go by personal vehicle. She will discuss with her boyfriend who will take her. She understands the severity of her symptoms and that she needs to come to MAU immediately. I discussed the risks of not doing so and patients questions were answered. She is hemodynamically stable at the time of discharge.   Final Clinical Impression(s) / ED Diagnoses Final diagnoses:  Pelvic pain affecting pregnancy  Inevitable abortion    Rx / DC Orders ED Discharge Orders    None       Placido Sou, PA-C 07/14/19 0700    Glynn Octave, MD 07/14/19 [phone]

## 2019-07-14 NOTE — MAU Note (Signed)
.   Brianna Lee is a 29 y.o. at [redacted]w[redacted]d here in MAU reporting: Patient states that she was sent by private vehicle from [facility]. She states that she is having a miscarriage. Patient states that the pain started at 0300 this morning and the bleeding didn't start until she arrived to [facility]. She states the bleeding is heavier than a period but she has not passed any clots.   Pain score: 10 Vitals:   07/14/19 0750  BP: 133/78  Pulse: 85  Resp: 16  Temp: 97.7 F (36.5 C)  SpO2: 100%

## 2019-07-14 NOTE — MAU Provider Note (Signed)
History     CSN: [id number]  Arrival date and time: 07/14/19 7672   First Provider Initiated Contact with Patient 07/14/19 0802      Chief Complaint  Patient presents with  . Miscarriage   HPI Brianna Lee is a 29 y.o. G1P0 at [redacted]w[redacted]d who presents to MAU from [facility] via private vehicle. She is s/p diagnosis of active miscarriage. On arrival to MAU she endorses new onset pressure in her vagina as well as ongoing 10/10 lower abdominal pain. She has not taken pain medication or eaten today. She is accompanied by her partner.  OB History    Gravida  1   Para      Term      Preterm      AB      Living        SAB      TAB      Ectopic      Multiple      Live Births              Past Medical History:  Diagnosis Date  . Scoliosis     Past Surgical History:  Procedure Laterality Date  . GALLBLADDER SURGERY      Family History  Problem Relation Age of Onset  . Hypertension Father   . Diabetes Father     Social History   Tobacco Use  . Smoking status: Former Games developer  . Smokeless tobacco: Never Used  . Tobacco comment: quit while pregnant  Substance Use Topics  . Alcohol use: Not Currently  . Drug use: Never    Allergies:  Allergies  Allergen Reactions  . Naproxen Swelling    Medications Prior to Admission  Medication Sig Dispense Refill Last Dose  . Prenatal Vit-Fe Fumarate-FA (MULTIVITAMIN-PRENATAL) 27-0.8 MG TABS tablet Take 1 tablet by mouth daily at 12 noon.   07/13/2019 at Unknown time  . promethazine (PHENERGAN) 25 MG tablet Take 1 tablet (25 mg total) by mouth every 6 (six) hours as needed for nausea or vomiting. 30 tablet 0 Past Week at Unknown time  . EPINEPHrine 0.3 mg/0.3 mL IJ SOAJ injection Inject 0.3 mg into the muscle See admin instructions. As directed for anaphylaxis       Review of Systems  Constitutional: Negative for fever.  Respiratory: Negative for shortness of breath.   Gastrointestinal: Positive for abdominal pain.   Genitourinary: Positive for vaginal bleeding.  Musculoskeletal: Negative for back pain.  Neurological: Negative for headaches.  All other systems reviewed and are negative.  Physical Exam   Blood pressure 133/78, pulse 85, temperature 97.7 F (36.5 C), temperature source Oral, resp. rate 16, last menstrual period 05/07/2019, SpO2 100 %.  Physical Exam  Nursing note and vitals reviewed. Constitutional: She is oriented to person, place, and time. She appears well-developed and well-nourished.  Cardiovascular: Normal rate and normal heart sounds.  Respiratory: Effort normal and breath sounds normal.  GI: Soft.  Genitourinary:    Genitourinary Comments: Gestational sac visibly bulging into vagina with opening of speculum. Gently removed with ring forceps. Intact. Small amount of bleeding.   Neurological: She is alert and oriented to person, place, and time.  Skin: Skin is warm and dry.  Psychiatric: She has a normal mood and affect. Her behavior is normal. Judgment and thought content normal.    MAU Course  Procedures: sterile speculum exam  --Report received from C. Neill, MAU night shift CNM prior to patient's arrival from [facility] --CNM at bedside 15, 30,  and 60 minutes following passing of POCs. Bleeding well controlled. --Returned to bedside at 0850. Bleeding well controlled. Patient's behavior more controlled than on arrival to MAU but she continues to endorses abdominal pain 10/10 "like a period" --Pain score improving one hour after PO medications in MAU --Patient sleeping 90 minutes after PO medications in MAU --Patient requests discharge at 90 minutes s/p arrival in MAU --Treatments and plan of care coordinated with Dr. Ester Rink  Orders Placed This Encounter  Procedures  . US OB Transvaginal  . CBC  . Discharge patient   Meds ordered this encounter  Medications  . oxyCODONE (Oxy IR/ROXICODONE) immediate release tablet 10 mg  . ondansetron (ZOFRAN-ODT) disintegrating  tablet 4 mg  . misoprostol (CYTOTEC) tablet 800 mcg  . promethazine (PHENERGAN) 25 MG tablet    Sig: Take 0.5 tablets (12.5 mg total) by mouth every 6 (six) hours as needed for nausea or vomiting.    Dispense:  30 tablet    Refill:  0    Order Specific Question:   Supervising Provider    Answer:   Verita Schneiders A [[id number]]  . oxyCODONE (ROXICODONE) 5 MG immediate release tablet    Sig: Take 2 tablets (10 mg total) by mouth every 4 (four) hours as needed for up to 3 doses for severe pain.    Dispense:  6 tablet    Refill:  0    Order Specific Question:   Supervising Provider    Answer:   Verita Schneiders A [[id number]]   Patient Vitals for the past 24 hrs:  BP Temp Temp src Pulse Resp SpO2  07/14/19 1032 118/68 -- -- 74 18 100 %  07/14/19 0750 133/78 97.7 F (36.5 C) Oral 85 16 100 %   Results for orders placed or performed during the hospital encounter of 07/14/19 (from the past 24 hour(s))  CBC     Status: None   Collection Time: 07/14/19  9:22 AM  Result Value Ref Range   WBC 7.9 4.0 - 10.5 K/uL   RBC 4.73 3.87 - 5.11 MIL/uL   Hemoglobin 13.0 12.0 - 15.0 g/dL   HCT 39.2 36.0 - 46.0 %   MCV 82.9 80.0 - 100.0 fL   MCH 27.5 26.0 - 34.0 pg   MCHC 33.2 30.0 - 36.0 g/dL   RDW 13.3 11.5 - 15.5 %   Platelets 294 150 - 400 K/uL   nRBC 0.0 0.0 - 0.2 %   US OB Comp < 14 Wks  Result Date: 07/14/2019 CLINICAL DATA:  Vaginal bleeding and pregnancy EXAM: OBSTETRIC <14 WK [place] AND TRANSVAGINAL OB US DOPPLER ULTRASOUND OF OVARIES TECHNIQUE: Both transabdominal and transvaginal ultrasound examinations were performed for complete evaluation of the gestation as well as the maternal uterus, adnexal regions, and pelvic cul-de-sac. Transvaginal technique was performed to assess early pregnancy. Color and duplex Doppler ultrasound was utilized to evaluate blood flow to the ovaries. COMPARISON:  06/18/2019 FINDINGS: The gestational sac with internal yolk sac and fetal pole has migrated into the cervix, with  probable bulging into the upper vagina. No fetal cardiac activity. Subchronic hemorrhage is seen about the migrating sac. Negative appearance of the ovaries with a corpus luteum on the right. Pulsed Doppler evaluation of both ovaries demonstrates normal appearing low-resistance arterial and venous waveforms. IMPRESSION: Abortion in progress with migration of the gestational sac into the cervical canal. Electronically Signed   By: Monte Fantasia M.D.   On: 07/14/2019 06:28   US OB Transvaginal  Result Date:  07/14/2019 CLINICAL DATA:  Abortion in progress EXAM: TRANSVAGINAL OB ULTRASOUND TECHNIQUE: Transvaginal ultrasound was performed for complete evaluation of the gestation as well as the maternal uterus, adnexal regions, and pelvic cul-de-sac. COMPARISON:  Pelvic ultrasound from earlier today FINDINGS: The gestational sac is no longer seen. There is heterogeneous thickening at the level of the lower uterine segment and endocervical canal which measures up to 12 mm in thickness. No color Doppler was applied. Negative adnexa. No complicated pelvic fluid IMPRESSION: Gestational sac on ultrasound earlier today is no longer seen. Heterogeneous thickening at the lower uterine segment and endocervical canal could reflect blood products or products of conception. Electronically Signed   By: Marnee Spring M.D.   On: 07/14/2019 10:01   Assessment and Plan  --29 y.o. G1P0 at [redacted]w[redacted]d  --S/p Complete miscarriage in MAU --VSS, reassuring repeat CBC in MAU --Blood type O POS --Discharge home in stable condition with bleeding precautions  F/U: --[facility] in one week --Message sent to clinic  Calvert Cantor, CNM 07/14/2019, 12:36 PM

## 2019-07-14 NOTE — Telephone Encounter (Signed)
Left message for patient to give our office a call to schedule 1 week lab visit and 2 week with provider due to recent visit to MAU.

## 2019-07-15 LAB — URINE CULTURE

## 2019-07-15 LAB — SURGICAL PATHOLOGY

## 2019-07-22 ENCOUNTER — Other Ambulatory Visit: Payer: Self-pay

## 2019-07-22 ENCOUNTER — Other Ambulatory Visit (INDEPENDENT_AMBULATORY_CARE_PROVIDER_SITE_OTHER): Payer: Medicaid Other | Admitting: *Deleted

## 2019-07-22 VITALS — BP 130/83 | HR 103 | Temp 97.8°F | Wt 224.0 lb

## 2019-07-22 DIAGNOSIS — O200 Threatened abortion: Secondary | ICD-10-CM

## 2019-07-22 NOTE — Progress Notes (Signed)
   [name] presents to [facility] for follow-up quant hCG blood draw today. She was seen in MAU for abdominal pain and n/v on 07/14/2019. Patient denies abdominal pain and still having vaginal bleeding today. Discussed with patient, we are following hCG levels today. Valid contact number for patient confirmed. I will call the patient with results.     Clovis Pu 07/22/2019 9:01 AM

## 2019-07-23 LAB — BETA HCG QUANT (REF LAB): hCG Quant: 30 m[IU]/mL

## 2019-07-30 ENCOUNTER — Institutional Professional Consult (permissible substitution): Payer: Medicaid Other | Admitting: Licensed Clinical Social Worker

## 2019-07-30 ENCOUNTER — Encounter: Payer: Self-pay | Admitting: Student

## 2019-07-30 ENCOUNTER — Ambulatory Visit (INDEPENDENT_AMBULATORY_CARE_PROVIDER_SITE_OTHER): Payer: Medicaid Other | Admitting: Student

## 2019-07-30 ENCOUNTER — Other Ambulatory Visit: Payer: Self-pay

## 2019-07-30 VITALS — BP 124/82 | HR 96 | Temp 97.8°F | Ht 64.0 in | Wt 235.4 lb

## 2019-07-30 DIAGNOSIS — O039 Complete or unspecified spontaneous abortion without complication: Secondary | ICD-10-CM | POA: Diagnosis not present

## 2019-07-30 NOTE — Progress Notes (Signed)
History:  Brianna Lee is a 29 y.o. G1P0 who presents to clinic today for miscarriage follow up. Had a first trimester miscarriage on 3/29. This was the first pregnancy for her & her significant other. Since the loss, she has not had any bleeding. The week after her miscarriage, her grandfather died. She reports being sad about both losses. States she has good support at home & denies any thoughts of hurting herself.  Her & her partner are interested in trying to conceive again. To her knowledge there is no family history of recurrent miscarriages.  She does not know when her last pap smear was & she does not have a primary care provider.    There are no problems to display for this patient.   Allergies  Allergen Reactions  . Naproxen Swelling    Current Outpatient Medications on File Prior to Visit  Medication Sig Dispense Refill  . EPINEPHrine 0.3 mg/0.3 mL IJ SOAJ injection Inject 0.3 mg into the muscle See admin instructions. As directed for anaphylaxis     No current facility-administered medications on file prior to visit.     The following portions of the patient's history were reviewed and updated as appropriate: allergies, current medications, family history, past medical history, social history, past surgical history and problem list.  Review of Systems:  Other than those mentioned in HPI all ROS negative   Objective:  Physical Exam BP 124/82 (BP Location: Right Arm, Patient Position: Sitting, Cuff Size: Large)   Pulse 96   Temp 97.8 F (36.6 C) (Oral)   Ht 5\' 4"  (1.626 m)   Wt 235 lb 6.4 oz (106.8 kg)   LMP 05/07/2019   BMI 40.41 kg/m  CONSTITUTIONAL: Well-developed, well-nourished female in no acute distress.  EYES: EOM intact, conjunctivae normal, no scleral icterus HEAD: Normocephalic, atraumatic  RESPIRATORY: Effort normal, no problems with respiration noted.  PSYCHIATRIC: Normal mood and affect. Normal behavior. Normal judgment and thought content. .    Labs and Imaging HCG was 30 last week. Will recheck today  Assessment & Plan:  1. Complete miscarriage -Patient physically doing well. Appropriately grieving her losses. Offered integrated behavioral health services; she will speak with 05/09/2019, CSW while here today.  -Recheck HCG to negative -Encouraged to establish with PCP and return here for well woman exam/pap smear - Beta hCG quant (ref lab)   Gorden Harms, NP 07/30/2019 2:17 PM

## 2019-07-30 NOTE — BH Specialist Note (Signed)
Integrated Behavioral Health Initial Visit  MRN: [id number] Name: Brianna Lee  Number of Integrated Behavioral Health Clinician visits:: 1 Session Start time: 2:35pm  Session End time: 2:48pm Total time: 13 mins   Type of Service: Integrated Behavioral Health- Individual Interpretor:no  Interpretor Name and Language: none   Warm Hand Off Completed.       SUBJECTIVE: Jacqualine Weichel is a 29 y.o. female accompanied by n/a Patient was referred by Estanislado Spire NP for SAB Patient reports the following symptoms/concerns: crying, feelings of guilt Duration of problem: July 14 2019 ; Severity of problem: mild  OBJECTIVE: Mood: Sad  and Affect: congruent Risk of harm to self or others: No risk of harm to self or others.   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 Reduce symptoms of: feelings of guilt 2. Increase knowledge and/or ability of:  3. Demonstrate ability to: self manage symptoms   INTERVENTIONS: Interventions utilized: supportive counseling   Standardized Assessments completed: phq9  ASSESSMENT: Patient currently experiencing adjustment disorder with depressed mood. Ms. Torrance reports boyfriend is very supportive since patient's miscarriage and grandfather's death. Patient reports relying on family to cope with feelings of guilt.    Patient may benefit from integrated behavioral health   PLAN: 1. Follow up with behavioral health clinician on : 3 weeks follow up virtual  2. Behavioral recommendations: increase social interaction, increase physical activity 3. Referral(s): none  4. "From scale of 1-10, how likely are you to follow plan?":   [name], LCSW

## 2019-07-30 NOTE — Patient Instructions (Addendum)
Natural Family Planning  Natural Family Planning (NFP) is a type of birth control (contraception) in which no form of contraceptive medicine or device is used. The NFP method relies on knowing which days of the month a woman's ovary is producing an egg (ovulation). Ovulation is the time in the menstrual cycle when a woman is most fertile and, therefore, most likely to become pregnant. To lower the chance of pregnancy, sex is avoided during ovulation. NFP is a safe method of birth control and can prevent pregnancy if it is done correctly. However, NFP does not provide protection from sexually transmitted diseases. NFP can also be used as a method of getting pregnant, by deciding to have sex during ovulation. How does the NFP method work? NFP works by making both sexual partners aware of how the woman's body functions during her menstrual cycle.  Usually, a woman has a menstrual period every 28-30 days. However, there can be 23-35 days between each menstrual period. This varies for each woman. A woman with a 28-day menstrual cycle has about 6 days a month when she is most likely to get pregnant.  Ovulation happens 12-14 days before the start of the next menstrual period. There are different methods that are used to determine when ovulation starts.  An egg is fertile for 24 hours after it is released from the ovary. Sperm can live for 3 days or more. What NFP methods can be used to prevent pregnancy? The basal body temperature method During ovulation, there is often a slight increase in body temperature. To use this method:  Take your temperature every morning before getting out of bed. Write the temperature on a chart.  Do not have sex from the day the menstrual periods starts until 3 days after the increase in temperature. Note that body temperature may increase as a result of various factors, including fever, restless sleep, and working schedules. The cervical mucus method Right before  ovulation, mucus from the lower part of the uterus (cervix) changes from dry and sticky to wet and slippery. To use this method:  Check the mucus every day to look for these changes. Ovulation happens on the last day of wet, slippery mucus.  Do not have sex starting when you first see wet, slippery mucus and until 4 days after it stops or returns to its normal consistency.  With this method, it is safe to have sex: ? After the 4 days have passed, and until 10 days after the menstrual period starts. ? On days when mucus is dry. Note that cervical mucus can increase or change consistency due to reasons other than ovulation, such as infection, lubricants, some medicines, and sexual arousal. Other variations of the cervical mucus method include the TwoDay method, [place] ovulation method, and the [employer]. The symptothermal method This method combines the basal body temperature and the cervical mucus methods. The calendar method This method involves tracking menstrual cycles to determine when ovulation occurs. This method is helpful when the menstrual cycle varies in length. To use this method:  For 6 months, record when menstrual periods start and end and the length of each menstrual cycle. The length of a menstrual cycle is from day 1 of the present menstrual period to day 1 of the next menstrual period.  Use this information to determine when you will likely ovulate. Avoid sex during that time. You may need help from your health care provider to determine which days you are most likely to get pregnant. ?  Ovulation usually happens 12-14 days before the start of the next menstrual period. ? Light vaginal bleeding (spotting) or abdominal cramps during the middle of a menstrual cycle may be signs of ovulation. However, not all women have these symptoms. The standard days method This method is based on studies of women's hormone levels throughout normal menstrual cycles. Based on these  studies, a standard rule was developed to predict when women are most fertile during their menstrual cycle. According to the rule, if your cycle is 26-32 days long, you are most fertile between days 8 and 19. To prevent pregnancy, you should avoid having sex during this time, or use a barrier method of birth control. This method works best if your cycle is regularly between 26-32 days long. When should I not use the NFP method? You should not use NFP if:  You have very irregular menstrual periods or you sometimes skip a menstrual period.  You have abnormal vaginal bleeding.  You recently had a baby or are breastfeeding.  You have a vaginal or cervical infection.  You take medicines that can affect vaginal mucus or body temperature. These medicines include antibiotics, thyroid medicines, and antihistamines that are found in some cold and allergy medicines.  You absolutely do not want to become pregnant at the current time. Other methods of contraception are more effective at preventing pregnancy than NFP.  You are concerned about sexually transmitted diseases. Summary  Natural Family Planning methods help women and their partners to understand how to avoid pregnancy without using medicines or other methods.  A woman learns to recognize her most fertile days. A woman with a 28 day menstrual cycle has about 6 days per month when she can get pregnant. This information is not intended to replace advice given to you by your health care provider. Make sure you discuss any questions you have with your health care provider. Document Revised: 07/26/2018 Document Reviewed: 05/22/2016 Elsevier Patient Education  2020 Elsevier Inc.       Managing Pregnancy Loss Pregnancy loss can happen any time during a pregnancy. Often the cause is not known. It is rarely because of anything you did. Pregnancy loss in early pregnancy (during the first trimester) is called a miscarriage. This type of pregnancy  loss is the most common. Pregnancy loss that happens after 20 weeks of pregnancy is called fetal demise if the baby's heart stops beating before birth. Fetal demise is much less common. Some women experience spontaneous labor shortly after fetal demise resulting in a stillborn birth (stillbirth). Any pregnancy loss can be devastating. You will need to recover both physically and emotionally. Most women are able to get pregnant again after a pregnancy loss and deliver a healthy baby. How to manage emotional recovery  Pregnancy loss is very hard emotionally. You may feel many different emotions while you grieve. You may feel sad and angry. You may also feel guilty. It is normal to have periods of crying. Emotional recovery can take longer than physical recovery. It is different for everyone. Taking these steps can help you in managing this loss:  Remember that it is unlikely you did anything to cause the pregnancy loss.  Share your thoughts and feelings with friends, family, and your partner. Remember that your partner is also recovering emotionally.  Make sure you have a good support system. Do not spend too much time alone.  Meet with a pregnancy loss counselor or join a pregnancy loss support group.  Get enough sleep and eat  a healthy diet. Return to regular exercise when you have recovered physically.  Do not use drugs or alcohol to manage your emotions.  Consider seeing a mental health professional to help you recover emotionally.  Ask a friend or loved one to help you decide what to do with any clothing and nursery items you received for your baby. In the case of a stillbirth, many women benefit from taking additional steps in the grieving process. You may want to:  Hold your baby after the birth.  Name your baby.  Request a birth certificate.  Create a keepsake such as handprints or footprints.  Dress your baby and have a picture taken.  Make funeral arrangements.  Ask for a  baptism or blessing. Hospitals have staff members who can help you with all these arrangements. How to recognize emotional stress It is normal to have emotional stress after a pregnancy loss. But emotional stress that lasts a long time or becomes severe requires treatment. Watch out for these signs of severe emotional stress:  Sadness, anger, or guilt that is not going away and is interfering with your normal activities.  Relationship problems that have occurred or gotten worse since the pregnancy loss.  Signs of depression that last longer than 2 weeks. These may include: ? Sadness. ? Anxiety. ? Hopelessness. ? Loss of interest in activities you enjoy. ? Inability to concentrate. ? Trouble sleeping or sleeping too much. ? Loss of appetite or overeating. ? Thoughts of death or of hurting yourself. Follow these instructions at home:  Take over-the-counter and prescription medicines only as told by your health care provider.  Rest at home until your energy level returns. Return to your normal activities as told by your health care provider. Ask your health care provider what activities are safe for you.  When you are ready, meet with your health care provider to discuss steps to take for a future pregnancy.  Keep all follow-up visits as told by your health care provider. This is important. Where to find support  To help you and your partner with the process of grieving, talk with your health care provider or seek counseling.  Consider meeting with others who have experienced pregnancy loss. Ask your health care provider about support groups and resources. Where to find more information  [place] Department of Health and Cytogeneticist on Women's Health: http://hoffman.com/  American Pregnancy Association: www.americanpregnancy.org Contact a health care provider if:  You continue to experience grief, sadness, or lack of motivation for everyday activities, and those feelings do  not improve over time.  You are struggling to recover emotionally, especially if you are using alcohol or substances to help. Get help right away if:  You have thoughts of hurting yourself or others. If you ever feel like you may hurt yourself or others, or have thoughts about taking your own life, get help right away. You can go to your nearest emergency department or call:  Your local emergency services (911 in the [place]  A suicide crisis helpline, such as the National Suicide Prevention Lifeline at 613-784-5019. This is open 24 hours a day. Summary  Any pregnancy loss can be difficult physically and emotionally.  You may experience many different emotions while you grieve. Emotional recovery can last longer than physical recovery.  It is normal to have emotional stress after a pregnancy loss. But emotional stress that lasts a long time or becomes severe requires treatment.  See your health care provider if you are struggling  emotionally after a pregnancy loss. This information is not intended to replace advice given to you by your health care provider. Make sure you discuss any questions you have with your health care provider. Document Revised: 07/24/2018 Document Reviewed: 06/14/2017 Elsevier Patient Education  2020 [employer].

## 2019-07-31 LAB — BETA HCG QUANT (REF LAB): hCG Quant: 6 m[IU]/mL

## 2019-08-21 ENCOUNTER — Ambulatory Visit (INDEPENDENT_AMBULATORY_CARE_PROVIDER_SITE_OTHER): Payer: Medicaid Other | Admitting: Licensed Clinical Social Worker

## 2019-08-21 DIAGNOSIS — F4321 Adjustment disorder with depressed mood: Secondary | ICD-10-CM | POA: Diagnosis not present

## 2019-08-21 NOTE — BH Specialist Note (Signed)
Integrated Behavioral Health via Telemedicine Video Visit  08/21/2019 Brianna Lee [id number]  Number of Integrated Behavioral Health visits: 2 Session Start time: 10:00am  Session End time: 10:18am Total time: 18 mins via mychart video  Referring Provider: Estanislado Spire NP Type of Visit: Video Patient/Family location: Home   [facility] Provider location: [facility] All persons participating in visit: Brianna Lee and LCSWA A. Laurielle Selmon  Confirmed patient's address: Yes  Confirmed patient's phone number: yes Any changes to demographics: no  Confirmed patient's insurance: no Any changes to patient's insurance: No   Discussed confidentiality: yes  I connected with [name] and/or Leilanny Macdowell's n/a by a video enabled telemedicine application and verified that I am speaking with the correct person using two identifiers.     I discussed the limitations of evaluation and management by telemedicine and the availability of in person appointments.  I discussed that the purpose of this visit is to provide behavioral health care while limiting exposure to the novel coronavirus.   Discussed there is a possibility of technology failure and discussed alternative modes of communication if that failure occurs.  I discussed that engaging in this video visit, they consent to the provision of behavioral healthcare and the services will be billed under their insurance.  Patient and/or legal guardian expressed understanding and consented to video visit: yes  PRESENTING CONCERNS: Patient and/or family reports the following symptoms/concerns: feelings of guilt and depressed mood  Duration of problem: One month ; Severity of problem: mild  STRENGTHS (Protective Factors/Coping Skills): Supportive family, utilizing community resources   GOALS ADDRESSED: Patient will: 1.  Reduce symptoms of:  Depress mood  2.  Increase knowledge of diagnosis and coping skills to alleviate symptoms    3.  Demonstrate ability  to: self manage symptoms   INTERVENTIONS: Interventions utilized:  Supportive counseling Standardized Assessments completed: 07/30/2019  ASSESSMENT: Patient currently experiencing adjustment disorder with depressed mood    Patient may benefit from integrated behavioral health.  PLAN: 1. Follow up with behavioral health clinician on : as needed  2. Behavioral recommendations: information given for support groups grief support for pregnancy loss with at [email or web address], continue demonstrating mindfulness techniques, when appropriate increase social interaction.  3. Referral(s): [email or web address]  I discussed the assessment and treatment plan with the patient and/or parent/guardian. They were provided an opportunity to ask questions and all were answered. They agreed with the plan and demonstrated an understanding of the instructions.   They were advised to call back or seek an in-person evaluation if the symptoms worsen or if the condition fails to improve as anticipated.  [name]

## 2019-09-26 ENCOUNTER — Ambulatory Visit (INDEPENDENT_AMBULATORY_CARE_PROVIDER_SITE_OTHER): Payer: Medicaid Other

## 2019-09-26 ENCOUNTER — Other Ambulatory Visit: Payer: Self-pay

## 2019-09-26 ENCOUNTER — Other Ambulatory Visit (HOSPITAL_COMMUNITY)
Admission: RE | Admit: 2019-09-26 | Discharge: 2019-09-26 | Disposition: A | Discharge status: Home / Self Care | Payer: Medicaid Other | Source: Ambulatory Visit

## 2019-09-26 VITALS — BP 123/85 | HR 86 | Wt 234.2 lb

## 2019-09-26 DIAGNOSIS — Z113 Encounter for screening for infections with a predominantly sexual mode of transmission: Secondary | ICD-10-CM | POA: Insufficient documentation

## 2019-09-26 DIAGNOSIS — A599 Trichomoniasis, unspecified: Secondary | ICD-10-CM

## 2019-09-26 DIAGNOSIS — R350 Frequency of micturition: Secondary | ICD-10-CM | POA: Diagnosis not present

## 2019-09-26 DIAGNOSIS — Z01419 Encounter for gynecological examination (general) (routine) without abnormal findings: Secondary | ICD-10-CM | POA: Insufficient documentation

## 2019-09-26 DIAGNOSIS — Z124 Encounter for screening for malignant neoplasm of cervix: Secondary | ICD-10-CM | POA: Diagnosis not present

## 2019-09-26 DIAGNOSIS — F17210 Nicotine dependence, cigarettes, uncomplicated: Secondary | ICD-10-CM | POA: Diagnosis not present

## 2019-09-26 DIAGNOSIS — F32A Depression, unspecified: Secondary | ICD-10-CM

## 2019-09-26 DIAGNOSIS — F320 Major depressive disorder, single episode, mild: Secondary | ICD-10-CM

## 2019-09-26 DIAGNOSIS — Z789 Other specified health status: Secondary | ICD-10-CM

## 2019-09-26 MED ORDER — PREPLUS 27-1 MG PO TABS: 1.0000 | ORAL_TABLET | Freq: Every day | ORAL | 3 refills | Status: DC

## 2019-09-26 MED ORDER — PREPLUS 27-1 MG PO TABS: 1.0000 | ORAL_TABLET | Freq: Once | ORAL | 3 refills | Status: DC

## 2019-09-26 NOTE — Progress Notes (Signed)
Pt presents for annual, pap, and all STD testing.  Trying to conceive; recent SAB in March  GAD = 16

## 2019-09-26 NOTE — Progress Notes (Addendum)
GYNECOLOGY OFFICE VISIT NOTE-WELL WOMAN EXAM  History:   Brianna Lee [id number] here today for annual visit.  She states she is trying to conceive and has been taking PNV sporadically.   She reports she is grieving with the loss of her grandfather, on Easter, and recent SAB in March . She states that she has been coping well, but struggles at times.  She states she has spoken with family members regarding her feelings, but feels that sometimes it doesn't help her burden.  Patient states that she is happy, but "not like I used to be."    Reproductive Concerns: LMP: June 8th, but it was only two days.   Partners in last year: One She denies any abnormal vaginal discharge, bleeding, pelvic pain or pain or discomfort during sex.  However, she does report that she has noticed an increase in urination.  However, she denies pain or discomfort.   STD Testing: Desires full testing.  Breast Exams: She does not perform breast exams, but states she would notice changes if they were to occur. Patient denies breast concerns and endorses a family history of breast cancer stating her paternal grandmother died from this diagnosis.  She denies family or personal history of uterine, cervical, or ovarian cancer  Medical and Nutrition: PCP: None Exercise: Kickboxing 5x week for 30-45 minutes.  Tobacco/Drugs/Alcohol: Cigarette usage ~2 daily.  Reports "I only start smoking when I feel depressed." Patient expresses desire to quit, but declines information.  Nutrition: "somewhat" when asked about balanced nutrition  Social: Safety at home: Endorses DV/A: Denies Social Support: Endorses Employment: [employer]   Past Medical History:  Diagnosis Date  . Scoliosis     Past Surgical History:  Procedure Laterality Date  . GALLBLADDER SURGERY      The following portions of the patient's history were reviewed and updated as appropriate: allergies, current medications, past family history, past medical  history, past social history, past surgical history and problem list.   Health Maintenance:  No pap history of file.   Review of Systems:  Pertinent items noted in HPI and remainder of comprehensive ROS otherwise negative.    Objective:    Physical Exam BP 123/85   Pulse 86   Wt 234 lb 3.2 oz (106.2 kg)   LMP 09/23/2019   Breastfeeding Unknown   BMI 40.20 kg/m  Physical Exam Constitutional:      Appearance: Normal appearance. She is obese.  HENT:     Head: Normocephalic and atraumatic.  Eyes:     Conjunctiva/sclera: Conjunctivae normal.  Neck:     Thyroid: No thyroid mass, thyromegaly or thyroid tenderness.  Cardiovascular:     Rate and Rhythm: Normal rate and regular rhythm.     Heart sounds: Normal heart sounds.  Pulmonary:     Effort: No respiratory distress.     Breath sounds: Normal breath sounds.  Chest:     Breasts:        Right: No mass, nipple discharge, skin change or tenderness.        Left: No mass, nipple discharge, skin change or tenderness.     Comments: CBE Performed Abdominal:     General: Bowel sounds are normal.     Palpations: Abdomen is soft.     Tenderness: There is no abdominal tenderness.  Genitourinary:    General: Normal vulva.     Labia:        Right: No tenderness.        Left: No  tenderness.      Vagina: Vaginal discharge present. No bleeding.     Cervix: No friability, erythema or cervical bleeding.     Comments: Speculum Exam:  -NEFG -Vault: Scant amt thin white discharge. CV collected -Cervix: Pink, No lesions, cysts, or polyps.  Pap collected. No discharge noted.  -BME: No tenderness, uterus size difficult to assess d/t panus.  Musculoskeletal:        General: Normal range of motion.     Cervical back: Normal range of motion.  Skin:    General: Skin is warm and dry.  Neurological:     Mental Status: She is alert and oriented to person, place, and time.  Psychiatric:        Mood and Affect: Mood normal.        Thought  Content: Thought content normal.      Labs and Imaging No results found for this or any previous visit (from the past 168 hour(s)). No results found.   Assessment & Plan:       1. Well woman exam with routine gynecological exam -Exam findings discussed. -Educated on AHA exercise recommendations of 30 minutes of moderate to vigorous activity at least 5x/week. -Educated and encouraged to initiate monthly SBE with increased breast awareness including examination of breast for skin changes, moles, tenderness, etc.  - Cytology - PAP  2. Screening for STD (sexually transmitted disease) -Labs ordered and to be collected. -Informed of turnover time and provider/clinic policy on releasing results. - Cervicovaginal ancillary only( [place]) - Hepatitis B surface antigen - Hepatitis C antibody - HIV Antibody (routine testing w rflx) - RPR  3. Screening for cervical cancer -Educated on ASCCP guidelines regarding pap smear evaluation and frequency. - Cytology - PAP  4. Cigarette smoker -Patient declines information on assistance with quitting.  -Encouraged quitting prior to conception.   5. Attempting to conceive -Rx for PNV given. -Encouraged weight loss to promote healthy lifestyle and pregnancy.  6. Urinary frequency -Culture collected.   7. Mild depression (HCC) -GAD 16 -Referred to Ambulatory Behavioral  -Encouraged to continue to express grief with family members.   Routine preventative health maintenance measures emphasized. Please refer to After Visit Summary for other counseling recommendations.   Return in about 1 year (around 09/25/2020), or if symptoms worsen or fail to improve, for Annual Visit.      Cherre Robins, CNM 09/26/2019

## 2019-09-28 LAB — URINE CULTURE: Organism ID, Bacteria: NO GROWTH

## 2019-09-29 LAB — CYTOLOGY - PAP: Diagnosis: NEGATIVE

## 2019-09-29 LAB — CERVICOVAGINAL ANCILLARY ONLY
Bacterial Vaginitis (gardnerella): POSITIVE — AB
Candida Glabrata: NEGATIVE
Candida Vaginitis: NEGATIVE
Chlamydia: NEGATIVE
Comment: NEGATIVE
Comment: NEGATIVE
Comment: NEGATIVE
Comment: NEGATIVE
Comment: NEGATIVE
Comment: NORMAL
Neisseria Gonorrhea: NEGATIVE
Trichomonas: POSITIVE — AB

## 2019-09-30 LAB — HIV ANTIBODY (ROUTINE TESTING W REFLEX): HIV Screen 4th Generation wRfx: NONREACTIVE

## 2019-09-30 LAB — RPR, QUANT+TP ABS (REFLEX)
Rapid Plasma Reagin, Quant: 1:1 {titer} — ABNORMAL HIGH
T Pallidum Abs: NONREACTIVE

## 2019-09-30 LAB — RPR: RPR Ser Ql: REACTIVE — AB

## 2019-09-30 LAB — HEPATITIS B SURFACE ANTIGEN: Hepatitis B Surface Ag: NEGATIVE

## 2019-09-30 LAB — HEPATITIS C ANTIBODY: Hep C Virus Ab: <0.1 s/co ratio (ref 0.0–0.9)

## 2019-10-02 MED ORDER — METRONIDAZOLE 500 MG PO TABS: 2000.0000 mg | ORAL_TABLET | Freq: Once | ORAL | 0 refills | Status: AC

## 2019-10-02 NOTE — Addendum Note (Signed)
Addended by: Gerrit Heck L on: 10/02/2019 08:59 PM   Modules accepted: Orders

## 2019-10-14 ENCOUNTER — Telehealth: Payer: Self-pay | Admitting: General Practice

## 2019-10-14 NOTE — Telephone Encounter (Signed)
Called and left message on VM informing pt that appointment scheduled for tomorrow, 10/15/2019 at 2:30pm will be changed to a virtual visit via Mychart video d/t provider will not be available in office.  Asked patient to give our office a call back.

## 2019-10-15 ENCOUNTER — Ambulatory Visit (INDEPENDENT_AMBULATORY_CARE_PROVIDER_SITE_OTHER): Payer: Medicaid Other | Admitting: Licensed Clinical Social Worker

## 2019-10-15 ENCOUNTER — Telehealth: Payer: Self-pay | Admitting: Licensed Clinical Social Worker

## 2019-10-15 ENCOUNTER — Encounter: Payer: Medicaid Other | Admitting: Licensed Clinical Social Worker

## 2019-10-15 DIAGNOSIS — F4321 Adjustment disorder with depressed mood: Secondary | ICD-10-CM

## 2019-10-15 NOTE — Telephone Encounter (Signed)
Called pt twice regarding schedule appt. Left detailed message

## 2019-10-15 NOTE — BH Specialist Note (Signed)
Integrated Behavioral Health via Telemedicine Video (Caregility) Visit  10/15/2019 Brianna Lee [id number]  Number of Integrated Behavioral Health visits: 3 Session Start time: 3:15pm  Session End time: 3:32pm Total time: 17 mins via mychart   Referring Provider: Estanislado Spire CNM Type of Visit: Video Patient/Family location: Work  [facility] Provider location: Office  All persons participating in visit: Brianna Lee and LCSWA A. Nelani Schmelzle   Confirmed patient's address: yes  Confirmed patient's phone number: yes  Any changes to demographics: no  Confirmed patient's insurance: no Any changes to patient's insurance: no  Discussed confidentiality: yes  I connected with Brianna Lee and/or [name]'s n/a by a video enabled telemedicine application (Caregility) and verified that I am speaking with the correct person using two identifiers.     I discussed the limitations of evaluation and management by telemedicine and the availability of in person appointments.  I discussed that the purpose of this visit is to provide behavioral health care while limiting exposure to the novel coronavirus.   Discussed there is a possibility of technology failure and discussed alternative modes of communication if that failure occurs.  I discussed that engaging in this virtual visit, they consent to the provision of behavioral healthcare and the services will be billed under their insurance.  Patient and/or legal guardian expressed understanding and consented to virtual visit: yes  PRESENTING CONCERNS: Patient and/or family reports the following symptoms/concerns: SAB Duration of problem: approx two months ; Severity of problem: mild  STRENGTHS (Protective Factors/Coping Skills): According to 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   GOALS ADDRESSED: Patient will: 1.  Reduce symptoms of: depressed mood   2.  Increase knowledge of diagnosis and learn  triggers   3.  Demonstrate ability to: self manage symptoms   INTERVENTIONS: Interventions utilized:  Supportive counseling  Standardized Assessments completed:  09/26/2019   ASSESSMENT: Patient currently experiencing adjustment disorder with depressed mood. [name] reports improvement with mood since participating in integrated behavioral health. According to [name] kickboxing has also help in alleviating depressive symptoms.  Patient may benefit from community support groups for continuation of care   PLAN: 1. Follow up with behavioral health clinician on : as needed  2. Behavioral recommendations: engage in support groups, continue with mindfulness technques 3. Referral(s): none  I discussed the assessment and treatment plan with the patient and/or parent/guardian. They were provided an opportunity to ask questions and all were answered. They agreed with the plan and demonstrated an understanding of the instructions.   They were advised to call back or seek an in-person evaluation if the symptoms worsen or if the condition fails to improve as anticipated.  [name]

## 2019-11-17 DIAGNOSIS — Z3201 Encounter for pregnancy test, result positive: Secondary | ICD-10-CM | POA: Diagnosis not present

## 2019-11-17 DIAGNOSIS — N911 Secondary amenorrhea: Secondary | ICD-10-CM | POA: Diagnosis not present

## 2019-11-17 DIAGNOSIS — R8290 Unspecified abnormal findings in urine: Secondary | ICD-10-CM | POA: Diagnosis not present

## 2019-12-09 ENCOUNTER — Ambulatory Visit (INDEPENDENT_AMBULATORY_CARE_PROVIDER_SITE_OTHER): Payer: Medicaid Other | Admitting: *Deleted

## 2019-12-09 ENCOUNTER — Encounter: Payer: Self-pay | Admitting: General Practice

## 2019-12-09 ENCOUNTER — Other Ambulatory Visit: Payer: Self-pay

## 2019-12-09 ENCOUNTER — Other Ambulatory Visit (HOSPITAL_COMMUNITY)
Admission: RE | Admit: 2019-12-09 | Discharge: 2019-12-09 | Disposition: A | Discharge status: Home / Self Care | Payer: Medicaid Other | Source: Ambulatory Visit | Attending: Obstetrics and Gynecology | Admitting: Obstetrics and Gynecology

## 2019-12-09 VITALS — BP 109/72 | HR 75 | Temp 98.3°F | Wt 236.2 lb

## 2019-12-09 DIAGNOSIS — Z3480 Encounter for supervision of other normal pregnancy, unspecified trimester: Secondary | ICD-10-CM | POA: Diagnosis not present

## 2019-12-09 DIAGNOSIS — Z789 Other specified health status: Secondary | ICD-10-CM

## 2019-12-09 DIAGNOSIS — O0990 Supervision of high risk pregnancy, unspecified, unspecified trimester: Secondary | ICD-10-CM | POA: Insufficient documentation

## 2019-12-09 MED ORDER — BLOOD PRESSURE MONITOR AUTOMAT DEVI: 1.0000 | Freq: Every day | 0 refills | Status: AC

## 2019-12-09 MED ORDER — GOJJI WEIGHT SCALE MISC: 1.0000 | Freq: Every day | 0 refills | Status: DC | PRN

## 2019-12-09 NOTE — Patient Instructions (Addendum)
First Trimester of Pregnancy  The first trimester of pregnancy is from week 1 until the end of week 13 (months 1 through 3). During this time, your baby will begin to develop inside you. At 6-8 weeks, the eyes and face are formed, and the heartbeat can be seen on ultrasound. At the end of 12 weeks, all the baby's organs are formed. Prenatal care is all the medical care you receive before the birth of your baby. Make sure you get good prenatal care and follow all of your doctor's instructions. Follow these instructions at home: Medicines  Take over-the-counter and prescription medicines only as told by your doctor. Some medicines are safe and some medicines are not safe during pregnancy.  Take a prenatal vitamin that contains at least 600 micrograms (mcg) of folic acid.  If you have trouble pooping (constipation), take medicine that will make your stool soft (stool softener) if your doctor approves. Eating and drinking   Eat regular, healthy meals.  Your doctor will tell you the amount of weight gain that is right for you.  Avoid raw meat and uncooked cheese.  If you feel sick to your stomach (nauseous) or throw up (vomit): ? Eat 4 or 5 small meals a day instead of 3 large meals. ? Try eating a few soda crackers. ? Drink liquids between meals instead of during meals.  To prevent constipation: ? Eat foods that are high in fiber, like fresh fruits and vegetables, whole grains, and beans. ? Drink enough fluids to keep your pee (urine) clear or pale yellow. Activity  Exercise only as told by your doctor. Stop exercising if you have cramps or pain in your lower belly (abdomen) or low back.  Do not exercise if it is too hot, too humid, or if you are in a place of great height (high altitude).  Try to avoid standing for long periods of time. Move your legs often if you must stand in one place for a long time.  Avoid heavy lifting.  Wear low-heeled shoes. Sit and stand up  straight.  You can have sex unless your doctor tells you not to. Relieving pain and discomfort  Wear a good support bra if your breasts are sore.  Take warm water baths (sitz baths) to soothe pain or discomfort caused by hemorrhoids. Use hemorrhoid cream if your doctor says it is okay.  Rest with your legs raised if you have leg cramps or low back pain.  If you have puffy, bulging veins (varicose veins) in your legs: ? Wear support hose or compression stockings as told by your doctor. ? Raise (elevate) your feet for 15 minutes, 3-4 times a day. ? Limit salt in your food. Prenatal care  Schedule your prenatal visits by the twelfth week of pregnancy.  Write down your questions. Take them to your prenatal visits.  Keep all your prenatal visits as told by your doctor. This is important. Safety  Wear your seat belt at all times when driving.  Make a list of emergency phone numbers. The list should include numbers for family, friends, the hospital, and police and fire departments. General instructions  Ask your doctor for a referral to a local prenatal class. Begin classes no later than at the start of month 6 of your pregnancy.  Ask for help if you need counseling or if you need help with nutrition. Your doctor can give you advice or tell you where to go for help.  Do not use hot tubs, steam   tubs, steam rooms, or saunas.  Do not douche or use tampons or scented sanitary pads.  Do not cross your legs for long periods of time.  Avoid all herbs and alcohol. Avoid drugs that are not approved by your doctor.  Do not use any tobacco products, including cigarettes, chewing tobacco, and electronic cigarettes. If you need help quitting, ask your doctor. You may get counseling or other support to help you quit.  Avoid cat litter boxes and soil used by cats. These carry germs that can cause birth defects in the baby and can cause a loss of your baby (miscarriage) or stillbirth.  Visit your dentist.  At home, brush your teeth with a soft toothbrush. Be gentle when you floss. Contact a doctor if:  You are dizzy.  You have mild cramps or pressure in your lower belly.  You have a nagging pain in your belly area.  You continue to feel sick to your stomach, you throw up, or you have watery poop (diarrhea).  You have a bad smelling fluid coming from your vagina.  You have pain when you pee (urinate).  You have increased puffiness (swelling) in your face, hands, legs, or ankles. Get help right away if:  You have a fever.  You are leaking fluid from your vagina.  You have spotting or bleeding from your vagina.  You have very bad belly cramping or pain.  You gain or lose weight rapidly.  You throw up blood. It may look like coffee grounds.  You are around people who have [place] measles, fifth disease, or chickenpox.  You have a very bad headache.  You have shortness of breath.  You have any kind of trauma, such as from a fall or a car accident. Summary  The first trimester of pregnancy is from week 1 until the end of week 13 (months 1 through 3).  To take care of yourself and your unborn baby, you will need to eat healthy meals, take medicines only if your doctor tells you to do so, and do activities that are safe for you and your baby.  Keep all follow-up visits as told by your doctor. This is important as your doctor will have to ensure that your baby is healthy and growing well. This information is not intended to replace advice given to you by your health care provider. Make sure you discuss any questions you have with your health care provider. Document Revised: 07/25/2018 Document Reviewed: 04/11/2016 Elsevier Patient Education  2020 [employer].  Genetic Testing During Pregnancy Genetic testing during pregnancy is also called prenatal genetic testing. This type of testing can determine if your baby is at risk of being born with a disorder caused by abnormal genes  or chromosomes (genetic disorder). Chromosomes contain genes that control how your baby will develop in your womb. There are many different genetic disorders. Examples of genetic disorders that may be found through genetic testing include Down syndrome and cystic fibrosis. Gene changes (mutations) can be passed down through families. Genetic testing is offered to all women before or during pregnancy. You can choose whether to have genetic testing. Why is genetic testing done? Genetic testing is done during pregnancy to find out whether your child is at risk for a genetic disorder. Having genetic testing allows you to:  Discuss your test results and options with a genetic counselor.  Prepare for a baby that may be born with a genetic disorder. Learning about the disorder ahead of time helps you  be better prepared to manage it. Your health care providers can also be prepared in case your baby requires special care before or after birth.  Consider whether you want to continue with the pregnancy. In some cases, genetic testing may be done to learn about the traits a child will inherit. Types of genetic tests There are two basic types of genetic testing. Screening tests indicate whether your developing baby (fetus) is at higher risk for a genetic disorder. Diagnostic tests check actual fetal cells to diagnose a genetic disorder. Screening tests     Screening tests will not harm your baby. They are recommended for all pregnant women. Types of screening tests include:  Carrier screening. This test involves checking genes from both parents by testing their blood or saliva. The test checks to find out if the parents carry a genetic mutation that may be passed to a baby. In most cases, both parents must carry the mutation for a baby to be at risk.  First trimester screening. This test combines a blood test with sound wave imaging of your baby (fetal ultrasound). This screening test checks for a risk of  Down syndrome or other defects caused by having extra chromosomes. It also checks for defects of the heart, abdomen, or skeleton.  Second trimester screening also combines a blood test with a fetal ultrasound exam. It checks for a risk of genetic defects of the face, brain, spine, heart, or limbs.  Combined or sequential screening. This type of testing combines the results of first and second trimester screening. This type of testing may be more accurate than first or second trimester screening alone.  Cell-free DNA testing. This is a blood test that detects cells released by the placenta that get into the mother's blood. It can be used to check for a risk of Down syndrome, other extra chromosome syndromes, and disorders caused by abnormal numbers of sex chromosomes. This test can be done any time after 10 weeks of pregnancy.  Diagnostic tests Diagnostic tests carry slight risks of problems, including bleeding, infection, and loss of the pregnancy. These tests are done only if your baby is at risk for a genetic disorder. You may meet with a genetic counselor to discuss the risks and benefits before having diagnostic tests. Examples of diagnostic tests include:  Chorionic villus sampling (CVS). This involves a procedure to remove and test a sample of cells taken from the placenta. The procedure may be done between 10 and 12 weeks of pregnancy.  Amniocentesis. This involves a procedure to remove and test a sample of fluid (amniotic fluid) and cells from the sac that surrounds the developing baby. The procedure may be done between 15 and 20 weeks of pregnancy. What do the results mean? For a screening test:  If the results are negative, it often means that your child is not at higher risk. There is still a slight chance your child could have a genetic disorder.  If the results are positive, it does not mean your child will have a genetic disorder. It may mean that your child has a higher-than-normal  risk for a genetic disorder. In that case, you may want to talk with a genetic counselor about whether you should have diagnostic genetic tests. For a diagnostic test:  If the result is negative, it is unlikely that your child will have a genetic disorder.  If the test is positive for a genetic disorder, it is likely that your child will have the disorder. The test  may not tell how severe the disorder will be. Talk with your health care provider about your options. Questions to ask your health care provider Before talking to your health care provider about genetic testing, find out if there is a history of genetic disorders in your family. It may also help to know your family's ethnic origins. Then ask your health care provider the following questions:  Is my baby at risk for a genetic disorder?  What are the benefits of having genetic screening?  What tests are best for me and my baby?  What are the risks of each test?  If I get a positive result on a screening test, what is the next step?  Should I meet with a genetic counselor before having a diagnostic test?  Should my partner or other members of my family be tested?  How much do the tests cost? Will my insurance cover the testing? Summary  Genetic testing is done during pregnancy to find out whether your child is at risk for a genetic disorder.  Genetic testing is offered to all women before or during pregnancy. You can choose whether to have genetic testing.  There are two basic types of genetic testing. Screening tests indicate whether your developing baby (fetus) is at higher risk for a genetic disorder. Diagnostic tests check actual fetal cells to diagnose a genetic disorder.  If a diagnostic genetic test is positive, talk with your health care provider about your options. This information is not intended to replace advice given to you by your health care provider. Make sure you discuss any questions you have with your  health care provider. Document Revised: 07/25/2018 Document Reviewed: 06/18/2017 Elsevier Patient Education  2020 [employer].  How to Take Your Blood Pressure You can take your blood pressure at home with a machine. You may need to check your blood pressure at home:  To check if you have high blood pressure (hypertension).  To check your blood pressure over time.  To make sure your blood pressure medicine is working. Supplies needed: You will need a blood pressure machine, or monitor. You can buy one at a drugstore or online. When choosing one:  Choose one with an arm cuff.  Choose one that wraps around your upper arm. Only one finger should fit between your arm and the cuff.  Do not choose one that measures your blood pressure from your wrist or finger. Your doctor can suggest a monitor. How to prepare Avoid these things for 30 minutes before checking your blood pressure:  Drinking caffeine.  Drinking alcohol.  Eating.  Smoking.  Exercising. Five minutes before checking your blood pressure:  Pee.  Sit in a dining chair. Avoid sitting in a soft couch or armchair.  Be quiet. Do not talk. How to take your blood pressure Follow the instructions that came with your machine. If you have a digital blood pressure monitor, these may be the instructions: 1. Sit up straight. 2. Place your feet on the floor. Do not cross your ankles or legs. 3. Rest your left arm at the level of your heart. You may rest it on a table, desk, or chair. 4. Pull up your shirt sleeve. 5. Wrap the blood pressure cuff around the upper part of your left arm. The cuff should be 1 inch (2.5 cm) above your elbow. It is best to wrap the cuff around bare skin. 6. Fit the cuff snugly around your arm. You should be able to place only one  finger between the cuff and your arm. 7. Put the cord inside the groove of your elbow. 8. Press the power button. 9. Sit quietly while the cuff fills with air and loses  air. 10. Write down the numbers on the screen. 11. Wait 2-3 minutes and then repeat steps 1-10. What do the numbers mean? Two numbers make up your blood pressure. The first number is called systolic pressure. The second is called diastolic pressure. An example of a blood pressure reading is "120 over 80" (or 120/80). If you are an adult and do not have a medical condition, use this guide to find out if your blood pressure is normal: Normal  First number: below 120.  Second number: below 80. Elevated  First number: 120-129.  Second number: below 80. Hypertension stage 1  First number: 130-139.  Second number: 80-89. Hypertension stage 2  First number: 140 or above.  Second number: 90 or above. Your blood pressure is above normal even if only the top or bottom number is above normal. Follow these instructions at home:  Check your blood pressure as often as your doctor tells you to.  Take your monitor to your next doctor's appointment. Your doctor will: ? Make sure you are using it correctly. ? Make sure it is working right.  Make sure you understand what your blood pressure numbers should be.  Tell your doctor if your medicines are causing side effects. Contact a doctor if:  Your blood pressure keeps being high. Get help right away if:  Your first blood pressure number is higher than 180.  Your second blood pressure number is higher than 120. This information is not intended to replace advice given to you by your health care provider. Make sure you discuss any questions you have with your health care provider. Document Revised: 03/16/2017 Document Reviewed: 09/10/2015 Elsevier Patient Education  2020 [employer].  Warning Signs During Pregnancy A pregnancy lasts about 40 weeks, starting from the first day of your last period until the baby is born. Pregnancy is divided into three phases called trimesters.  The first trimester refers to week 1 through week 13 of  pregnancy.  The second trimester is the start of week 14 through the end of week 27.  The third trimester is the start of week 28 until you deliver your baby. During each trimester of pregnancy, certain signs and symptoms may indicate a problem. Talk with your health care provider about your current health and any medical conditions you have. Make sure you know the symptoms that you should watch for and report. How does this affect me?  Warning signs in the first trimester While some changes during the first trimester may be uncomfortable, most do not represent a serious problem. Let your health care provider know if you have any of the following warning signs in the first trimester:  You cannot eat or drink without vomiting, and this lasts for longer than a day.  You have vaginal bleeding or spotting along with menstrual-like cramping.  You have diarrhea for longer than a day.  You have a fever or other signs of infection, such as: ? Pain or burning when you urinate. ? Foul smelling or thick or yellowish vaginal discharge. Warning signs in the second trimester As your baby grows and changes during the second trimester, there are additional signs and symptoms that may indicate a problem. These include:  Signs and symptoms of infection, including a fever.  Signs or symptoms of a  miscarriage or preterm labor, such as regular contractions, menstrual-like cramping, or lower abdominal pain.  Bloody or watery vaginal discharge or obvious vaginal bleeding.  Feeling like your heart is pounding.  Having trouble breathing.  Nausea, vomiting, or diarrhea that lasts for longer than a day.  Craving non-food items, such as clay, chalk, or dirt. This may be a sign of a very treatable medical condition called pica. Later in your second trimester, watch for signs and symptoms of a serious medical condition called preeclampsia.These include:  Changes in your vision.  A severe headache that does  not go away.  Nausea and vomiting. It is also important to notice if your baby stops moving or moves less than usual during this time. Warning signs in the third trimester As you approach the third trimester, your baby is growing and your body is preparing for the birth of your baby. In your third trimester, be sure to let your health care provider know if:  You have signs and symptoms of infection, including a fever.  You have vaginal bleeding.  You notice that your baby is moving less than usual or is not moving.  You have nausea, vomiting, or diarrhea that lasts for longer than a day.  You have a severe headache that does not go away.  You have vision changes, including seeing spots or having blurry or double vision.  You have increased swelling in your hands or face. How does this affect my baby? Throughout your pregnancy, always report any of the warning signs of a problem to your health care provider. This can help prevent complications that may affect your baby, including:  Increased risk for premature birth.  Infection that may be transmitted to your baby.  Increased risk for stillbirth. Contact a health care provider if:  You have any of the warning signs of a problem for the current trimester of your pregnancy.  Any of the following apply to you during any trimester of pregnancy: ? You have strong emotions, such as sadness or anxiety, that interfere with work or personal relationships. ? You feel unsafe in your home and need help finding a safe place to live. ? You are using tobacco products, alcohol, or drugs and you need help to stop. Get help right away if: You have signs or symptoms of labor before 37 weeks of pregnancy. These include:  Contractions that are 5 minutes or less apart, or that increase in frequency, intensity, or length.  Sudden, sharp abdominal pain or low back pain.  Uncontrolled gush or trickle of fluid from your vagina. Summary  A  pregnancy lasts about 40 weeks, starting from the first day of your last period until the baby is born. Pregnancy is divided into three phases called trimesters. Each trimester has warning signs to watch for.  Always report any warning signs to your health care provider in order to prevent complications that may affect both you and your baby.  Talk with your health care provider about your current health and any medical conditions you have. Make sure you know the symptoms that you should watch for and report. This information is not intended to replace advice given to you by your health care provider. Make sure you discuss any questions you have with your health care provider. Document Revised: 07/23/2018 Document Reviewed: 01/18/2017 Elsevier Patient Education  2020 [employer].

## 2019-12-09 NOTE — Progress Notes (Signed)
   PRENATAL INTAKE SUMMARY  Ms. Negrete presents today New OB Nurse Interview.  OB History    Gravida  2   Para      Term      Preterm      AB  1   Living        SAB  1   TAB      Ectopic      Multiple      Live Births             I have reviewed the patient's medical, obstetrical, social, and family histories, medications, and available lab results.  SUBJECTIVE She has no unusual complaints  OBJECTIVE Initial nurse interview for history and labs (New OB).  EDD: unsure due to having normal cycle in June and spotting in July. GA: [redacted]w[redacted]d per June cycle [id number]  GENERAL APPEARANCE: alert, well appearing, in no apparent distress, oriented to person, place and time   ASSESSMENT Normal pregnancy  PLAN Prenatal care-[facility] P[facility]/HIV  OB Urine Culture GC/CT (urine) HgbEval/SMA/CF ([facility]) Panorama A1C Ultrasound <14 wk to confirm dating Rx for BP monitor and weight scale sent to [facility] Patient to sign up for Babyscripts Continue PNV  Clovis Pu, RN

## 2019-12-10 ENCOUNTER — Encounter: Payer: Self-pay | Admitting: Student

## 2019-12-10 ENCOUNTER — Telehealth: Payer: Self-pay | Admitting: *Deleted

## 2019-12-10 DIAGNOSIS — A599 Trichomoniasis, unspecified: Secondary | ICD-10-CM

## 2019-12-10 DIAGNOSIS — Z3480 Encounter for supervision of other normal pregnancy, unspecified trimester: Secondary | ICD-10-CM

## 2019-12-10 DIAGNOSIS — A5901 Trichomonal vulvovaginitis: Secondary | ICD-10-CM | POA: Insufficient documentation

## 2019-12-10 LAB — OBSTETRIC PANEL, INCLUDING HIV
Antibody Screen: NEGATIVE
Basophils Absolute: 0 10*3/uL (ref 0.0–0.2)
Basos: 1 %
EOS (ABSOLUTE): 0 10*3/uL (ref 0.0–0.4)
Eos: 0 %
HIV Screen 4th Generation wRfx: NONREACTIVE
Hematocrit: 41.9 % (ref 34.0–46.6)
Hemoglobin: 13.6 g/dL (ref 11.1–15.9)
Hepatitis B Surface Ag: NEGATIVE
Immature Grans (Abs): 0 10*3/uL (ref 0.0–0.1)
Immature Granulocytes: 1 %
Lymphocytes Absolute: 1.9 10*3/uL (ref 0.7–3.1)
Lymphs: 36 %
MCH: 26.8 pg (ref 26.6–33.0)
MCHC: 32.5 g/dL (ref 31.5–35.7)
MCV: 83 fL (ref 79–97)
Monocytes Absolute: 0.7 10*3/uL (ref 0.1–0.9)
Monocytes: 13 %
Neutrophils Absolute: 2.6 10*3/uL (ref 1.4–7.0)
Neutrophils: 49 %
Platelets: 247 10*3/uL (ref 150–450)
RBC: 5.07 x10E6/uL (ref 3.77–5.28)
RDW: 15 % (ref 11.7–15.4)
RPR Ser Ql: NONREACTIVE
Rh Factor: POSITIVE
Rubella Antibodies, IGG: 2.56 index (ref >0.99)
WBC: 5.2 10*3/uL (ref 3.4–10.8)

## 2019-12-10 LAB — URINE CYTOLOGY ANCILLARY ONLY
Chlamydia: NEGATIVE
Comment: NEGATIVE
Comment: NEGATIVE
Comment: NORMAL
Neisseria Gonorrhea: NEGATIVE
Trichomonas: POSITIVE — AB

## 2019-12-10 LAB — HEMOGLOBIN A1C
Est. average glucose Bld gHb Est-mCnc: <74 mg/dL
Hgb A1c MFr Bld: <4.2 % — ABNORMAL LOW (ref 4.8–5.6)

## 2019-12-10 MED ORDER — METRONIDAZOLE 500 MG PO TABS: 2000.0000 mg | ORAL_TABLET | Freq: Once | ORAL | 0 refills | Status: AC

## 2019-12-10 NOTE — Telephone Encounter (Signed)
-----   Message from Brianna Horn, NP sent at 12/10/2019  3:03 PM EDT ----- Please notify patient of her positive trichomonas. She needs treatment with 2000 mg of flagyl x 1 dose.

## 2019-12-10 NOTE — Telephone Encounter (Signed)
Patient called in regards to urine culture. Patient verified DOB. Patient informed of +trich and need for treatment. Advised patient to take Metronidazole 2000 mg PO x 1. Avoid sex until patient has been tested/treated. Will need TOC in one month. Reviewed other labs with patient.  Clovis Pu, RN

## 2019-12-11 LAB — URINE CULTURE, OB REFLEX

## 2019-12-11 LAB — CULTURE, OB URINE

## 2019-12-16 ENCOUNTER — Ambulatory Visit
Admission: RE | Admit: 2019-12-16 | Discharge: 2019-12-16 | Disposition: A | Discharge status: Home / Self Care | Payer: Medicaid Other | Source: Ambulatory Visit | Attending: Student | Admitting: Student

## 2019-12-16 ENCOUNTER — Other Ambulatory Visit: Payer: Self-pay

## 2019-12-16 DIAGNOSIS — Z3480 Encounter for supervision of other normal pregnancy, unspecified trimester: Secondary | ICD-10-CM

## 2019-12-16 DIAGNOSIS — Z3A08 8 weeks gestation of pregnancy: Secondary | ICD-10-CM | POA: Diagnosis not present

## 2019-12-16 DIAGNOSIS — O26841 Uterine size-date discrepancy, first trimester: Secondary | ICD-10-CM | POA: Diagnosis not present

## 2019-12-16 DIAGNOSIS — Z789 Other specified health status: Secondary | ICD-10-CM | POA: Diagnosis present

## 2019-12-16 DIAGNOSIS — N854 Malposition of uterus: Secondary | ICD-10-CM | POA: Diagnosis not present

## 2019-12-17 ENCOUNTER — Encounter: Payer: Self-pay | Admitting: General Practice

## 2019-12-19 ENCOUNTER — Encounter: Payer: Self-pay | Admitting: General Practice

## 2019-12-24 ENCOUNTER — Encounter: Payer: Medicaid Other | Admitting: Student

## 2019-12-24 DIAGNOSIS — Z3481 Encounter for supervision of other normal pregnancy, first trimester: Secondary | ICD-10-CM | POA: Diagnosis not present

## 2019-12-26 ENCOUNTER — Ambulatory Visit (HOSPITAL_COMMUNITY)
Admission: EM | Admit: 2019-12-26 | Discharge: 2019-12-26 | Disposition: A | Discharge status: Home / Self Care | Payer: Medicaid Other | Attending: Urgent Care | Admitting: Urgent Care

## 2019-12-26 ENCOUNTER — Other Ambulatory Visit: Payer: Self-pay

## 2019-12-26 ENCOUNTER — Encounter (HOSPITAL_COMMUNITY): Payer: Self-pay

## 2019-12-26 DIAGNOSIS — R1032 Left lower quadrant pain: Secondary | ICD-10-CM | POA: Diagnosis not present

## 2019-12-26 DIAGNOSIS — Z1152 Encounter for screening for COVID-19: Secondary | ICD-10-CM | POA: Insufficient documentation

## 2019-12-26 DIAGNOSIS — Z20822 Contact with and (suspected) exposure to covid-19: Secondary | ICD-10-CM | POA: Insufficient documentation

## 2019-12-26 DIAGNOSIS — R519 Headache, unspecified: Secondary | ICD-10-CM | POA: Insufficient documentation

## 2019-12-26 NOTE — ED Triage Notes (Signed)
Patient presents to Urgent Care with complaints of headache for about a week. Patient reports she just found out today that someone she works closely with has been hospitalized for covid. Pt is [redacted] weeks pregnant. Pt has been vaccinated for covid.

## 2019-12-26 NOTE — Discharge Instructions (Addendum)
I do feel you should seen further evaluation [facility] for your abdominal pain.  Tylenol as needed for headache.  Covid testing is pending. Due to timing of testing and when you were last around the exposure it is possible that you get a false positive.  Please seek testing 5 days following exposure or if develop symptoms.

## 2019-12-26 NOTE — ED Provider Notes (Signed)
[facility]    CSN: [id number] Arrival date & time: 12/26/19  1849      History   Chief Complaint Chief Complaint  Patient presents with   Headache    HPI Brianna Lee is a 29 y.o. female.   Brianna Lee presents with complaints of headache as well as LLQ abdominal pain. Laying on her left side increases her pain. Abdominal pain for the past 4 days. She is pregnant. She went to the OB on 8/31. Her next appointment is 9/23. She has had an ultrasound, she is [redacted] weeks pregnant. Ultrasound without abnormal findings- intrauterine viable pregnancy. No vaginal bleeding. Pain is primarily when she is trying to go to sleep. Headache which started 9/6. She was notified today that a coworker of hers tested positive for covid. She has been around this coworker. She was around them last 2-3 days ago. No URI symptoms. Has been vaccinated for covid-19. No urinary symptoms. Feels the LLQ abdominal pain currently. No vision changes. No extremity edema. She has had normal bowel movements. She is primarily concerned about covid-19 testing today.    ROS per HPI, negative if not otherwise mentioned.       Past Medical History:  Diagnosis Date   Scoliosis     Patient Active Problem List   Diagnosis Date Noted   Trichomonal vaginitis during pregnancy in first trimester 12/10/2019   Supervision of other normal pregnancy, antepartum 12/09/2019    Past Surgical History:  Procedure Laterality Date   GALLBLADDER SURGERY      OB History    Gravida  2   Para      Term      Preterm      AB  1   Living        SAB  1   TAB      Ectopic      Multiple      Live Births               Home Medications    Prior to Admission medications   Medication Sig Start Date End Date Taking? Authorizing Provider  Blood Pressure Monitoring (BLOOD PRESSURE MONITOR AUTOMAT) DEVI 1 Device by Does not apply route daily. Automatic blood pressure cuff regular size. To monitor blood  pressure regularly at home. ICD-10 code:Z34.90 12/09/19   [name], NP  EPINEPHrine 0.3 mg/0.3 mL IJ SOAJ injection Inject 0.3 mg into the muscle See admin instructions. As directed for anaphylaxis Patient not taking: Reported on 09/26/2019 04/11/16   [provider]  Misc. Devices (GOJJI WEIGHT SCALE) MISC 1 Device by Does not apply route daily as needed. To weight self daily as needed at home. ICD-10 code: O09.90 12/09/19   [name], NP  Prenatal Vit-Fe Fumarate-FA (PREPLUS) 27-1 MG TABS Take 1 tablet by mouth daily. 09/26/19   Gerrit Heck, CNM    Family History Family History  Problem Relation Age of Onset   Hypertension Father    Diabetes Father     Social History Social History   Tobacco Use   Smoking status: Former Smoker    Types: Cigarettes   Smokeless tobacco: Never Used   Tobacco comment: 2 cigarettes a day   Vaping Use   Vaping Use: Never used  Substance Use Topics   Alcohol use: Not Currently   Drug use: Never     Allergies   Naproxen   Review of Systems Review of Systems   Physical Exam Triage Vital Signs ED  Triage Vitals  Enc Vitals Group     BP 12/26/19 1952 120/69     Pulse Rate 12/26/19 1952 80     Resp 12/26/19 1952 19     Temp 12/26/19 1952 98.1 F (36.7 C)     Temp Source 12/26/19 1952 Oral     SpO2 12/26/19 1952 100 %     Weight --      Height --      Head Circumference --      Peak Flow --      Pain Score 12/26/19 1950 8     Pain Loc --      Pain Edu? --      Excl. in GC? --    No data found.  Updated Vital Signs BP 120/69 (BP Location: Right Arm)    Pulse 80    Temp 98.1 F (36.7 C) (Oral)    Resp 19    LMP 09/23/2019    SpO2 100%   Visual Acuity Right Eye Distance:   Left Eye Distance:   Bilateral Distance:    Right Eye Near:   Left Eye Near:    Bilateral Near:     Physical Exam Constitutional:      General: She is not in acute distress.    Appearance: She is well-developed.   Cardiovascular:     Rate and Rhythm: Normal rate.  Pulmonary:     Effort: Pulmonary effort is normal.  Abdominal:     Tenderness: There is abdominal tenderness in the left lower quadrant.  Skin:    General: Skin is warm and dry.  Neurological:     Mental Status: She is alert and oriented to person, place, and time.      UC Treatments / Results  Labs (all labs ordered are listed, but only abnormal results are displayed) Labs Reviewed  SARS CORONAVIRUS 2 (TAT 6-24 HRS)    EKG   Radiology No results found.  Procedures Procedures (including critical care time)  Medications Ordered in UC Medications - No data to display  Initial Impression / Assessment and Plan / UC Course  I have reviewed the triage vital signs and the nursing notes.  Pertinent labs & imaging results that were available during my care of the patient were reviewed by me and considered in my medical decision making (see chart for details).     No neurological symptoms. BP normal today. [redacted] weeks pregnant. No bleeding. Abdominal pain for the past 4 days, feels it currently. No bleeding. Suspect round ligament pain, but as it is Friday evening and with persistent pain for the past 4 days I do recommend going to the [facility] hospital for further evaluation. covid screening collected, discussed risk of false negative due to timing of exposure. Return precautions provided. Patient verbalized understanding and agreeable to plan.   Final Clinical Impressions(s) / UC Diagnoses   Final diagnoses:  LLQ abdominal pain  Acute nonintractable headache, unspecified headache type  Encounter for screening for COVID-19  Exposure to COVID-19 virus     Discharge Instructions     I do feel you should seen further evaluation in the [facility] hospital for your abdominal pain.  Tylenol as needed for headache.  Covid testing is pending. Due to timing of testing and when you were last around the exposure it is possible that you  get a false positive.  Please seek testing 5 days following exposure or if develop symptoms.     ED Prescriptions  None     PDMP not reviewed this encounter.   Georgetta Haber, NP 12/27/19 1655

## 2019-12-27 LAB — SARS CORONAVIRUS 2 (TAT 6-24 HRS): SARS Coronavirus 2: NEGATIVE

## 2020-01-01 ENCOUNTER — Encounter: Payer: Self-pay | Admitting: General Practice

## 2020-01-08 ENCOUNTER — Encounter: Payer: Self-pay | Admitting: General Practice

## 2020-01-08 ENCOUNTER — Encounter: Payer: Self-pay | Admitting: Obstetrics and Gynecology

## 2020-01-08 ENCOUNTER — Other Ambulatory Visit: Payer: Self-pay

## 2020-01-08 ENCOUNTER — Ambulatory Visit (INDEPENDENT_AMBULATORY_CARE_PROVIDER_SITE_OTHER): Payer: Medicaid Other | Admitting: Obstetrics and Gynecology

## 2020-01-08 ENCOUNTER — Other Ambulatory Visit (HOSPITAL_COMMUNITY)
Admission: RE | Admit: 2020-01-08 | Discharge: 2020-01-08 | Disposition: A | Discharge status: Home / Self Care | Payer: Medicaid Other | Source: Ambulatory Visit | Attending: Student | Admitting: Student

## 2020-01-08 VITALS — BP 115/76 | HR 111 | Temp 98.2°F | Wt 244.2 lb

## 2020-01-08 DIAGNOSIS — Z3480 Encounter for supervision of other normal pregnancy, unspecified trimester: Secondary | ICD-10-CM

## 2020-01-08 DIAGNOSIS — A5901 Trichomonal vulvovaginitis: Secondary | ICD-10-CM | POA: Diagnosis not present

## 2020-01-08 DIAGNOSIS — O23591 Infection of other part of genital tract in pregnancy, first trimester: Secondary | ICD-10-CM | POA: Diagnosis not present

## 2020-01-08 DIAGNOSIS — B372 Candidiasis of skin and nail: Secondary | ICD-10-CM

## 2020-01-08 DIAGNOSIS — Z3A11 11 weeks gestation of pregnancy: Secondary | ICD-10-CM

## 2020-01-08 DIAGNOSIS — L739 Follicular disorder, unspecified: Secondary | ICD-10-CM

## 2020-01-08 DIAGNOSIS — O99210 Obesity complicating pregnancy, unspecified trimester: Secondary | ICD-10-CM

## 2020-01-08 MED ORDER — ASPIRIN 81 MG PO CHEW: 81.0000 mg | CHEWABLE_TABLET | Freq: Every day | ORAL | 6 refills | Status: DC

## 2020-01-09 LAB — CERVICOVAGINAL ANCILLARY ONLY
Bacterial Vaginitis (gardnerella): POSITIVE — AB
Candida Glabrata: NEGATIVE
Candida Vaginitis: NEGATIVE
Chlamydia: NEGATIVE
Comment: NEGATIVE
Comment: NEGATIVE
Comment: NEGATIVE
Comment: NEGATIVE
Comment: NEGATIVE
Comment: NORMAL
Neisseria Gonorrhea: NEGATIVE
Trichomonas: NEGATIVE

## 2020-01-10 DIAGNOSIS — L739 Follicular disorder, unspecified: Secondary | ICD-10-CM | POA: Insufficient documentation

## 2020-01-10 DIAGNOSIS — O99210 Obesity complicating pregnancy, unspecified trimester: Secondary | ICD-10-CM | POA: Insufficient documentation

## 2020-01-10 MED ORDER — CLINDAMYCIN PHOSPHATE 1 % EX GEL: Freq: Two times a day (BID) | CUTANEOUS | 11 refills | Status: DC

## 2020-01-10 MED ORDER — NYSTATIN 100000 UNIT/GM EX POWD: 1.0000 "application " | Freq: Three times a day (TID) | CUTANEOUS | 0 refills | Status: DC

## 2020-01-10 NOTE — Progress Notes (Signed)
INITIAL OBSTETRICAL VISIT Patient name: Brianna Lee MRN [id number]  Date of birth: 1990/11/29 Chief Complaint:   Initial Prenatal Visit  History of Present Illness:   Brianna Lee is a 29 y.o. G18P0010 African American female at [redacted]w[redacted]d by 8 wk U/S with an Estimated Date of Delivery: 07/23/20 being seen today for her initial obstetrical visit.  Her obstetrical history is significant for obesity and trichomonas in first trimester. This is an unplanned pregnancy. She and the father of the baby (FOB) "Berna Spare" live together. She has a support system that consists of the FOB/famil/friends. Today she reports rash under belly and painful bump in RT groin.   Patient's last menstrual period was 09/23/2019. Last pap 09/26/2019. Results were: normal Review of Systems:   Pertinent items are noted in HPI Denies cramping/contractions, leakage of fluid, vaginal bleeding, abnormal vaginal discharge w/ itching/odor/irritation, headaches, visual changes, shortness of breath, chest pain, abdominal pain, severe nausea/vomiting, or problems with urination or bowel movements unless otherwise stated above.  Pertinent History Reviewed:  Reviewed past medical,surgical, social, obstetrical and family history.  Reviewed problem list, medications and allergies. OB History  Gravida Para Term Preterm AB Living  2       1    SAB TAB Ectopic Multiple Live Births  1            # Outcome Date GA Lbr Len/2nd Weight Sex Delivery Anes PTL Lv  2 Current           1 SAB 07/14/19 [redacted]w[redacted]d    SAB      Physical Assessment:   Vitals:   01/08/20 1418  BP: 115/76  Pulse: (!) 111  Temp: 98.2 F (36.8 C)  Weight: 244 lb 3.2 oz (110.8 kg)  Body mass index is 41.92 kg/m.       Physical Examination:  General appearance - well appearing, and in no distress  Mental status - alert, oriented to person, place, and time  Psych:  She has a normal mood and affect  Skin - warm and dry, normal color, no suspicious lesions noted  Chest -  effort normal, all lung fields clear to auscultation bilaterally  Heart - normal rate and regular rhythm  Abdomen - soft, nontender  Extremities:  No swelling or varicosities noted  Pelvic - VULVA: normal appearing vulva with no masses, tenderness. Suspected folliculitis in RT groin area. VAGINA: normal appearing vagina with normal color and discharge, no lesions.   CERVIX: normal appearing cervix without discharge or lesions, no CMT  Thin prep pap is not done   FHTs not obtained by doppler --> Informal Bedside U/S:  Patient informed that the ultrasound is considered a limited OB ultrasound and is not intended to be a complete ultrasound exam.  Patient also informed that the ultrasound is not being completed with the intent of assessing for fetal or placental anomalies or any pelvic abnormalities.  Explained that the purpose of today's ultrasound is to assess for viability.  Baby was found to be very active; appropriately for gestational age. Cardiac activity visualized but unable to measure due to maternal habitus and fetal activity. Patient and FOB reassured after ultrasound. Patient acknowledges the purpose of the exam and the limitations of the study.     Assessment & Plan:  1) Low-Risk Pregnancy G2P0010 at [redacted]w[redacted]d with an Estimated Date of Delivery: 07/23/20   2) Initial OB visit - Welcomed to practice and introduced self to patient in addition to discussing other advanced practice providers that she  may be seeing at this practice - Congratulated patient - Anticipatory guidance on upcoming appointments - Educated on COVID19 and pregnancy and the integration of virtual appointments  - Educated on babyscripts app- patient reports she has not received email, encouraged to look in spam folder and to call office if she still has not received email - patient verbalizes understanding    3) Trichomonal vaginitis during pregnancy in first trimester - TOC today - partner not treated, but no unprotected  SI since conception per patient - Cervicovaginal ancillary only( [place]) - Rx for partner expedited tx given today  4) Supervision of other normal pregnancy, antepartum - Cervicovaginal ancillary only( [facility]) - [place] MFM OB DETAIL +14 WK; Future  5) [redacted] weeks gestation of pregnancy  6) Obesity affecting pregnancy, antepartum - Rx for aspirin 81 MG chewable tablet; Chew 1 tablet (81 mg total) by mouth daily.  Dispense: 30 tablet; Refill: 6 - Referral to Nutrition and Diabetes Services  7) Folliculitis - RT groin area  - Rx for clindamycin (CLINDAGEL) 1 % gel BID x 7 days  8) Cutaneous candidiasis  - Rx for nystatin (MYCOSTATIN/NYSTOP) powder TID x 7 days     Meds:  Meds ordered this encounter  Medications  . aspirin 81 MG chewable tablet    Sig: Chew 1 tablet (81 mg total) by mouth daily.    Dispense:  30 tablet    Refill:  6    Order Specific Question:   Supervising Provider    Answer:   Reva Bores [[id number]]  . clindamycin (CLINDAGEL) 1 % gel    Sig: Apply topically 2 (two) times daily. Apply to affected groin area    Dispense:  30 g    Refill:  11    Order Specific Question:   Supervising Provider    Answer:   Reva Bores [[id number]]  . nystatin (MYCOSTATIN/NYSTOP) powder    Sig: Apply 1 application topically 3 (three) times daily. Apply on affected area under abdomen    Dispense:  15 g    Refill:  0    Order Specific Question:   Supervising Provider    Answer:   Reva Bores [[id number]]    Initial labs obtained Continue prenatal vitamins Reviewed n/v relief measures and warning s/s to report Reviewed recommended weight gain based on pre-gravid BMI Encouraged well-balanced diet Genetic Screening discussed: ordered Cystic fibrosis, SMA, Fragile X screening discussed ordered The nature of  - [facility] with multiple MDs and other Advanced Practice Providers was explained to patient; also emphasized that residents, students are  part of our team.  Discussed optimized OB schedule and video visits. Advised can have an in-office visit whenever she feels she needs to be seen.  Does not have own BP cuff. BP cuff Rx faxed today. Explained to patient that BP will be mailed to her house. Check BP weekly, let us know if >140/90. Advised to call during normal business hours and there is an after-hours nurse line available.    Follow-up: Return in about 6 weeks (around 02/19/2020) for Return OB - My Chart video.   Orders Placed This Encounter  Procedures  . [place] MFM OB DETAIL +14 WK  . Referral to Nutrition and Diabetes Services    Raelyn Mora MSN, [place] 01/08/2020

## 2020-01-12 ENCOUNTER — Telehealth: Payer: Self-pay | Admitting: *Deleted

## 2020-01-12 ENCOUNTER — Other Ambulatory Visit: Payer: Self-pay | Admitting: *Deleted

## 2020-01-12 DIAGNOSIS — Z3480 Encounter for supervision of other normal pregnancy, unspecified trimester: Secondary | ICD-10-CM

## 2020-01-12 DIAGNOSIS — B9689 Other specified bacterial agents as the cause of diseases classified elsewhere: Secondary | ICD-10-CM

## 2020-01-12 MED ORDER — METRONIDAZOLE 500 MG PO TABS: 500.0000 mg | ORAL_TABLET | Freq: Two times a day (BID) | ORAL | 0 refills | Status: DC

## 2020-01-12 MED ORDER — PREPLUS 27-1 MG PO TABS: 1.0000 | ORAL_TABLET | Freq: Every day | ORAL | 3 refills | Status: DC

## 2020-01-12 NOTE — Progress Notes (Signed)
Patient called requesting refill on PNV. Refill sent to pharmacy.  Clovis Pu, RN

## 2020-01-12 NOTE — Telephone Encounter (Signed)
-----   Message from Raelyn Mora, [place] sent at 01/10/2020 12:23 AM EDT ----- Please treat for BV

## 2020-02-05 ENCOUNTER — Encounter: Payer: Medicaid Other | Attending: Obstetrics and Gynecology | Admitting: Dietician

## 2020-02-05 ENCOUNTER — Encounter: Payer: Self-pay | Admitting: Dietician

## 2020-02-05 ENCOUNTER — Other Ambulatory Visit: Payer: Self-pay

## 2020-02-05 DIAGNOSIS — O99210 Obesity complicating pregnancy, unspecified trimester: Secondary | ICD-10-CM | POA: Insufficient documentation

## 2020-02-05 NOTE — Progress Notes (Signed)
Medical Nutrition Therapy   Primary concerns today: nutrition during pregnancy   Referral diagnosis: O99.210- obesity affecting pregnancy Preferred learning style: no preference indicated Learning readiness: ready   NUTRITION ASSESSMENT   Clinical Medical Hx: obesity Notable Signs/Symptoms: N/A  Lifestyle & Dietary Hx Patient states she stopped smoking when she found out she was pregnant. States she has been focusing on making better food choices and monitoring her portion sizes. Typical meal pattern is 3 meals per day plus snacks. Works at [employer].   Estimated daily fluid intake: 64+ oz Supplements: prenatal Current average weekly physical activity: walking 30 mins/day 7 days/week   24-Hr Dietary Recall First Meal: [place] sausage + grits + eggs Snack: cereal Second Meal: salad + grilled chicken Snack: ice cream Third Meal: 1/2 grilled chicken sandwich  Snack: - Beverages: water, OJ, cranberry juice, Gatorade   NUTRITION DIAGNOSIS  Increased protein needs (NI-5.1) related to pregnancy as evidenced by recommendations for increased protein intake for pregnant women to support healthy pregnancy.    NUTRITION INTERVENTION  Nutrition education (E-1) on the following topics:  . Pregnancy nutrition therapy   Handouts Provided Include   Summary of Nutritional Guidelines for Pregnant Women  Learning Style & Readiness for Change Teaching method utilized: Visual & Auditory  Demonstrated degree of understanding via: Teach Back  Barriers to learning/adherence to lifestyle change: None Identified   MONITORING & EVALUATION Dietary intake, weekly physical activity, and goals PRN.  Next Steps  Patient is to contact [facility] for follow up as needed.

## 2020-02-19 ENCOUNTER — Telehealth: Payer: Medicaid Other | Admitting: Obstetrics and Gynecology

## 2020-02-23 ENCOUNTER — Inpatient Hospital Stay (HOSPITAL_COMMUNITY)
Admission: AD | Admit: 2020-02-23 | Discharge: 2020-02-23 | Disposition: A | Discharge status: Home / Self Care | Payer: Medicaid Other | Attending: Family Medicine | Admitting: Family Medicine

## 2020-02-23 ENCOUNTER — Inpatient Hospital Stay (HOSPITAL_BASED_OUTPATIENT_CLINIC_OR_DEPARTMENT_OTHER): Payer: Medicaid Other

## 2020-02-23 ENCOUNTER — Encounter (HOSPITAL_COMMUNITY): Payer: Self-pay | Admitting: Family Medicine

## 2020-02-23 DIAGNOSIS — Z87891 Personal history of nicotine dependence: Secondary | ICD-10-CM | POA: Diagnosis not present

## 2020-02-23 DIAGNOSIS — R109 Unspecified abdominal pain: Secondary | ICD-10-CM

## 2020-02-23 DIAGNOSIS — O26892 Other specified pregnancy related conditions, second trimester: Secondary | ICD-10-CM | POA: Insufficient documentation

## 2020-02-23 DIAGNOSIS — Z7982 Long term (current) use of aspirin: Secondary | ICD-10-CM | POA: Insufficient documentation

## 2020-02-23 DIAGNOSIS — Z3A18 18 weeks gestation of pregnancy: Secondary | ICD-10-CM | POA: Diagnosis not present

## 2020-02-23 DIAGNOSIS — R1031 Right lower quadrant pain: Secondary | ICD-10-CM | POA: Insufficient documentation

## 2020-02-23 DIAGNOSIS — Z79899 Other long term (current) drug therapy: Secondary | ICD-10-CM | POA: Insufficient documentation

## 2020-02-23 LAB — URINALYSIS, ROUTINE W REFLEX MICROSCOPIC
Bilirubin Urine: NEGATIVE
Glucose, UA: NEGATIVE mg/dL
Hgb urine dipstick: NEGATIVE
Ketones, ur: NEGATIVE mg/dL
Nitrite: NEGATIVE
Protein, ur: NEGATIVE mg/dL
Specific Gravity, Urine: 1.006 (ref 1.005–1.030)
pH: 8 (ref 5.0–8.0)

## 2020-02-23 LAB — CBC WITH DIFFERENTIAL/PLATELET
Abs Immature Granulocytes: 0.08 10*3/uL — ABNORMAL HIGH (ref 0.00–0.07)
Basophils Absolute: 0 10*3/uL (ref 0.0–0.1)
Basophils Relative: 0 %
Eosinophils Absolute: 0 10*3/uL (ref 0.0–0.5)
Eosinophils Relative: 0 %
HCT: 37.8 % (ref 36.0–46.0)
Hemoglobin: 12.5 g/dL (ref 12.0–15.0)
Immature Granulocytes: 1 %
Lymphocytes Relative: 21 %
Lymphs Abs: 1.9 10*3/uL (ref 0.7–4.0)
MCH: 27.4 pg (ref 26.0–34.0)
MCHC: 33.1 g/dL (ref 30.0–36.0)
MCV: 82.9 fL (ref 80.0–100.0)
Monocytes Absolute: 1 10*3/uL (ref 0.1–1.0)
Monocytes Relative: 11 %
Neutro Abs: 6 10*3/uL (ref 1.7–7.7)
Neutrophils Relative %: 67 %
Platelets: 248 10*3/uL (ref 150–400)
RBC: 4.56 MIL/uL (ref 3.87–5.11)
RDW: 13.5 % (ref 11.5–15.5)
WBC: 8.9 10*3/uL (ref 4.0–10.5)
nRBC: 0 % (ref 0.0–0.2)

## 2020-02-23 NOTE — MAU Provider Note (Signed)
History     CSN: [id number]  Arrival date and time: 02/23/20 1510   First Provider Initiated Contact with Patient 02/23/20 1542      Chief Complaint  Patient presents with  . Abdominal Pain   HPI  Brianna Lee is a 29 y.o. G2P0010 at [redacted]w[redacted]d who presents via EMS for abdominal pain. Symptoms started this afternoon while at work. Reports pain in her RLQ that radiates to her low back. It comes & goes; lasts about 2 minutes at a times & occurs 2-3 times per hour. Denies fever/chills, n/v/d, dysuria, hematuria, vaginal bleeding, or LOF.   Location: abdomen Quality: sharp Severity: 6 when it occurs/10 on pain scale Duration: 3 hours Timing: 2-3 times per hour Modifying factors: none Associated signs and symptoms: none  OB History    Gravida  2   Para      Term      Preterm      AB  1   Living        SAB  1   TAB      Ectopic      Multiple      Live Births              Past Medical History:  Diagnosis Date  . Scoliosis     Past Surgical History:  Procedure Laterality Date  . GALLBLADDER SURGERY      Family History  Problem Relation Age of Onset  . Hypertension Father   . Diabetes Father     Social History   Tobacco Use  . Smoking status: Former Smoker    Types: Cigarettes  . Smokeless tobacco: Never Used  . Tobacco comment: 2 cigarettes a day   Vaping Use  . Vaping Use: Never used  Substance Use Topics  . Alcohol use: Not Currently  . Drug use: Never    Allergies:  Allergies  Allergen Reactions  . Naproxen Swelling    Medications Prior to Admission  Medication Sig Dispense Refill Last Dose  . aspirin 81 MG chewable tablet Chew 1 tablet (81 mg total) by mouth daily. 30 tablet 6   . Blood Pressure Monitoring (BLOOD PRESSURE MONITOR AUTOMAT) DEVI 1 Device by Does not apply route daily. Automatic blood pressure cuff regular size. To monitor blood pressure regularly at home. ICD-10 code:Z34.90 (Patient not taking: Reported on 01/08/2020) 1  each 0   . clindamycin (CLINDAGEL) 1 % gel Apply topically 2 (two) times daily. Apply to affected groin area 30 g 11   . EPINEPHrine 0.3 mg/0.3 mL IJ SOAJ injection Inject 0.3 mg into the muscle See admin instructions. As directed for anaphylaxis (Patient not taking: Reported on 09/26/2019)     . Misc. Devices (GOJJI WEIGHT SCALE) MISC 1 Device by Does not apply route daily as needed. To weight self daily as needed at home. ICD-10 code: O104.90 (Patient not taking: Reported on 01/08/2020) 1 each 0   . nystatin (MYCOSTATIN/NYSTOP) powder Apply 1 application topically 3 (three) times daily. Apply on affected area under abdomen 15 g 0   . Prenatal Vit-Fe Fumarate-FA (PREPLUS) 27-1 MG TABS Take 1 tablet by mouth daily. 90 tablet 3     Review of Systems  Constitutional: Negative.   Gastrointestinal: Positive for abdominal pain. Negative for diarrhea, nausea and vomiting.  Genitourinary: Negative.    Physical Exam   Blood pressure (!) 147/79, pulse (!) 101, temperature 98 F (36.7 C), temperature source Oral, resp. rate 17, last menstrual period 09/23/2019, unknown if  currently breastfeeding.  Physical Exam Vitals and nursing note reviewed. Exam conducted with a chaperone present.  Constitutional:      General: She is not in acute distress.    Appearance: She is well-developed.  Pulmonary:     Effort: Pulmonary effort is normal. No respiratory distress.  Abdominal:     General: Bowel sounds are normal. There is no distension.     Palpations: Abdomen is soft.     Tenderness: There is no abdominal tenderness. There is no right CVA tenderness or left CVA tenderness.  Genitourinary:    Comments: Cervix feels closed, but difficult exam due to patient intolerance Skin:    General: Skin is warm and dry.  Neurological:     Mental Status: She is alert.     MAU Course  Procedures Results for orders placed or performed during the hospital encounter of 02/23/20 (from the past 24 hour(s))   Urinalysis, Routine w reflex microscopic Urine, Clean Catch     Status: Abnormal   Collection Time: 02/23/20  3:51 PM  Result Value Ref Range   Color, Urine YELLOW YELLOW   APPearance HAZY (A) CLEAR   Specific Gravity, Urine 1.006 1.005 - 1.030   pH 8.0 5.0 - 8.0   Glucose, UA NEGATIVE NEGATIVE mg/dL   Hgb urine dipstick NEGATIVE NEGATIVE   Bilirubin Urine NEGATIVE NEGATIVE   Ketones, ur NEGATIVE NEGATIVE mg/dL   Protein, ur NEGATIVE NEGATIVE mg/dL   Nitrite NEGATIVE NEGATIVE   Leukocytes,Ua TRACE (A) NEGATIVE   RBC / HPF 0-5 0 - 5 RBC/hpf   WBC, UA 0-5 0 - 5 WBC/hpf   Bacteria, UA RARE (A) NONE SEEN   Squamous Epithelial / LPF 11-20 0 - 5   Mucus PRESENT   CBC with Differential/Platelet     Status: Abnormal   Collection Time: 02/23/20  4:12 PM  Result Value Ref Range   WBC 8.9 4.0 - 10.5 K/uL   RBC 4.56 3.87 - 5.11 MIL/uL   Hemoglobin 12.5 12.0 - 15.0 g/dL   HCT 31.5 36 - 46 %   MCV 82.9 80.0 - 100.0 fL   MCH 27.4 26.0 - 34.0 pg   MCHC 33.1 30.0 - 36.0 g/dL   RDW 17.6 16.0 - 73.7 %   Platelets 248 150 - 400 K/uL   nRBC 0.0 0.0 - 0.2 %   Neutrophils Relative % 67 %   Neutro Abs 6.0 1.7 - 7.7 K/uL   Lymphocytes Relative 21 %   Lymphs Abs 1.9 0.7 - 4.0 K/uL   Monocytes Relative 11 %   Monocytes Absolute 1.0 0.1 - 1.0 K/uL   Eosinophils Relative 0 %   Eosinophils Absolute 0.0 0.0 - 0.5 K/uL   Basophils Relative 0 %   Basophils Absolute 0.0 0.0 - 0.1 K/uL   Immature Granulocytes 1 %   Abs Immature Granulocytes 0.08 (H) 0.00 - 0.07 K/uL   No results found.  MDM FHT present via doppler.  Cervix feels closed but exam difficult d/t patient intolerance & body habitus. TVUS ordered for cervical length which is 3.3 cm.   U/a with small leuks. Patient without urinary complaints & no CVA tenderness. Urine culture sent.   Pain in RLQ. Intermittent. No other symptoms. She is afebrile with no leukocytosis. No TTP on exam. Low suspicion for appendicitis. Pt stable for discharge.    Assessment and Plan   1. Abdominal pain during pregnancy in second trimester   2. [redacted] weeks gestation of pregnancy    -  Reviewed reasons to return to MAU -urine culture pending  [name] 02/23/2020, 3:42 PM

## 2020-02-23 NOTE — MAU Note (Signed)
Pt is a G2P0 at 18.3 weeks c/o cramping on her right side and into her back.  Pt states that started at 1 pm and comes and goes.  Pain is a 9/10 pain scale.  NO LOF or bleeding at this time.

## 2020-02-23 NOTE — Discharge Instructions (Signed)
Abdominal Pain During Pregnancy  Belly (abdominal) pain is common during pregnancy. There are many possible causes. Most of the time, it is not a serious problem. Other times, it can be a sign that something is wrong with the pregnancy. Always tell your doctor if you have belly pain. Follow these instructions at home:  Do not have sex or put anything in your vagina until your pain goes away completely.  Get plenty of rest until your pain gets better.  Drink enough fluid to keep your pee (urine) pale yellow.  Take over-the-counter and prescription medicines only as told by your doctor.  Keep all follow-up visits as told by your doctor. This is important. Contact a doctor if:  Your pain continues or gets worse after resting.  You have lower belly pain that: ? Comes and goes at regular times. ? Spreads to your back. ? Feels like menstrual cramps.  You have pain or burning when you pee (urinate). Get help right away if:  You have a fever or chills.  You have vaginal bleeding.  You are leaking fluid from your vagina.  You are passing tissue from your vagina.  You throw up (vomit) for more than 24 hours.  You have watery poop (diarrhea) for more than 24 hours.  Your baby is moving less than usual.  You feel very weak or faint.  You have shortness of breath.  You have very bad pain in your upper belly. Summary  Belly (abdominal) pain is common during pregnancy. There are many possible causes.  If you have belly pain during pregnancy, tell your doctor right away.  Keep all follow-up visits as told by your doctor. This is important. This information is not intended to replace advice given to you by your health care provider. Make sure you discuss any questions you have with your health care provider. Document Revised: 07/22/2018 Document Reviewed: 07/06/2016 Elsevier Patient Education  2020 [employer]  

## 2020-02-25 LAB — CULTURE, OB URINE

## 2020-02-26 ENCOUNTER — Other Ambulatory Visit: Payer: Self-pay

## 2020-02-26 ENCOUNTER — Encounter: Payer: Self-pay | Admitting: Obstetrics and Gynecology

## 2020-02-26 ENCOUNTER — Telehealth (INDEPENDENT_AMBULATORY_CARE_PROVIDER_SITE_OTHER): Payer: Medicaid Other | Admitting: Obstetrics and Gynecology

## 2020-02-26 VITALS — BP 130/90 | HR 92 | Wt 263.2 lb

## 2020-02-26 DIAGNOSIS — Z3480 Encounter for supervision of other normal pregnancy, unspecified trimester: Secondary | ICD-10-CM

## 2020-02-26 DIAGNOSIS — E669 Obesity, unspecified: Secondary | ICD-10-CM

## 2020-02-26 DIAGNOSIS — Z3A18 18 weeks gestation of pregnancy: Secondary | ICD-10-CM

## 2020-02-26 DIAGNOSIS — O99210 Obesity complicating pregnancy, unspecified trimester: Secondary | ICD-10-CM

## 2020-02-26 DIAGNOSIS — O99212 Obesity complicating pregnancy, second trimester: Secondary | ICD-10-CM

## 2020-02-26 MED ORDER — ASPIRIN 81 MG PO CHEW: 81.0000 mg | CHEWABLE_TABLET | Freq: Every day | ORAL | 0 refills | Status: DC

## 2020-02-26 NOTE — Progress Notes (Signed)
   MY CHART VIDEO VIRTUAL OBSTETRICS VISIT ENCOUNTER NOTE  I connected with Brianna Lee on 02/26/20 at  4:10 PM EST by My Chart video on the way home from work (FOB driving) and verified that I am speaking with the correct person using two identifiers. Provider located at [employer] for [employer] at [place].   I discussed the limitations, risks, security and privacy concerns of performing an evaluation and management service by My Chart video and the availability of in person appointments. I also discussed with the patient that there may be a patient responsible charge related to this service. The patient expressed understanding and agreed to proceed.  Subjective:  Brianna Lee is a 29 y.o. G2P0010 at [redacted]w[redacted]d being followed for ongoing prenatal care.  She is currently monitored for the following issues for this low-risk pregnancy and has Supervision of other normal pregnancy, antepartum; Trichomonal vaginitis during pregnancy in first trimester; Obesity affecting pregnancy, antepartum; and Folliculitis on their problem list.  Patient reports no complaints. Reports fetal movement. Denies any contractions, bleeding or leaking of fluid.   The following portions of the patient's history were reviewed and updated as appropriate: allergies, current medications, past family history, past medical history, past social history, past surgical history and problem list.   Objective:   General:  Alert, oriented and cooperative.   Mental Status: Normal mood and affect perceived. Normal judgment and thought content.  Rest of physical exam deferred due to type of encounter  BP 130/90   Pulse 92   Wt 263 lb 3.2 oz (119.4 kg)   LMP 09/23/2019   BMI 45.18 kg/m  **Done by patient's own at home BP cuff and scale  Assessment and Plan:  Pregnancy: G2P0010 at [redacted]w[redacted]d  1. Supervision of other normal pregnancy, antepartum  - Advised to send BP and weight via My Chart - U/S tomorrow  2. Obesity affecting  pregnancy, antepartum  - Rx for aspirin (ASPIRIN CHILDRENS) 81 MG chewable tablet   Preterm labor symptoms and general obstetric precautions including but not limited to vaginal bleeding, contractions, leaking of fluid and fetal movement were reviewed in detail with the patient.  I discussed the assessment and treatment plan with the patient. The patient was provided an opportunity to ask questions and all were answered. The patient agreed with the plan and demonstrated an understanding of the instructions. The patient was advised to call back or seek an in-person office evaluation/go to MAU at [facility] for any urgent or concerning symptoms. Please refer to After Visit Summary for other counseling recommendations.   I provided 5 minutes of non-face-to-face time during this encounter. There was 5 minutes of chart review time spent prior to this encounter. Total time spent = 10 minutes.  Return in about 4 weeks (around 03/25/2020) for Return OB - My Chart video.  Future Appointments  Date Time Provider Department Center  03/25/2020 10:50 AM Raelyn Mora, CNM [facility] None  03/26/2020 10:30 AM [facility] NURSE [facility]  03/26/2020 10:45 AM [facility] US4 [facility]    Brianna Lee Arita Miss, CNM Center for [employer], [facility]

## 2020-02-27 ENCOUNTER — Other Ambulatory Visit: Payer: Self-pay | Admitting: *Deleted

## 2020-02-27 ENCOUNTER — Ambulatory Visit: Payer: Medicaid Other | Attending: Obstetrics and Gynecology

## 2020-02-27 DIAGNOSIS — Z3480 Encounter for supervision of other normal pregnancy, unspecified trimester: Secondary | ICD-10-CM | POA: Diagnosis not present

## 2020-02-27 DIAGNOSIS — Z148 Genetic carrier of other disease: Secondary | ICD-10-CM

## 2020-03-25 ENCOUNTER — Telehealth (INDEPENDENT_AMBULATORY_CARE_PROVIDER_SITE_OTHER): Payer: Medicaid Other | Admitting: Obstetrics and Gynecology

## 2020-03-25 ENCOUNTER — Encounter: Payer: Self-pay | Admitting: Obstetrics and Gynecology

## 2020-03-25 ENCOUNTER — Other Ambulatory Visit: Payer: Self-pay

## 2020-03-25 VITALS — BP 127/60 | HR 92 | Wt 266.0 lb

## 2020-03-25 DIAGNOSIS — Z3A22 22 weeks gestation of pregnancy: Secondary | ICD-10-CM | POA: Diagnosis not present

## 2020-03-25 DIAGNOSIS — Z3480 Encounter for supervision of other normal pregnancy, unspecified trimester: Secondary | ICD-10-CM | POA: Diagnosis not present

## 2020-03-25 NOTE — Patient Instructions (Signed)
Instructions for 2 hr GTT - advised to fast after midnight without anything to eat or drink (except for water), will have fasting blood drawn, drink the glucola drink (flavor choices: orange, fruit punch or lemon-lime), have a visit with a provider during the first hour of testing, wait in the lab waiting room to have blood drawn at 1 hour and then 2 hours after finishing glucola drink.  Glucose Tolerance Test During Pregnancy Why am I having this test? The glucose tolerance test (GTT) is done to check how your body processes sugar (glucose). This is one of several tests used to diagnose diabetes that develops during pregnancy (gestational diabetes mellitus). Gestational diabetes is a temporary form of diabetes that some women develop during pregnancy. It usually occurs during the second trimester of pregnancy and goes away after delivery. Testing (screening) for gestational diabetes usually occurs between 24 and 28 weeks of pregnancy. You may have the GTT test after having a 1-hour glucose screening test if the results from that test indicate that you may have gestational diabetes. You may also have this test if:  You have a history of gestational diabetes.  You have a history of giving birth to very large babies or have experienced repeated fetal loss (stillbirth).  You have signs and symptoms of diabetes, such as: ? Changes in your vision. ? Tingling or numbness in your hands or feet. ? Changes in hunger, thirst, and urination that are not otherwise explained by your pregnancy. What is being tested? This test measures the amount of glucose in your blood at different times during a period of 3 hours. This indicates how well your body is able to process glucose. What kind of sample is taken?  Blood samples are required for this test. They are usually collected by inserting a needle into a blood vessel. How do I prepare for this test?  For 3 days before your test, eat normally. Have plenty of  carbohydrate-rich foods.  Follow instructions from your health care provider about: ? Eating or drinking restrictions on the day of the test. You may be asked to not eat or drink anything other than water (fast) starting 8-10 hours before the test. ? Changing or stopping your regular medicines. Some medicines may interfere with this test. Tell a health care provider about:  All medicines you are taking, including vitamins, herbs, eye drops, creams, and over-the-counter medicines.  Any blood disorders you have.  Any surgeries you have had.  Any medical conditions you have. What happens during the test? First, your blood glucose will be measured. This is referred to as your fasting blood glucose, since you fasted before the test. Then, you will drink a glucose solution that contains a certain amount of glucose. Your blood glucose will be measured again 1, 2, and 3 hours after drinking the solution. This test takes about 3 hours to complete. You will need to stay at the testing location during this time. During the testing period:  Do not eat or drink anything other than the glucose solution.  Do not exercise.  Do not use any products that contain nicotine or tobacco, such as cigarettes and e-cigarettes. If you need help stopping, ask your health care provider. The testing procedure may vary among health care providers and hospitals. How are the results reported? Your results will be reported as milligrams of glucose per deciliter of blood (mg/dL) or millimoles per liter (mmol/L). Your health care provider will compare your results to normal ranges that were  established after testing a large group of people (reference ranges). Reference ranges may vary among labs and hospitals. For this test, common reference ranges are:  Fasting: less than 95-105 mg/dL (1.6-1.0 mmol/L).  1 hour after drinking glucose: less than 180-190 mg/dL (96.0-45.4 mmol/L).  2 hours after drinking glucose: less than  155-165 mg/dL (0.9-8.1 mmol/L).  3 hours after drinking glucose: 140-145 mg/dL (1.9-1.4 mmol/L). What do the results mean? Results within reference ranges are considered normal, meaning that your glucose levels are well-controlled. If two or more of your blood glucose levels are high, you may be diagnosed with gestational diabetes. If only one level is high, your health care provider may suggest repeat testing or other tests to confirm a diagnosis. Talk with your health care provider about what your results mean. Questions to ask your health care provider Ask your health care provider, or the department that is doing the test:  When will my results be ready?  How will I get my results?  What are my treatment options?  What other tests do I need?  What are my next steps? Summary  The glucose tolerance test (GTT) is one of several tests used to diagnose diabetes that develops during pregnancy (gestational diabetes mellitus). Gestational diabetes is a temporary form of diabetes that some women develop during pregnancy.  You may have the GTT test after having a 1-hour glucose screening test if the results from that test indicate that you may have gestational diabetes. You may also have this test if you have any symptoms or risk factors for gestational diabetes.  Talk with your health care provider about what your results mean. This information is not intended to replace advice given to you by your health care provider. Make sure you discuss any questions you have with your health care provider. Document Revised: 07/25/2018 Document Reviewed: 11/13/2016 Elsevier Patient Education  2020 [employer].

## 2020-03-25 NOTE — Progress Notes (Signed)
   MY CHART VIDEO VIRTUAL OBSTETRICS VISIT ENCOUNTER NOTE  I connected with Brianna Lee on 03/25/20 at 10:50 AM EST by My Chart video at home and verified that I am speaking with the correct person using two identifiers. Provider located at [employer] for [employer] at [place].   I discussed the limitations, risks, security and privacy concerns of performing an evaluation and management service by My Chart video and the availability of in person appointments. I also discussed with the patient that there may be a patient responsible charge related to this service. The patient expressed understanding and agreed to proceed.  Subjective:  Brianna Lee is a 29 y.o. G2P0010 at [redacted]w[redacted]d being followed for ongoing prenatal care.  She is currently monitored for the following issues for this low-risk pregnancy and has Supervision of other normal pregnancy, antepartum; Trichomonal vaginitis during pregnancy in first trimester; Obesity affecting pregnancy, antepartum; and Folliculitis on their problem list.  Patient reports difficulty sleeping because unable to get comfortable. Reports fetal movement. Denies any contractions, bleeding or leaking of fluid.   The following portions of the patient's history were reviewed and updated as appropriate: allergies, current medications, past family history, past medical history, past social history, past surgical history and problem list.   Objective:   General:  Alert, oriented and cooperative.   Mental Status: Normal mood and affect perceived. Normal judgment and thought content.  Rest of physical exam deferred due to type of encounter  BP 127/60   Pulse 92   Wt 266 lb (120.7 kg)   LMP 09/23/2019   BMI 45.66 kg/m  **Done by patient's own at home BP cuff and scale  Assessment and Plan:  Pregnancy: G2P0010 at [redacted]w[redacted]d  1. Supervision of other normal pregnancy, antepartum  - Advised sleeping in a recliner chair to help sleep - Anticipatory guidance for 2 hr  GTT - advised to fast after midnight without anything to eat or drink (except for water), will have fasting blood drawn, drink the glucola drink (flavor choices: orange, fruit punch or lemon-lime), have a visit with a provider during the first hour of testing, wait in the lab waiting room to have blood drawn at 1 hour and then 2 hours after finishing glucola drink.  2. [redacted] weeks gestation of pregnancy    Preterm labor symptoms and general obstetric precautions including but not limited to vaginal bleeding, contractions, leaking of fluid and fetal movement were reviewed in detail with the patient.  I discussed the assessment and treatment plan with the patient. The patient was provided an opportunity to ask questions and all were answered. The patient agreed with the plan and demonstrated an understanding of the instructions. The patient was advised to call back or seek an in-person office evaluation/go to MAU at [facility] for any urgent or concerning symptoms. Please refer to After Visit Summary for other counseling recommendations.   I provided 5 minutes of non-face-to-face time during this encounter. There was 5 minutes of chart review time spent prior to this encounter. Total time spent = 10 minutes.  Return in about 5 weeks (around 04/29/2020) for Return OB 2hr GTT.  Future Appointments  Date Time Provider Department Center  03/25/2020 10:50 AM Raelyn Mora, CNM [facility] None  03/26/2020 10:30 AM [facility] NURSE [address]  03/26/2020 10:45 AM [facility] US4 [facility]    Karrington Studnicka Arita Miss, CNM Center for [employer], [facility]

## 2020-03-26 ENCOUNTER — Ambulatory Visit: Payer: Medicaid Other | Admitting: *Deleted

## 2020-03-26 ENCOUNTER — Other Ambulatory Visit: Payer: Self-pay | Admitting: *Deleted

## 2020-03-26 ENCOUNTER — Ambulatory Visit: Payer: Medicaid Other | Attending: Obstetrics and Gynecology

## 2020-03-26 ENCOUNTER — Other Ambulatory Visit: Payer: Self-pay

## 2020-03-26 ENCOUNTER — Encounter: Payer: Self-pay | Admitting: *Deleted

## 2020-03-26 DIAGNOSIS — O99212 Obesity complicating pregnancy, second trimester: Secondary | ICD-10-CM

## 2020-03-26 DIAGNOSIS — A5901 Trichomonal vulvovaginitis: Secondary | ICD-10-CM | POA: Diagnosis present

## 2020-03-26 DIAGNOSIS — Z3480 Encounter for supervision of other normal pregnancy, unspecified trimester: Secondary | ICD-10-CM | POA: Diagnosis present

## 2020-03-26 DIAGNOSIS — Z3A23 23 weeks gestation of pregnancy: Secondary | ICD-10-CM | POA: Diagnosis not present

## 2020-03-26 DIAGNOSIS — Z148 Genetic carrier of other disease: Secondary | ICD-10-CM | POA: Diagnosis not present

## 2020-03-26 DIAGNOSIS — Z362 Encounter for other antenatal screening follow-up: Secondary | ICD-10-CM

## 2020-03-26 DIAGNOSIS — O23591 Infection of other part of genital tract in pregnancy, first trimester: Secondary | ICD-10-CM | POA: Insufficient documentation

## 2020-04-26 ENCOUNTER — Other Ambulatory Visit (HOSPITAL_COMMUNITY): Payer: Self-pay | Admitting: Obstetrics

## 2020-04-26 ENCOUNTER — Ambulatory Visit: Payer: Medicaid Other | Admitting: *Deleted

## 2020-04-26 ENCOUNTER — Encounter: Payer: Self-pay | Admitting: *Deleted

## 2020-04-26 ENCOUNTER — Other Ambulatory Visit: Payer: Self-pay

## 2020-04-26 ENCOUNTER — Other Ambulatory Visit: Payer: Self-pay | Admitting: *Deleted

## 2020-04-26 ENCOUNTER — Ambulatory Visit: Payer: Medicaid Other | Attending: Obstetrics and Gynecology

## 2020-04-26 DIAGNOSIS — Z3A27 27 weeks gestation of pregnancy: Secondary | ICD-10-CM | POA: Diagnosis not present

## 2020-04-26 DIAGNOSIS — O23591 Infection of other part of genital tract in pregnancy, first trimester: Secondary | ICD-10-CM | POA: Diagnosis present

## 2020-04-26 DIAGNOSIS — Z362 Encounter for other antenatal screening follow-up: Secondary | ICD-10-CM

## 2020-04-26 DIAGNOSIS — Z3480 Encounter for supervision of other normal pregnancy, unspecified trimester: Secondary | ICD-10-CM

## 2020-04-26 DIAGNOSIS — A5901 Trichomonal vulvovaginitis: Secondary | ICD-10-CM | POA: Insufficient documentation

## 2020-04-26 DIAGNOSIS — Z148 Genetic carrier of other disease: Secondary | ICD-10-CM | POA: Diagnosis not present

## 2020-04-26 DIAGNOSIS — O365930 Maternal care for other known or suspected poor fetal growth, third trimester, not applicable or unspecified: Secondary | ICD-10-CM | POA: Insufficient documentation

## 2020-04-26 DIAGNOSIS — O99212 Obesity complicating pregnancy, second trimester: Secondary | ICD-10-CM

## 2020-04-26 DIAGNOSIS — O365920 Maternal care for other known or suspected poor fetal growth, second trimester, not applicable or unspecified: Secondary | ICD-10-CM

## 2020-04-28 DIAGNOSIS — U071 COVID-19: Secondary | ICD-10-CM | POA: Diagnosis not present

## 2020-04-29 ENCOUNTER — Encounter: Payer: Medicaid Other | Admitting: Obstetrics and Gynecology

## 2020-04-29 DIAGNOSIS — U071 COVID-19: Secondary | ICD-10-CM

## 2020-04-29 DIAGNOSIS — O98512 Other viral diseases complicating pregnancy, second trimester: Secondary | ICD-10-CM

## 2020-04-29 HISTORY — DX: Other viral diseases complicating pregnancy, second trimester: O98.512

## 2020-04-29 HISTORY — DX: COVID-19: U07.1

## 2020-05-06 ENCOUNTER — Other Ambulatory Visit: Payer: Self-pay

## 2020-05-06 ENCOUNTER — Ambulatory Visit (INDEPENDENT_AMBULATORY_CARE_PROVIDER_SITE_OTHER): Payer: Medicaid Other | Admitting: Obstetrics and Gynecology

## 2020-05-06 ENCOUNTER — Encounter: Payer: Self-pay | Admitting: Obstetrics and Gynecology

## 2020-05-06 VITALS — BP 137/93 | HR 114 | Temp 98.1°F | Wt 282.0 lb

## 2020-05-06 DIAGNOSIS — Z3A28 28 weeks gestation of pregnancy: Secondary | ICD-10-CM

## 2020-05-06 DIAGNOSIS — Z3480 Encounter for supervision of other normal pregnancy, unspecified trimester: Secondary | ICD-10-CM

## 2020-05-06 DIAGNOSIS — O163 Unspecified maternal hypertension, third trimester: Secondary | ICD-10-CM | POA: Diagnosis not present

## 2020-05-06 DIAGNOSIS — O365990 Maternal care for other known or suspected poor fetal growth, unspecified trimester, not applicable or unspecified: Secondary | ICD-10-CM

## 2020-05-06 DIAGNOSIS — O26843 Uterine size-date discrepancy, third trimester: Secondary | ICD-10-CM

## 2020-05-06 NOTE — Patient Instructions (Signed)
Third Trimester of Pregnancy  The third trimester of pregnancy is from week 28 through week 40. This is months 7 through 9. The third trimester is a time when the unborn baby (fetus) is growing rapidly. At the end of the ninth month, the fetus is about 20 inches long and weighs 6-10 pounds. Body changes during your third trimester During the third trimester, your body will continue to go through many changes. The changes vary and generally return to normal after your baby is born. Physical changes  Your weight will continue to increase. You can expect to gain 25-35 pounds (11-16 kg) by the end of the pregnancy if you begin pregnancy at a normal weight. If you are underweight, you can expect to gain 28-40 lb (about 13-18 kg), and if you are overweight, you can expect to gain 15-25 lb (about 7-11 kg).  You may begin to get stretch marks on your hips, abdomen, and breasts.  Your breasts will continue to grow and may hurt. A yellow fluid (colostrum) may leak from your breasts. This is the first milk you are producing for your baby.  You may have changes in your hair. These can include thickening of your hair, rapid growth, and changes in texture. Some people also have hair loss during or after pregnancy, or hair that feels dry or thin.  Your belly button may stick out.  You may notice more swelling in your hands, face, or ankles. Health changes  You may have heartburn.  You may have constipation.  You may develop hemorrhoids.  You may develop swollen, bulging veins (varicose veins) in your legs.  You may have increased body aches in the pelvis, back, or thighs. This is due to weight gain and increased hormones that are relaxing your joints.  You may have increased tingling or numbness in your hands, arms, and legs. The skin on your abdomen may also feel numb.  You may feel short of breath because of your expanding uterus. Other changes  You may urinate more often because the fetus is  moving lower into your pelvis and pressing on your bladder.  You may have more problems sleeping. This may be caused by the size of your abdomen, an increased need to urinate, and an increase in your body's metabolism.  You may notice the fetus "dropping," or moving lower in your abdomen (lightening).  You may have increased vaginal discharge.  You may notice that you have pain around your pelvic bone as your uterus distends. Follow these instructions at home: Medicines  Follow your health care provider's instructions regarding medicine use. Specific medicines may be either safe or unsafe to take during pregnancy. Do not take any medicines unless approved by your health care provider.  Take a prenatal vitamin that contains at least 600 micrograms (mcg) of folic acid. Eating and drinking  Eat a healthy diet that includes fresh fruits and vegetables, whole grains, good sources of protein such as meat, eggs, or tofu, and low-fat dairy products.  Avoid raw meat and unpasteurized juice, milk, and cheese. These carry germs that can harm you and your baby.  Eat 4 or 5 small meals rather than 3 large meals a day.  You may need to take these actions to prevent or treat constipation: ? Drink enough fluid to keep your urine pale yellow. ? Eat foods that are high in fiber, such as beans, whole grains, and fresh fruits and vegetables. ? Limit foods that are high in fat and processed sugars,  such as fried or sweet foods. Activity  Exercise only as directed by your health care provider. Most people can continue their usual exercise routine during pregnancy. Try to exercise for 30 minutes at least 5 days a week. Stop exercising if you experience contractions in the uterus.  Stop exercising if you develop pain or cramping in the lower abdomen or lower back.  Avoid heavy lifting.  Do not exercise if it is very hot or humid or if you are at a high altitude.  If you choose to, you may continue to  have sex unless your health care provider tells you not to. Relieving pain and discomfort  Take frequent breaks and rest with your legs raised (elevated) if you have leg cramps or low back pain.  Take warm sitz baths to soothe any pain or discomfort caused by hemorrhoids. Use hemorrhoid cream if your health care provider approves.  Wear a supportive bra to prevent discomfort from breast tenderness.  If you develop varicose veins: ? Wear support hose as told by your health care provider. ? Elevate your feet for 15 minutes, 3-4 times a day. ? Limit salt in your diet. Safety  Talk to your health care provider before traveling far distances.  Do not use hot tubs, steam rooms, or saunas.  Wear your seat belt at all times when driving or riding in a car.  Talk with your health care provider if someone is verbally or physically abusive to you. Preparing for birth To prepare for the arrival of your baby:  Take prenatal classes to understand, practice, and ask questions about labor and delivery.  Visit the hospital and tour the maternity area.  Purchase a rear-facing car seat and make sure you know how to install it in your car.  Prepare the baby's room or sleeping area. Make sure to remove all pillows and stuffed animals from the baby's crib to prevent suffocation. General instructions  Avoid cat litter boxes and soil used by cats. These carry germs that can cause birth defects in the baby. If you have a cat, ask someone to clean the litter box for you.  Do not douche or use tampons. Do not use scented sanitary pads.  Do not use any products that contain nicotine or tobacco, such as cigarettes, e-cigarettes, and chewing tobacco. If you need help quitting, ask your health care provider.  Do not use any herbal remedies, illegal drugs, or medicines that were not prescribed to you. Chemicals in these products can harm your baby.  Do not drink alcohol.  You will have more frequent  prenatal exams during the third trimester. During a routine prenatal visit, your health care provider will do a physical exam, perform tests, and discuss your overall health. Keep all follow-up visits. This is important. Where to find more information  American Pregnancy Association: americanpregnancy.org  American College of Obstetricians and Gynecologists: acog.org/en/Womens%20Health/Pregnancy  Office on Women's Health: womenshealth.gov/pregnancy Contact a health care provider if you have:  A fever.  Mild pelvic cramps, pelvic pressure, or nagging pain in your abdominal area or lower back.  Vomiting or diarrhea.  Bad-smelling vaginal discharge or foul-smelling urine.  Pain when you urinate.  A headache that does not go away when you take medicine.  Visual changes or see spots in front of your eyes. Get help right away if:  Your water breaks.  You have regular contractions less than 5 minutes apart.  You have spotting or bleeding from your vagina.  You have severe   abdominal pain.  You have difficulty breathing.  You have chest pain.  You have fainting spells.  You have not felt your baby move for the time period told by your health care provider.  You have new or increased pain, swelling, or redness in an arm or leg. Summary  The third trimester of pregnancy is from week 28 through week 40 (months 7 through 9).  You may have more problems sleeping. This can be caused by the size of your abdomen, an increased need to urinate, and an increase in your body's metabolism.  You will have more frequent prenatal exams during the third trimester. Keep all follow-up visits. This is important. This information is not intended to replace advice given to you by your health care provider. Make sure you discuss any questions you have with your health care provider. Document Revised: 09/10/2019 Document Reviewed: 07/17/2019 Elsevier Patient Education  2021 [employer] Fetal  Movement Counts Patient Name: ________________________________________________ Patient Due Date: ____________________  What is a fetal movement count? A fetal movement count is the number of times that you feel your baby move during a certain amount of time. This may also be called a fetal kick count. A fetal movement count is recommended for every pregnant woman. You may be asked to start counting fetal movements as early as week 28 of your pregnancy. Pay attention to when your baby is most active. You may notice your baby's sleep and wake cycles. You may also notice things that make your baby move more. You should do a fetal movement count:  When your baby is normally most active.  At the same time each day. A good time to count movements is while you are resting, after having something to eat and drink. How do I count fetal movements? 1. Find a quiet, comfortable area. Sit, or lie down on your side. 2. Write down the date, the start time and stop time, and the number of movements that you felt between those two times. Take this information with you to your health care visits. 3. Write down your start time when you feel the first movement. 4. Count kicks, flutters, swishes, rolls, and jabs. You should feel at least 10 movements. 5. You may stop counting after you have felt 10 movements, or if you have been counting for 2 hours. Write down the stop time. 6. If you do not feel 10 movements in 2 hours, contact your health care provider for further instructions. Your health care provider may want to do additional tests to assess your baby's well-being. Contact a health care provider if:  You feel fewer than 10 movements in 2 hours.  Your baby is not moving like he or she usually does. Date: ____________ Start time: ____________ Stop time: ____________ Movements: ____________ Date: ____________ Start time: ____________ Stop time: ____________ Movements: ____________ Date: ____________ Start  time: ____________ Stop time: ____________ Movements: ____________ Date: ____________ Start time: ____________ Stop time: ____________ Movements: ____________ Date: ____________ Start time: ____________ Stop time: ____________ Movements: ____________ Date: ____________ Start time: ____________ Stop time: ____________ Movements: ____________ Date: ____________ Start time: ____________ Stop time: ____________ Movements: ____________ Date: ____________ Start time: ____________ Stop time: ____________ Movements: ____________ Date: ____________ Start time: ____________ Stop time: ____________ Movements: ____________ This information is not intended to replace advice given to you by your health care provider. Make sure you discuss any questions you have with your health care provider. Document Revised: 11/21/2018 Document Reviewed: 11/21/2018 Elsevier Patient Education  2021 [employer]  Inc. Iron-Rich Diet  Iron is a mineral that helps your body to produce hemoglobin. Hemoglobin is a protein in red blood cells that carries oxygen to your body's tissues. Eating too little iron may cause you to feel weak and tired, and it can increase your risk of infection. Iron is naturally found in many foods, and many foods have iron added to them (iron-fortified foods). You may need to follow an iron-rich diet if you do not have enough iron in your body due to certain medical conditions. The amount of iron that you need each day depends on your age, your sex, and any medical conditions you have. Follow instructions from your health care provider or a diet and nutrition specialist (dietitian) about how much iron you should eat each day. What are tips for following this plan? Reading food labels  Check food labels to see how many milligrams (mg) of iron are in each serving. Cooking  Cook foods in pots and pans that are made from iron.  Take these steps to make it easier for your body to absorb iron from certain  foods: ? Soak beans overnight before cooking. ? Soak whole grains overnight and drain them before using. ? Ferment flours before baking, such as by using yeast in bread dough. Meal planning  When you eat foods that contain iron, you should eat them with foods that are high in vitamin C. These include oranges, peppers, tomatoes, potatoes, and mango. Vitamin C helps your body to absorb iron. General information  Take iron supplements only as told by your health care provider. An overdose of iron can be life-threatening. If you were prescribed iron supplements, take them with orange juice or a vitamin C supplement.  When you eat iron-fortified foods or take an iron supplement, you should also eat foods that naturally contain iron, such as meat, poultry, and fish. Eating naturally iron-rich foods helps your body to absorb the iron that is added to other foods or contained in a supplement.  Certain foods and drinks prevent your body from absorbing iron properly. Avoid eating these foods in the same meal as iron-rich foods or with iron supplements. These foods include: ? Coffee, black tea, and red wine. ? Milk, dairy products, and foods that are high in calcium. ? Beans and soybeans. ? Whole grains. What foods should I eat? Fruits Prunes. Raisins. Eat fruits high in vitamin C, such as oranges, grapefruits, and strawberries, alongside iron-rich foods. Vegetables Spinach (cooked). Green peas. Broccoli. Fermented vegetables. Eat vegetables high in vitamin C, such as leafy greens, potatoes, bell peppers, and tomatoes, alongside iron-rich foods. Grains Iron-fortified breakfast cereal. Iron-fortified whole-wheat bread. Enriched rice. Sprouted grains. Meats and other proteins Beef liver. Oysters. Beef. Shrimp. [place]. Chicken. Tuna. Sardines. Chickpeas. Nuts. Tofu. Pumpkin seeds. Beverages Tomato juice. Fresh orange juice. Prune juice. Hibiscus tea. Fortified instant breakfast shakes. Sweets and  desserts Blackstrap molasses. Seasonings and condiments Tahini. Fermented soy sauce. Other foods Wheat germ. The items listed above may not be a complete list of recommended foods and beverages. Contact a dietitian for more information. What foods should I avoid? Grains Whole grains. Bran cereal. Bran flour. Oats. Meats and other proteins Soybeans. Products made from soy protein. Black beans. Lentils. Mung beans. Split peas. Dairy Milk. Cream. Cheese. Yogurt. Cottage cheese. Beverages Coffee. Black tea. Red wine. Sweets and desserts Cocoa. Chocolate. Ice cream. Other foods Basil. Oregano. Large amounts of parsley. The items listed above may not be a complete list of foods and beverages  to avoid. Contact a dietitian for more information. Summary  Iron is a mineral that helps your body to produce hemoglobin. Hemoglobin is a protein in red blood cells that carries oxygen to your body's tissues.  Iron is naturally found in many foods, and many foods have iron added to them (iron-fortified foods).  When you eat foods that contain iron, you should eat them with foods that are high in vitamin C. Vitamin C helps your body to absorb iron.  Certain foods and drinks prevent your body from absorbing iron properly, such as whole grains and dairy products. You should avoid eating these foods in the same meal as iron-rich foods or with iron supplements. This information is not intended to replace advice given to you by your health care provider. Make sure you discuss any questions you have with your health care provider. Document Revised: 03/16/2017 Document Reviewed: 02/27/2017 Elsevier Patient Education  2021 [employer].

## 2020-05-06 NOTE — Progress Notes (Signed)
   LOW-RISK PREGNANCY OFFICE VISIT Patient name: Brianna Lee MRN [id number]  Date of birth: 1991/03/24 Chief Complaint:   Routine Prenatal Visit  History of Present Illness:   Brianna Lee is a 30 y.o. G2P0010 female at 71w6dwith an Estimated Date of Delivery: 07/23/20 being seen today for ongoing management of a low-risk pregnancy.  Today she reports no complaints. She reports drinking Boosts shakes since MFM told her the baby's growth had slowed down and was restricted. Contractions: Not present. Vag. Bleeding: None.  Movement: Present. denies leaking of fluid. Review of Systems:   Pertinent items are noted in HPI Denies abnormal vaginal discharge w/ itching/odor/irritation, headaches, visual changes, shortness of breath, chest pain, abdominal pain, severe nausea/vomiting, or problems with urination or bowel movements unless otherwise stated above. Pertinent History Reviewed:  Reviewed past medical,surgical, social, obstetrical and family history.  Reviewed problem list, medications and allergies. Physical Assessment:   Vitals:   05/06/20 0813  BP: (!) 137/93  Pulse: (!) 114  Temp: 98.1 F (36.7 C)  Weight: 282 lb (127.9 kg)  Body mass index is 48.41 kg/m.        Physical Examination:   General appearance: Well appearing, and in no distress  Mental status: Alert, oriented to person, place, and time  Skin: Warm & dry  Cardiovascular: Normal heart rate noted  Respiratory: Normal respiratory effort, no distress  Abdomen: Soft, gravid, nontender  Pelvic: Cervical exam deferred         Extremities: Edema: None  Fetal Status: Fetal Heart Rate (bpm): 147 Fundal Height: 37 cm Movement: Present    No results found for this or any previous visit (from the past 24 hour(s)).  Assessment & Plan:  1) Low-risk pregnancy G2P0010 at 252w6dith an Estimated Date of Delivery: 07/23/20   2) Supervision of other normal pregnancy, antepartum  - Glucose Tolerance, 2 Hours w/1 Hour,  - HIV Antibody  (routine testing w rflx),  - RPR,  - CBC  3) Elevated blood pressure affecting pregnancy in third trimester, antepartum  - CBC,  - Comp Met (CMET),  - Protein / creatinine ratio, urine  4) Uterine size date discrepancy pregnancy, third trimester - Discussed FH measurements are measuring larger d/t obesity - U/S scheduled for tomorrow  5) Pregnancy affected by fetal growth restriction - Weekly U/S -- next on tomorrow  6) [redacted] weeks gestation of pregnancy    Meds: No orders of the defined types were placed in this encounter.  Labs/procedures today: 2 hr GTT, 3rd trimester labs, PEC labs  Plan:  Continue routine obstetrical care   Reviewed: Preterm labor symptoms and general obstetric precautions including but not limited to vaginal bleeding, contractions, leaking of fluid and fetal movement were reviewed in detail with the patient.  All questions were answered. Has home bp cuff. Check bp weekly, let usKoreanow if >140/90.   Follow-up: Return in about 4 weeks (around 06/03/2020) for Return OB - My Chart video.  Orders Placed This Encounter  Procedures  . Glucose Tolerance, 2 Hours w/1 Hour  . HIV Antibody (routine testing w rflx)  . RPR  . CBC  . Comp Met (CMET)  . Protein / creatinine ratio, urine   RoLaury DeepSN, CNM 05/06/2020 8:22 AM

## 2020-05-07 ENCOUNTER — Ambulatory Visit: Payer: Medicaid Other | Attending: Obstetrics and Gynecology

## 2020-05-07 ENCOUNTER — Ambulatory Visit (HOSPITAL_BASED_OUTPATIENT_CLINIC_OR_DEPARTMENT_OTHER): Payer: Medicaid Other | Admitting: *Deleted

## 2020-05-07 ENCOUNTER — Ambulatory Visit: Payer: Medicaid Other | Admitting: *Deleted

## 2020-05-07 ENCOUNTER — Encounter: Payer: Self-pay | Admitting: *Deleted

## 2020-05-07 ENCOUNTER — Other Ambulatory Visit (HOSPITAL_COMMUNITY): Payer: Self-pay | Admitting: Obstetrics

## 2020-05-07 ENCOUNTER — Ambulatory Visit (INDEPENDENT_AMBULATORY_CARE_PROVIDER_SITE_OTHER): Payer: Medicaid Other | Admitting: *Deleted

## 2020-05-07 ENCOUNTER — Other Ambulatory Visit: Payer: Self-pay

## 2020-05-07 DIAGNOSIS — Z3480 Encounter for supervision of other normal pregnancy, unspecified trimester: Secondary | ICD-10-CM

## 2020-05-07 DIAGNOSIS — Z3A29 29 weeks gestation of pregnancy: Secondary | ICD-10-CM | POA: Insufficient documentation

## 2020-05-07 DIAGNOSIS — O365930 Maternal care for other known or suspected poor fetal growth, third trimester, not applicable or unspecified: Secondary | ICD-10-CM | POA: Diagnosis not present

## 2020-05-07 DIAGNOSIS — O365931 Maternal care for other known or suspected poor fetal growth, third trimester, fetus 1: Secondary | ICD-10-CM

## 2020-05-07 DIAGNOSIS — O365920 Maternal care for other known or suspected poor fetal growth, second trimester, not applicable or unspecified: Secondary | ICD-10-CM | POA: Insufficient documentation

## 2020-05-07 DIAGNOSIS — O365990 Maternal care for other known or suspected poor fetal growth, unspecified trimester, not applicable or unspecified: Secondary | ICD-10-CM | POA: Diagnosis not present

## 2020-05-07 DIAGNOSIS — O99210 Obesity complicating pregnancy, unspecified trimester: Secondary | ICD-10-CM | POA: Diagnosis not present

## 2020-05-07 DIAGNOSIS — A5901 Trichomonal vulvovaginitis: Secondary | ICD-10-CM | POA: Diagnosis present

## 2020-05-07 DIAGNOSIS — O23591 Infection of other part of genital tract in pregnancy, first trimester: Secondary | ICD-10-CM

## 2020-05-07 DIAGNOSIS — O99213 Obesity complicating pregnancy, third trimester: Secondary | ICD-10-CM

## 2020-05-07 DIAGNOSIS — Z148 Genetic carrier of other disease: Secondary | ICD-10-CM | POA: Diagnosis not present

## 2020-05-07 DIAGNOSIS — E669 Obesity, unspecified: Secondary | ICD-10-CM

## 2020-05-07 LAB — COMPREHENSIVE METABOLIC PANEL
ALT: 26 IU/L (ref 0–32)
AST: 20 IU/L (ref 0–40)
Albumin/Globulin Ratio: 0.9 — ABNORMAL LOW (ref 1.2–2.2)
Albumin: 2.9 g/dL — ABNORMAL LOW (ref 3.9–5.0)
Alkaline Phosphatase: 142 IU/L — ABNORMAL HIGH (ref 44–121)
BUN/Creatinine Ratio: 12 (ref 9–23)
BUN: 8 mg/dL (ref 6–20)
Bilirubin Total: <0.2 mg/dL (ref 0.0–1.2)
CO2: 19 mmol/L — ABNORMAL LOW (ref 20–29)
Calcium: 9.2 mg/dL (ref 8.7–10.2)
Chloride: 101 mmol/L (ref 96–106)
Creatinine, Ser: 0.65 mg/dL (ref 0.57–1.00)
GFR calc Af Amer: 139 mL/min/{1.73_m2} (ref >59)
GFR calc non Af Amer: 120 mL/min/{1.73_m2} (ref >59)
Globulin, Total: 3.2 g/dL (ref 1.5–4.5)
Glucose: 131 mg/dL — ABNORMAL HIGH (ref 65–99)
Potassium: 4.1 mmol/L (ref 3.5–5.2)
Sodium: 134 mmol/L (ref 134–144)
Total Protein: 6.1 g/dL (ref 6.0–8.5)

## 2020-05-07 LAB — CBC
Hematocrit: 41.7 % (ref 34.0–46.6)
Hemoglobin: 13.6 g/dL (ref 11.1–15.9)
MCH: 27.5 pg (ref 26.6–33.0)
MCHC: 32.6 g/dL (ref 31.5–35.7)
MCV: 84 fL (ref 79–97)
Platelets: 369 10*3/uL (ref 150–450)
RBC: 4.94 x10E6/uL (ref 3.77–5.28)
RDW: 13.3 % (ref 11.7–15.4)
WBC: 12.2 10*3/uL — ABNORMAL HIGH (ref 3.4–10.8)

## 2020-05-07 LAB — PROTEIN / CREATININE RATIO, URINE
Creatinine, Urine: 138.4 mg/dL
Protein, Ur: 22.9 mg/dL
Protein/Creat Ratio: 165 mg/g creat (ref 0–200)

## 2020-05-07 LAB — RPR: RPR Ser Ql: NONREACTIVE

## 2020-05-07 LAB — GLUCOSE TOLERANCE, 2 HOURS W/ 1HR
Glucose, 1 hour: 135 mg/dL (ref 65–179)
Glucose, 2 hour: 119 mg/dL (ref 65–152)
Glucose, Fasting: 72 mg/dL (ref 65–91)

## 2020-05-07 LAB — HIV ANTIBODY (ROUTINE TESTING W REFLEX): HIV Screen 4th Generation wRfx: NONREACTIVE

## 2020-05-07 NOTE — Progress Notes (Signed)
Subjective:  Brianna Lee is a 30 y.o. female here for BP check.   Hypertension ROS: taking medications as instructed, no medication side effects noted, no TIA's, no chest pain on exertion, no dyspnea on exertion, no swelling of ankles, no orthostatic dizziness or lightheadedness, no orthopnea or paroxysmal nocturnal dyspnea and no palpitations.    Objective:  BP 127/89 (BP Location: Left Arm, Patient Position: Sitting, Cuff Size: Large)   Pulse 91   Temp 98 F (36.7 C) (Oral)   Wt 282 lb (127.9 kg)   LMP 09/23/2019   BMI 48.41 kg/m   Appearance alert, well appearing, and in no distress, oriented to person, place, and time, normal appearing weight and overweight. General exam BP noted to be well controlled today in office.    Assessment:   Blood Pressure stable and asymptomatic.   Plan:  Current treatment plan is effective, no change in therapy.   Clovis Pu, RN

## 2020-05-07 NOTE — Procedures (Signed)
Brianna Lee 04-30-1990 [redacted]w[redacted]d  Fetus A Non-Stress Test Interpretation for 05/07/20  Indication: IUGR  Fetal Heart Rate A Mode: External Baseline Rate (A): 140 bpm Variability: Moderate Accelerations: 10 x 10 Decelerations: None Multiple birth?: No  Uterine Activity Mode: Palpation,Toco Contraction Frequency (min): none noted Resting Tone Palpated: Relaxed Resting Time: Adequate  Interpretation (Fetal Testing) Nonstress Test Interpretation: Reactive Overall Impression: Reassuring for gestational age Comments: Reviewed tracing with Dr. Parke Poisson

## 2020-05-10 ENCOUNTER — Other Ambulatory Visit: Payer: Self-pay

## 2020-05-10 ENCOUNTER — Ambulatory Visit: Payer: Medicaid Other | Admitting: *Deleted

## 2020-05-10 ENCOUNTER — Ambulatory Visit: Payer: Medicaid Other | Attending: Obstetrics and Gynecology

## 2020-05-10 ENCOUNTER — Encounter: Payer: Self-pay | Admitting: *Deleted

## 2020-05-10 ENCOUNTER — Ambulatory Visit (HOSPITAL_BASED_OUTPATIENT_CLINIC_OR_DEPARTMENT_OTHER): Payer: Medicaid Other | Admitting: *Deleted

## 2020-05-10 VITALS — BP 120/79 | HR 107

## 2020-05-10 DIAGNOSIS — O365930 Maternal care for other known or suspected poor fetal growth, third trimester, not applicable or unspecified: Secondary | ICD-10-CM | POA: Insufficient documentation

## 2020-05-10 DIAGNOSIS — A5901 Trichomonal vulvovaginitis: Secondary | ICD-10-CM

## 2020-05-10 DIAGNOSIS — Z3480 Encounter for supervision of other normal pregnancy, unspecified trimester: Secondary | ICD-10-CM

## 2020-05-10 DIAGNOSIS — E669 Obesity, unspecified: Secondary | ICD-10-CM

## 2020-05-10 DIAGNOSIS — Z148 Genetic carrier of other disease: Secondary | ICD-10-CM | POA: Diagnosis not present

## 2020-05-10 DIAGNOSIS — O99213 Obesity complicating pregnancy, third trimester: Secondary | ICD-10-CM | POA: Diagnosis not present

## 2020-05-10 DIAGNOSIS — O23591 Infection of other part of genital tract in pregnancy, first trimester: Secondary | ICD-10-CM | POA: Diagnosis present

## 2020-05-10 DIAGNOSIS — Z3A29 29 weeks gestation of pregnancy: Secondary | ICD-10-CM

## 2020-05-10 DIAGNOSIS — O365931 Maternal care for other known or suspected poor fetal growth, third trimester, fetus 1: Secondary | ICD-10-CM | POA: Insufficient documentation

## 2020-05-10 DIAGNOSIS — O365920 Maternal care for other known or suspected poor fetal growth, second trimester, not applicable or unspecified: Secondary | ICD-10-CM | POA: Diagnosis not present

## 2020-05-10 NOTE — Procedures (Signed)
Cailie Bosshart Oct 30, 1990 [redacted]w[redacted]d  Fetus A Non-Stress Test Interpretation for 05/10/20  Indication: IUGR  Fetal Heart Rate A Mode: External Baseline Rate (A): 140 bpm Variability: Moderate Accelerations: 10 x 10 Decelerations: None Multiple birth?: No  Uterine Activity Mode: Palpation,Toco Contraction Frequency (min): none Resting Tone Palpated: Relaxed Resting Time: Adequate  Interpretation (Fetal Testing) Nonstress Test Interpretation: Reactive Overall Impression: Reassuring for gestational age Comments: Dr. Parke Poisson reviewed tracing.

## 2020-05-13 ENCOUNTER — Encounter (HOSPITAL_COMMUNITY): Payer: Self-pay | Admitting: Obstetrics and Gynecology

## 2020-05-13 ENCOUNTER — Encounter: Payer: Self-pay | Admitting: Obstetrics and Gynecology

## 2020-05-13 ENCOUNTER — Observation Stay (HOSPITAL_COMMUNITY)
Admission: AD | Admit: 2020-05-13 | Discharge: 2020-05-14 | Disposition: A | Discharge status: Home / Self Care | Payer: Medicaid Other | Attending: Obstetrics & Gynecology | Admitting: Obstetrics & Gynecology

## 2020-05-13 ENCOUNTER — Other Ambulatory Visit: Payer: Self-pay

## 2020-05-13 DIAGNOSIS — Y92002 Bathroom of unspecified non-institutional (private) residence single-family (private) house as the place of occurrence of the external cause: Secondary | ICD-10-CM | POA: Diagnosis not present

## 2020-05-13 DIAGNOSIS — Z3A29 29 weeks gestation of pregnancy: Secondary | ICD-10-CM

## 2020-05-13 DIAGNOSIS — O368131 Decreased fetal movements, third trimester, fetus 1: Secondary | ICD-10-CM | POA: Diagnosis not present

## 2020-05-13 DIAGNOSIS — Z7982 Long term (current) use of aspirin: Secondary | ICD-10-CM | POA: Insufficient documentation

## 2020-05-13 DIAGNOSIS — Y93E1 Activity, personal bathing and showering: Secondary | ICD-10-CM | POA: Diagnosis not present

## 2020-05-13 DIAGNOSIS — Z87891 Personal history of nicotine dependence: Secondary | ICD-10-CM | POA: Diagnosis not present

## 2020-05-13 DIAGNOSIS — Z3A30 30 weeks gestation of pregnancy: Secondary | ICD-10-CM | POA: Insufficient documentation

## 2020-05-13 DIAGNOSIS — O99213 Obesity complicating pregnancy, third trimester: Secondary | ICD-10-CM | POA: Insufficient documentation

## 2020-05-13 DIAGNOSIS — W01198A Fall on same level from slipping, tripping and stumbling with subsequent striking against other object, initial encounter: Secondary | ICD-10-CM | POA: Diagnosis not present

## 2020-05-13 DIAGNOSIS — R1030 Lower abdominal pain, unspecified: Secondary | ICD-10-CM | POA: Diagnosis not present

## 2020-05-13 DIAGNOSIS — O0990 Supervision of high risk pregnancy, unspecified, unspecified trimester: Secondary | ICD-10-CM

## 2020-05-13 DIAGNOSIS — E669 Obesity, unspecified: Secondary | ICD-10-CM | POA: Diagnosis not present

## 2020-05-13 DIAGNOSIS — A5901 Trichomonal vulvovaginitis: Secondary | ICD-10-CM

## 2020-05-13 DIAGNOSIS — S3991XA Unspecified injury of abdomen, initial encounter: Secondary | ICD-10-CM | POA: Diagnosis not present

## 2020-05-13 DIAGNOSIS — W19XXXA Unspecified fall, initial encounter: Secondary | ICD-10-CM

## 2020-05-13 DIAGNOSIS — O9A213 Injury, poisoning and certain other consequences of external causes complicating pregnancy, third trimester: Secondary | ICD-10-CM | POA: Diagnosis not present

## 2020-05-13 DIAGNOSIS — O479 False labor, unspecified: Secondary | ICD-10-CM

## 2020-05-13 DIAGNOSIS — Z6841 Body Mass Index (BMI) 40.0 and over, adult: Secondary | ICD-10-CM | POA: Insufficient documentation

## 2020-05-13 DIAGNOSIS — O99210 Obesity complicating pregnancy, unspecified trimester: Secondary | ICD-10-CM | POA: Diagnosis present

## 2020-05-13 DIAGNOSIS — Z3480 Encounter for supervision of other normal pregnancy, unspecified trimester: Secondary | ICD-10-CM

## 2020-05-13 HISTORY — DX: Body Mass Index (BMI) 40.0 and over, adult: Z684

## 2020-05-13 LAB — CBC
HCT: 38.1 % (ref 36.0–46.0)
Hemoglobin: 12.1 g/dL (ref 12.0–15.0)
MCH: 27.1 pg (ref 26.0–34.0)
MCHC: 31.8 g/dL (ref 30.0–36.0)
MCV: 85.4 fL (ref 80.0–100.0)
Platelets: 307 10*3/uL (ref 150–400)
RBC: 4.46 MIL/uL (ref 3.87–5.11)
RDW: 13.7 % (ref 11.5–15.5)
WBC: 11 10*3/uL — ABNORMAL HIGH (ref 4.0–10.5)
nRBC: 0.3 % — ABNORMAL HIGH (ref 0.0–0.2)

## 2020-05-13 LAB — TYPE AND SCREEN
ABO/RH(D): O POS
Antibody Screen: NEGATIVE

## 2020-05-13 MED ORDER — ZOLPIDEM TARTRATE 5 MG PO TABS: 5.0000 mg | ORAL_TABLET | Freq: Every evening | ORAL | Status: DC | PRN

## 2020-05-13 MED ORDER — ACETAMINOPHEN 325 MG PO TABS
650.0000 mg | ORAL_TABLET | ORAL | Status: DC | PRN
  Administered 2020-05-13: 650 mg via ORAL
  Filled 2020-05-13: qty 2

## 2020-05-13 MED ORDER — CALCIUM CARBONATE ANTACID 500 MG PO CHEW: 2.0000 | CHEWABLE_TABLET | ORAL | Status: DC | PRN

## 2020-05-13 MED ORDER — PRENATAL MULTIVITAMIN CH
1.0000 | ORAL_TABLET | Freq: Every day | ORAL | Status: DC
  Administered 2020-05-14: 1 via ORAL
  Filled 2020-05-13: qty 1

## 2020-05-13 MED ORDER — DOCUSATE SODIUM 100 MG PO CAPS: 100.0000 mg | ORAL_CAPSULE | Freq: Every day | ORAL | Status: DC

## 2020-05-13 NOTE — H&P (Signed)
Chief Complaint:  Decreased Fetal Movement and Fall   Event Date/Time   First Provider Initiated Contact with Patient 05/13/20 2140     HPI: Brianna Lee is a 30 y.o. G2P0010 at 25w6dwho presents to maternity admissions reporting falling while getting in to shower tonight.  Larey Seat forward and hit abdomen directly  Does not feel any movement.   Has tenderness on lower abdomen. . She denies LOF, vaginal bleeding, vaginal itching/burning, urinary symptoms, h/a, dizziness, n/v, diarrhea, constipation or fever/chills. Feels some movement now.   Fall The accident occurred 1 to 3 hours ago. Fall occurred: onto side of tub  She fell from a height of 1 to 2 ft. There was no blood loss. Point of impact: abdomen. Pain location: abdomen and right knee. The pain is moderate. The symptoms are aggravated by pressure on injury. Associated symptoms include abdominal pain. Pertinent negatives include no fever, headaches, numbness or tingling. She has tried nothing for the symptoms.   RN Note: About 2045 slipped getting into shower and my stomach hit the side of the tub. I have not felt FM since then. Denies VB or vag d/c. Some lower abd pain and R knee pain.    Past Medical History: Past Medical History:  Diagnosis Date  . Scoliosis     Past obstetric history: OB History  Gravida Para Term Preterm AB Living  2       1    SAB IAB Ectopic Multiple Live Births  1            # Outcome Date GA Lbr Len/2nd Weight Sex Delivery Anes PTL Lv  2 Current           1 SAB 07/14/19 [redacted]w[redacted]d    SAB       Past Surgical History: Past Surgical History:  Procedure Laterality Date  . GALLBLADDER SURGERY      Family History: Family History  Problem Relation Age of Onset  . Hypertension Father   . Diabetes Father     Social History: Social History   Tobacco Use  . Smoking status: Former Smoker    Types: Cigarettes  . Smokeless tobacco: Never Used  . Tobacco comment: 2 cigarettes a day   Vaping Use  . Vaping  Use: Never used  Substance Use Topics  . Alcohol use: Not Currently  . Drug use: Never    Allergies:  Allergies  Allergen Reactions  . Naproxen Swelling    Meds:  Medications Prior to Admission  Medication Sig Dispense Refill Last Dose  . aspirin (ASPIRIN CHILDRENS) 81 MG chewable tablet Chew 1 tablet (81 mg total) by mouth daily. 64 tablet 0   . Blood Pressure Monitoring (BLOOD PRESSURE MONITOR AUTOMAT) DEVI 1 Device by Does not apply route daily. Automatic blood pressure cuff regular size. To monitor blood pressure regularly at home. ICD-10 code:Z34.90 1 each 0   . clindamycin (CLINDAGEL) 1 % gel Apply topically 2 (two) times daily. Apply to affected groin area (Patient not taking: Reported on 03/26/2020) 30 g 11   . nystatin (MYCOSTATIN/NYSTOP) powder Apply 1 application topically 3 (three) times daily. Apply on affected area under abdomen (Patient not taking: Reported on 03/26/2020) 15 g 0   . Prenatal Vit-Fe Fumarate-FA (PREPLUS) 27-1 MG TABS Take 1 tablet by mouth daily. 90 tablet 3     I have reviewed patient's Past Medical Hx, Surgical Hx, Family Hx, Social Hx, medications and allergies.   ROS:  Review of Systems  Constitutional: Negative for  fever.  Gastrointestinal: Positive for abdominal pain.  Neurological: Negative for tingling, numbness and headaches.   Other systems negative  Physical Exam   Patient Vitals for the past 24 hrs:  BP Temp Pulse Resp Height Weight  05/13/20 2123 126/82 - (!) 114 - - -  05/13/20 2120 - 98.3 F (36.8 C) - 18 5\' 4"  (1.626 m) 130.2 kg   Constitutional: Well-developed, well-nourished female in no acute distress.  Cardiovascular: normal rate and rhythm Respiratory: normal effort, clear to auscultation bilaterally GI: Abd soft, non-tender throughout but tender in center lower abdomen to palpation, abdomen gravid and appropriate for gestational age.   No rebound or guarding. MS: Extremities nontender, no edema, normal ROM Neurologic:  Alert and oriented x 4.  GU: Neg CVAT.  FHT:  Baseline 145 , moderate variability, accelerations present, 1 small variable deceleration Contractions: q 4 mins Irregular    Labs: Ordered cbc, T/S, and Kleihauer-Betke per Dr O/Positive/-- (08/24 1012)  Imaging:    MAU Course/MDM: I have ordered labs and placed admission orders NST reviewed and is equivocal but reassuring for gestational age with one small variable decel Consult Dr 04-01-1989 with presentation, exam findings and test results.  Since she had a direct blow to abdomen and has contractions, will admit overnight for observation.  Treatments in MAU included EFM.    Assessment: Single IUP at [redacted]w[redacted]d S/P Fall with direct blow to abdomen Mild uterine contractions  Plan: Admit to [facility] for overnight observation.  Routine orders MD to follow  [facility] CNM, MSN Certified Nurse-Midwife 05/13/2020 9:40 PM

## 2020-05-13 NOTE — MAU Note (Signed)
About 2045 slipped getting into shower and my stomach hit the side of the tub. I have not felt FM since then. Denies VB or vag d/c. Some lower abd pain and R knee pain.

## 2020-05-13 NOTE — MAU Provider Note (Addendum)
Chief Complaint:  Decreased Fetal Movement and Fall   Event Date/Time   First Provider Initiated Contact with Patient 05/13/20 2140     HPI: Brianna Lee is a 30 y.o. G2P0010 at 29w6dwho presents to maternity admissions reporting falling while getting in to shower tonight.  Fell forward and hit abdomen directly  Does not feel any movement.   Has tenderness on lower abdomen. . She denies LOF, vaginal bleeding, vaginal itching/burning, urinary symptoms, h/a, dizziness, n/v, diarrhea, constipation or fever/chills. Feels some movement now.   Fall The accident occurred 1 to 3 hours ago. Fall occurred: onto side of tub  She fell from a height of 1 to 2 ft. There was no blood loss. Point of impact: abdomen. Pain location: abdomen and right knee. The pain is moderate. The symptoms are aggravated by pressure on injury. Associated symptoms include abdominal pain. Pertinent negatives include no fever, headaches, numbness or tingling. She has tried nothing for the symptoms.   RN Note: About 2045 slipped getting into shower and my stomach hit the side of the tub. I have not felt FM since then. Denies VB or vag d/c. Some lower abd pain and R knee pain.    Past Medical History: Past Medical History:  Diagnosis Date  . Scoliosis     Past obstetric history: OB History  Gravida Para Term Preterm AB Living  2       1    SAB IAB Ectopic Multiple Live Births  1            # Outcome Date GA Lbr Len/2nd Weight Sex Delivery Anes PTL Lv  2 Current           1 SAB 07/14/19 [redacted]w[redacted]d    SAB       Past Surgical History: Past Surgical History:  Procedure Laterality Date  . GALLBLADDER SURGERY      Family History: Family History  Problem Relation Age of Onset  . Hypertension Father   . Diabetes Father     Social History: Social History   Tobacco Use  . Smoking status: Former Smoker    Types: Cigarettes  . Smokeless tobacco: Never Used  . Tobacco comment: 2 cigarettes a day   Vaping Use  . Vaping  Use: Never used  Substance Use Topics  . Alcohol use: Not Currently  . Drug use: Never    Allergies:  Allergies  Allergen Reactions  . Naproxen Swelling    Meds:  Medications Prior to Admission  Medication Sig Dispense Refill Last Dose  . aspirin (ASPIRIN CHILDRENS) 81 MG chewable tablet Chew 1 tablet (81 mg total) by mouth daily. 64 tablet 0   . Blood Pressure Monitoring (BLOOD PRESSURE MONITOR AUTOMAT) DEVI 1 Device by Does not apply route daily. Automatic blood pressure cuff regular size. To monitor blood pressure regularly at home. ICD-10 code:Z34.90 1 each 0   . clindamycin (CLINDAGEL) 1 % gel Apply topically 2 (two) times daily. Apply to affected groin area (Patient not taking: Reported on 03/26/2020) 30 g 11   . nystatin (MYCOSTATIN/NYSTOP) powder Apply 1 application topically 3 (three) times daily. Apply on affected area under abdomen (Patient not taking: Reported on 03/26/2020) 15 g 0   . Prenatal Vit-Fe Fumarate-FA (PREPLUS) 27-1 MG TABS Take 1 tablet by mouth daily. 90 tablet 3     I have reviewed patient's Past Medical Hx, Surgical Hx, Family Hx, Social Hx, medications and allergies.   ROS:  Review of Systems  Constitutional: Negative for   fever.  Gastrointestinal: Positive for abdominal pain.  Neurological: Negative for tingling, numbness and headaches.   Other systems negative  Physical Exam   Patient Vitals for the past 24 hrs:  BP Temp Pulse Resp Height Weight  05/13/20 2123 126/82 - (!) 114 - - -  05/13/20 2120 - 98.3 F (36.8 C) - 18 5' 4" (1.626 m) 130.2 kg   Constitutional: Well-developed, well-nourished female in no acute distress.  Cardiovascular: normal rate and rhythm Respiratory: normal effort, clear to auscultation bilaterally GI: Abd soft, non-tender throughout but tender in center lower abdomen to palpation, abdomen gravid and appropriate for gestational age.   No rebound or guarding. MS: Extremities nontender, no edema, normal ROM Neurologic:  Alert and oriented x 4.  GU: Neg CVAT.  FHT:  Baseline 145 , moderate variability, accelerations present, 1 small variable deceleration Contractions: q 4 mins Irregular    Labs: Ordered cbc, T/S, and Kleihauer-Betke per Dr Pickens O/Positive/-- (08/24 1012)  Imaging:    MAU Course/MDM: I have ordered labs and placed admission orders NST reviewed and is equivocal but reassuring for gestational age with one small variable decel Consult Dr Pickens with presentation, exam findings and test results.  Since she had a direct blow to abdomen and has contractions, will admit overnight for observation.  Treatments in MAU included EFM.    Assessment: Single IUP at [redacted]w[redacted]d S/P Fall with direct blow to abdomen Mild uterine contractions  Plan: Admit to [facility] for overnight observation.  Routine orders MD to follow  Sanjuan Sawa CNM, MSN Certified Nurse-Midwife 05/13/2020 9:40 PM  

## 2020-05-14 ENCOUNTER — Telehealth: Payer: Self-pay | Admitting: *Deleted

## 2020-05-14 DIAGNOSIS — Z3A30 30 weeks gestation of pregnancy: Secondary | ICD-10-CM

## 2020-05-14 DIAGNOSIS — O99213 Obesity complicating pregnancy, third trimester: Secondary | ICD-10-CM | POA: Diagnosis not present

## 2020-05-14 DIAGNOSIS — S3991XA Unspecified injury of abdomen, initial encounter: Secondary | ICD-10-CM

## 2020-05-14 DIAGNOSIS — E669 Obesity, unspecified: Secondary | ICD-10-CM

## 2020-05-14 DIAGNOSIS — W182XXA Fall in (into) shower or empty bathtub, initial encounter: Secondary | ICD-10-CM | POA: Diagnosis not present

## 2020-05-14 LAB — KLEIHAUER-BETKE STAIN
Fetal Cells %: 0 %
Quantitation Fetal Hemoglobin: 0 mL

## 2020-05-14 MED ORDER — DOCUSATE SODIUM 100 MG PO CAPS: 100.0000 mg | ORAL_CAPSULE | Freq: Two times a day (BID) | ORAL | Status: DC | PRN

## 2020-05-14 NOTE — Progress Notes (Signed)
I received a consult that Brianna Lee requested spiritual support.  I spent time with her and facilitated sharing of feelings.  She was very scared last night when she didn't feel the baby moving.  I normalized her feelings and commended her on coming in to the hospital. She has a very supportive partner who has become especially protective of her now that she is pregnant.  She feels frustrated at times that she is not able to do more but I reminded her of the amazing work her body is doing growing their baby.  They have good family support from both sides and they are thinking about childcare and what changes that may entail for the.  I offered listening support and facilitated the sharing of emotions.  Chaplain Basilio Cairo, Bcc Pager, [phone]

## 2020-05-14 NOTE — Telephone Encounter (Signed)
-----   Message from Tereso Newcomer, MD sent at 05/14/2020  9:09 AM EST ----- Regarding: Needs follow up appt Good morning  Seen on 1/20, currently at 30 weeks, admitted after fall onto abdomen.  Needs follow up with any provider next week.   Thank you!   [facility]

## 2020-05-14 NOTE — Discharge Instructions (Signed)
Preventing Preterm Birth Preterm birth is when a baby is delivered between 20 weeks and 37 weeks of pregnancy. A full-term pregnancy lasts for at least 37 weeks. Preterm birth can be dangerous for your baby because the last few weeks of pregnancy are an important time for your baby to grow and reach a normal birth weight. How can preterm birth affect my baby? Complications of preterm birth may include:  Breathing problems.  Brain damage that affects movement and coordination (cerebral palsy).  Trouble with feeding.  Problems with vision or hearing.  Infections or inflammation of the digestive tract (colitis).  Developmental delays and learning disabilities.  Low birth weight or very low birth weight.  Higher risk for diabetes, heart disease, and high blood pressure later in life. What can increase my risk of having a preterm birth? The exact cause of preterm birth is unknown. The following factors make you more likely to have a preterm birth:  Being diagnosed with placenta previa. This is a condition in which the placenta covers the lowest part of your uterus (cervix), which opens into the vagina.  Certain conditions of your current and past pregnancies, such as: ? Having had a preterm birth before. ? Being pregnant with multiples. ? Waiting less than 6 months between giving birth and becoming pregnant again. ? Certain abnormalities in your unborn baby. ? Vaginal bleeding during pregnancy. ? Becoming pregnant through in vitro fertilization (IVF).  Being overweight or underweight.  Medical history of: ? STIs (sexually transmitted infections) or other infections of the urinary tract and the vagina. ? Long-term (chronic) illnesses, such as blood clotting problems, diabetes, or high blood pressure. ? Short cervix.  Lifestyle and environmental factors, such as: ? Using tobacco products or drugs. ? Drinking alcohol. ? Having stress and no social support. ? Violence in the home  (domestic violence). ? Being exposed to certain chemicals or pollutants in the environment. What actions can I take to prevent preterm birth? Medical care The most important thing you can do to lower your risk for preterm birth is to get routine medical care during pregnancy (prenatal care). Keep all follow-up visits as told by your health care provider. This is important.  If you have a high risk of preterm birth:  You may be referred to a health care provider who specializes in managing high-risk pregnancies (perinatologist).  You may be given medicine to help prevent preterm birth. Lifestyle Certain lifestyle changes can also lower your risk of preterm birth:  Wait at least 6 months after a pregnancy to become pregnant again.  Get to a healthy weight before getting pregnant. If you are overweight, work with your health care provider to safely lose weight.  Do not use any products that contain nicotine or tobacco, such as cigarettes, e-cigarettes, and chewing tobacco. If you need help quitting, ask your health care provider.  Do not drink alcohol.  Do not use drugs.  Eat a healthy diet.  Manage other medical problems, such as diabetes or high blood pressure.   Where to find support For more support, consider:  Talking with your health care provider.  Talking with a therapist or substance abuse counselor, if you need help quitting.  Working with a dietitian or a personal trainer to maintain a healthy weight.  Joining a support group. Where to find more information Learn more about preventing preterm birth from:  Centers for Disease Control and Prevention: cdc.gov  March of Dimes: marchofdimes.org  American Pregnancy Association: americanpregnancy.org Contact a   health care provider if: You have any of the following signs or symptoms of preterm labor before 37 weeks:  A change or increase in vaginal discharge.  Fluid leaking from your vagina.  Pressure or cramps in  your lower abdomen.  A backache that does not go away or gets worse.  Regular tightening (contractions) in your lower abdomen. Get help right away if:  You are having regular painful contractions every 5 minutes or less.  Your water breaks. Summary  Preterm birth means having your baby during weeks 20-37 of pregnancy.  Preterm birth may put your baby at risk for physical and mental problems.  The exact cause of preterm birth is unknown. However, being diagnosed with placenta previa or having vaginal bleeding or an STI (sexually transmitted infection) increases your risk for preterm birth.  Getting good prenatal care can help prevent preterm birth. Keep all follow-up visits as told by your health care provider. This is important.  Contact a health care provider if you have signs or symptoms of preterm labor. This information is not intended to replace advice given to you by your health care provider. Make sure you discuss any questions you have with your health care provider. Document Revised: 03/10/2019 Document Reviewed: 03/10/2019 Elsevier Patient Education  2021 Elsevier Inc.  

## 2020-05-14 NOTE — Discharge Summary (Signed)
Antenatal Physician Discharge Summary  Patient ID: Brianna Lee MRN: [id number] DOB/AGE: 1990/04/24 30 y.o.  Admit date: 05/13/2020 Discharge date: 05/14/2020  Admission Diagnoses:  Principal Problem:   Fall onto gravid abdomen in third trimester Active Problems:   Supervision of other normal pregnancy, antepartum   Obesity affecting pregnancy, antepartum   [redacted] weeks gestation of pregnancy  Discharge Diagnoses:  The same, Category I FHR tracing  Prenatal Procedures: NST, continuous [facility]  Hospital Course:  Brianna Lee is a 30 y.o. G2P0010 with IUP at [redacted]w[redacted]d admitted for observation after sustaining a fall onto her abdomen while trying to get into her tub at home.  She was admitted with rare contractions.  No leaking of fluid and no bleeding. Had good fetal movement.  She was observed for about 24 hours, continuous fetal heart rate monitoring remained reassuring, and she had no signs/symptoms of bleeding, preterm labor or other maternal-fetal concerns.  She was deemed stable for discharge to home with outpatient follow up next week in the office.  Discharge Exam: Temp:  [98.1 F (36.7 C)-98.5 F (36.9 C)] 98.5 F (36.9 C) (01/28 1233) Pulse Rate:  [98-114] 98 (01/28 1233) Resp:  [18-19] 18 (01/28 1233) BP: (103-133)/(46-82) 110/71 (01/28 1233) SpO2:  [100 %] 100 % (01/28 1233) Weight:  [130.2 kg] 130.2 kg (01/27 2120) Physical Examination: CONSTITUTIONAL: Well-developed, well-nourished female in no acute distress.  SKIN: Skin is warm and dry. No rash noted. Not diaphoretic. No erythema. No pallor. NEUROLGIC: Alert and oriented to person, place, and time. Normal reflexes, muscle tone coordination. No cranial nerve deficit noted. PSYCHIATRIC: Normal mood and affect. Normal behavior. Normal judgment and thought content. CARDIOVASCULAR: Normal heart rate noted, regular rhythm RESPIRATORY: Effort and breath sounds normal, no problems with respiration noted MUSCULOSKELETAL: Normal range of  motion. No edema and no tenderness. 2+ distal pulses. ABDOMEN: Soft, nontender, nondistended, gravid. CERVIX:    Fetal monitoring: FHR: 140 bpm, Variability: moderate, Accelerations: 10 x 10 Present, Decelerations: Absent  Uterine activity: Rare contractions   Significant Diagnostic Studies:  Results for orders placed or performed during the hospital encounter of 05/13/20 (from the past 168 hour(s))  Type and screen [facility]   Collection Time: 05/13/20 10:34 PM  Result Value Ref Range   ABO/RH(D) O POS    Antibody Screen NEG    Sample Expiration      05/16/2020,2359 Performed at [facility], [address], [place], [place] [address]   CBC on admission   Collection Time: 05/13/20 10:36 PM  Result Value Ref Range   WBC 11.0 (H) 4.0 - 10.5 K/uL   RBC 4.46 3.87 - 5.11 MIL/uL   Hemoglobin 12.1 12.0 - 15.0 g/dL   HCT 98.3 38.2 - 50.5 %   MCV 85.4 80.0 - 100.0 fL   MCH 27.1 26.0 - 34.0 pg   MCHC 31.8 30.0 - 36.0 g/dL   RDW 39.7 67.3 - 41.9 %   Platelets 307 150 - 400 K/uL   nRBC 0.3 (H) 0.0 - 0.2 %  Kleihauer-Betke stain   Collection Time: 05/13/20 10:36 PM  Result Value Ref Range   Fetal Cells % 0 %   Quantitation Fetal Hemoglobin 0.0000 mL   # Vials RhIg NOT INDICATED     Future Appointments  Date Time Provider Department Center  05/17/2020 12:30 PM [facility] NURSE [facility]  05/17/2020 12:45 PM [facility] US5 [facility]  05/17/2020  1:45 PM [facility] NST [facility]  05/19/2020  2:10 PM Arita Miss,  Rolitta, CNM [facility] None  05/24/2020  1:00 PM [facility] NURSE [facility]  05/24/2020  1:15 PM [facility] US2 [facility]    Discharge Condition: Stable  Discharge disposition: 01-Home or Self Care   Discharge Instructions    Discharge activity:  No Restrictions   Complete by: As directed    Discharge diet:  No restrictions   Complete by: As directed    Fetal Kick Count:  Lie on our left side for one hour after a meal, and count the number of times your  baby kicks.  If it is less than 5 times, get up, move around and drink some juice.  Repeat the test 30 minutes later.  If it is still less than 5 kicks in an hour, notify your doctor.   Complete by: As directed    No sexual activity restrictions   Complete by: As directed    Notify physician for leaking of fluid   Complete by: As directed    Notify physician for uterine contractions.  These may be painless and feel like the uterus is tightening or the baby is  "balling up"   Complete by: As directed    Notify physician for vaginal bleeding   Complete by: As directed    PRETERM LABOR:  Includes any of the follwing symptoms that occur between 20 - [redacted] weeks gestation.  If these symptoms are not stopped, preterm labor can result in preterm delivery, placing your baby at risk   Complete by: As directed      Allergies as of 05/14/2020      Reactions   Naproxen Swelling      Medication List    TAKE these medications   aspirin 81 MG chewable tablet Commonly known as: Aspirin Childrens Chew 1 tablet (81 mg total) by mouth daily.   Blood Pressure Monitor Automat Devi 1 Device by Does not apply route daily. Automatic blood pressure cuff regular size. To monitor blood pressure regularly at home. ICD-10 code:Z34.90   clindamycin 1 % gel Commonly known as: CLINDAGEL Apply topically 2 (two) times daily. Apply to affected groin area   nystatin powder Commonly known as: MYCOSTATIN/NYSTOP Apply 1 application topically 3 (three) times daily. Apply on affected area under abdomen   PrePLUS 27-1 MG Tabs Take 1 tablet by mouth daily.        Total discharge time: 20 minutes    Signed: Jaynie Collins M.D. 05/14/2020, 8:30 PM

## 2020-05-14 NOTE — Telephone Encounter (Signed)
Left voice message to return nurse call for follow up appointment next.  Clovis Pu, RN

## 2020-05-17 ENCOUNTER — Ambulatory Visit (HOSPITAL_BASED_OUTPATIENT_CLINIC_OR_DEPARTMENT_OTHER): Payer: Medicaid Other | Admitting: *Deleted

## 2020-05-17 ENCOUNTER — Ambulatory Visit: Payer: Medicaid Other | Admitting: *Deleted

## 2020-05-17 ENCOUNTER — Ambulatory Visit: Payer: Medicaid Other | Attending: Obstetrics and Gynecology

## 2020-05-17 ENCOUNTER — Encounter: Payer: Self-pay | Admitting: *Deleted

## 2020-05-17 ENCOUNTER — Other Ambulatory Visit: Payer: Self-pay

## 2020-05-17 DIAGNOSIS — E669 Obesity, unspecified: Secondary | ICD-10-CM | POA: Diagnosis not present

## 2020-05-17 DIAGNOSIS — Z148 Genetic carrier of other disease: Secondary | ICD-10-CM

## 2020-05-17 DIAGNOSIS — Z362 Encounter for other antenatal screening follow-up: Secondary | ICD-10-CM | POA: Diagnosis not present

## 2020-05-17 DIAGNOSIS — O365920 Maternal care for other known or suspected poor fetal growth, second trimester, not applicable or unspecified: Secondary | ICD-10-CM | POA: Diagnosis present

## 2020-05-17 DIAGNOSIS — O99213 Obesity complicating pregnancy, third trimester: Secondary | ICD-10-CM

## 2020-05-17 DIAGNOSIS — O321XX0 Maternal care for breech presentation, not applicable or unspecified: Secondary | ICD-10-CM | POA: Diagnosis not present

## 2020-05-17 DIAGNOSIS — O23591 Infection of other part of genital tract in pregnancy, first trimester: Secondary | ICD-10-CM | POA: Insufficient documentation

## 2020-05-17 DIAGNOSIS — O365930 Maternal care for other known or suspected poor fetal growth, third trimester, not applicable or unspecified: Secondary | ICD-10-CM

## 2020-05-17 DIAGNOSIS — O365990 Maternal care for other known or suspected poor fetal growth, unspecified trimester, not applicable or unspecified: Secondary | ICD-10-CM | POA: Insufficient documentation

## 2020-05-17 DIAGNOSIS — A5901 Trichomonal vulvovaginitis: Secondary | ICD-10-CM

## 2020-05-17 DIAGNOSIS — Z3480 Encounter for supervision of other normal pregnancy, unspecified trimester: Secondary | ICD-10-CM

## 2020-05-17 DIAGNOSIS — O365931 Maternal care for other known or suspected poor fetal growth, third trimester, fetus 1: Secondary | ICD-10-CM | POA: Diagnosis not present

## 2020-05-17 DIAGNOSIS — Z3A30 30 weeks gestation of pregnancy: Secondary | ICD-10-CM

## 2020-05-17 DIAGNOSIS — O9A213 Injury, poisoning and certain other consequences of external causes complicating pregnancy, third trimester: Secondary | ICD-10-CM | POA: Insufficient documentation

## 2020-05-17 NOTE — Procedures (Signed)
Shterna Laramee October 26, 1990 [redacted]w[redacted]d  Fetus A Non-Stress Test Interpretation for 05/17/20  Indication: Fetal Growth Restriction  Fetal Heart Rate A Mode: External Baseline Rate (A): 145 bpm Variability: Moderate Accelerations: 10 x 10 Decelerations: Variable Multiple birth?: No  Uterine Activity Mode: Palpation,Toco Contraction Frequency (min): none Resting Tone Palpated: Relaxed Resting Time: Adequate  Interpretation (Fetal Testing) Nonstress Test Interpretation: Reactive Overall Impression: Reassuring for gestational age Comments: Dr. Parke Poisson reviewed tracing.

## 2020-05-18 ENCOUNTER — Other Ambulatory Visit: Payer: Self-pay | Admitting: *Deleted

## 2020-05-18 DIAGNOSIS — O365930 Maternal care for other known or suspected poor fetal growth, third trimester, not applicable or unspecified: Secondary | ICD-10-CM

## 2020-05-19 ENCOUNTER — Ambulatory Visit (INDEPENDENT_AMBULATORY_CARE_PROVIDER_SITE_OTHER): Payer: Medicaid Other | Admitting: Obstetrics and Gynecology

## 2020-05-19 ENCOUNTER — Encounter: Payer: Self-pay | Admitting: Obstetrics and Gynecology

## 2020-05-19 ENCOUNTER — Other Ambulatory Visit: Payer: Self-pay

## 2020-05-19 VITALS — BP 139/93 | HR 108 | Temp 98.2°F | Wt 290.0 lb

## 2020-05-19 DIAGNOSIS — Z3480 Encounter for supervision of other normal pregnancy, unspecified trimester: Secondary | ICD-10-CM | POA: Diagnosis not present

## 2020-05-19 DIAGNOSIS — Z3A30 30 weeks gestation of pregnancy: Secondary | ICD-10-CM

## 2020-05-19 DIAGNOSIS — O26893 Other specified pregnancy related conditions, third trimester: Secondary | ICD-10-CM

## 2020-05-19 DIAGNOSIS — O163 Unspecified maternal hypertension, third trimester: Secondary | ICD-10-CM

## 2020-05-19 DIAGNOSIS — R102 Pelvic and perineal pain: Secondary | ICD-10-CM

## 2020-05-19 DIAGNOSIS — N898 Other specified noninflammatory disorders of vagina: Secondary | ICD-10-CM

## 2020-05-19 MED ORDER — COMFORT FIT MATERNITY SUPP SM MISC: 1.0000 [IU] | Freq: Every day | 0 refills | Status: DC | PRN

## 2020-05-19 NOTE — Progress Notes (Signed)
   LOW-RISK PREGNANCY OFFICE VISIT Patient name: Brianna Lee MRN [id number]  Date of birth: Jul 24, 1990 Chief Complaint:   Routine Prenatal Visit  History of Present Illness:   Brianna Lee is a 30 y.o. G2P0010 female at [redacted]w[redacted]d with an Estimated Date of Delivery: 07/23/20 being seen today for ongoing management of a low-risk pregnancy.  Today she reports pelvic pressure. She was recently seen in MAU after a fall and reports that she is doing much better than she was doing. She also reports good FM since the fall. Contractions: Not present. Vag. Bleeding: None.  Movement: Present. denies leaking of fluid. Review of Systems:   Pertinent items are noted in HPI Denies abnormal vaginal discharge w/ itching/odor/irritation, headaches, visual changes, shortness of breath, chest pain, abdominal pain, severe nausea/vomiting, or problems with urination or bowel movements unless otherwise stated above. Pertinent History Reviewed:  Reviewed past medical,surgical, social, obstetrical and family history.  Reviewed problem list, medications and allergies. Physical Assessment:   Vitals:   05/19/20 1421 05/19/20 1456  BP: (!) 130/91 (!) 139/93  Pulse: (!) 118 (!) 108  Temp: 98.2 F (36.8 C)   Weight: 290 lb (131.5 kg)   Body mass index is 49.78 kg/m.        Physical Examination:   General appearance: Well appearing, and in no distress  Mental status: Alert, oriented to person, place, and time  Skin: Warm & dry  Cardiovascular: Normal heart rate noted  Respiratory: Normal respiratory effort, no distress  Abdomen: Soft, gravid, nontender  Pelvic: Cervical exam performed         Extremities: Edema: None  Fetal Status: Fetal Heart Rate (bpm): 159   Movement: Present    No results found for this or any previous visit (from the past 24 hour(s)).  Assessment & Plan:  1) Low-risk pregnancy G2P0010 at [redacted]w[redacted]d with an Estimated Date of Delivery: 07/23/20   2) Supervision of other normal pregnancy, antepartum  -  S/P fall  3) Elevated blood pressure complicating pregnancy in third trimester, antepartum - Protein / creatinine ratio, urine,  - Comprehensive metabolic panel,  - CBC - BP recheck in 1 week  4) Pelvic pressure in pregnancy, third trimester  - Rx for Elastic Bandages & Supports (COMFORT FIT MATERNITY SUPP SM) MISC - Information provided on maternity support belt   5) Vaginal discharge - Negative Amniotest  6) [redacted] weeks gestation of pregnancy   Meds:  Meds ordered this encounter  Medications  . Elastic Bandages & Supports (COMFORT FIT MATERNITY SUPP SM) MISC    Sig: 1 Units by Does not apply route daily as needed.    Dispense:  1 each    Refill:  0    Order Specific Question:   Supervising Provider    Answer:   Reva Bores [[id number]]   Labs/procedures today: PEC labs and Amniotest  Plan:  Continue routine obstetrical care   Reviewed: Preterm labor symptoms and general obstetric precautions including but not limited to vaginal bleeding, contractions, leaking of fluid and fetal movement were reviewed in detail with the patient.  All questions were answered. Has home bp cuff. Check bp weekly, let us know if >140/90.   Follow-up: Return in about 1 week (around 05/26/2020) for BP check with RN - OB visit in 2 weeks.  Orders Placed This Encounter  Procedures  . Protein / creatinine ratio, urine  . Comprehensive metabolic panel  . CBC   Raelyn Mora MSN, CNM 05/19/2020 2:37 PM

## 2020-05-19 NOTE — Patient Instructions (Addendum)
   PREGNANCY SUPPORT BELT: You are not alone, Seventy-five percent of women have some sort of abdominal or back pain at some point in their pregnancy. Your baby is growing at a fast pace, which means that your whole body is rapidly trying to adjust to the changes. As your uterus grows, your back may start feeling a bit under stress and this can result in back or abdominal pain that can go from mild, and therefore bearable, to severe pains that will not allow you to sit or lay down comfortably, When it comes to dealing with pregnancy-related pains and cramps, some pregnant women usually prefer natural remedies, which the market is filled with nowadays. For example, wearing a pregnancy support belt can help ease and lessen your discomfort and pain.  WHAT ARE THE BENEFITS OF WEARING A PREGNANCY SUPPORT BELT? A pregnancy support belt provides support to the lower portion of the belly taking some of the weight of the growing uterus and distributing to the other parts of your body. It is designed make you comfortable and gives you extra support. Over the years, the pregnancy apparel market has been studying the needs and wants of pregnant women and they have come up with the most comfortable pregnancy support belts that woman could ever ask for. In fact, you will no longer have to wear a stretched-out or bulky pregnancy belt that is visible underneath your clothes and makes you feel even more uncomfortable. Nowadays, a pregnancy support belt is made of comfortable and stretchy materials that will not irritate your skin but will actually make you feel at ease and you will not even notice you are wearing it. They are easy to put on and adjust during the day and can be worn at night for additional support.  BENEFITS: . Relives Back pain . Relieves Abdominal Muscle and Leg Pain . Stabilizes the Pelvic Ring . Offers a Cushioned Abdominal Lift Pad . Relieves pressure on the Sciatic Nerve Within Minutes WHERE TO GET  YOUR PREGNANCY BELT:   [facility] [address] [place], [place] [address] [phone]

## 2020-05-20 LAB — CBC
Hematocrit: 40.2 % (ref 34.0–46.6)
Hemoglobin: 13 g/dL (ref 11.1–15.9)
MCH: 27 pg (ref 26.6–33.0)
MCHC: 32.3 g/dL (ref 31.5–35.7)
MCV: 83 fL (ref 79–97)
Platelets: 323 10*3/uL (ref 150–450)
RBC: 4.82 x10E6/uL (ref 3.77–5.28)
RDW: 13.8 % (ref 11.7–15.4)
WBC: 10.8 10*3/uL (ref 3.4–10.8)

## 2020-05-20 LAB — COMPREHENSIVE METABOLIC PANEL
ALT: 27 IU/L (ref 0–32)
AST: 24 IU/L (ref 0–40)
Albumin/Globulin Ratio: 0.9 — ABNORMAL LOW (ref 1.2–2.2)
Albumin: 3 g/dL — ABNORMAL LOW (ref 3.9–5.0)
Alkaline Phosphatase: 159 IU/L — ABNORMAL HIGH (ref 44–121)
BUN/Creatinine Ratio: 11 (ref 9–23)
BUN: 6 mg/dL (ref 6–20)
Bilirubin Total: <0.2 mg/dL (ref 0.0–1.2)
CO2: 21 mmol/L (ref 20–29)
Calcium: 9.2 mg/dL (ref 8.7–10.2)
Chloride: 102 mmol/L (ref 96–106)
Creatinine, Ser: 0.56 mg/dL — ABNORMAL LOW (ref 0.57–1.00)
GFR calc Af Amer: 146 mL/min/{1.73_m2} (ref >59)
GFR calc non Af Amer: 126 mL/min/{1.73_m2} (ref >59)
Globulin, Total: 3.2 g/dL (ref 1.5–4.5)
Glucose: 70 mg/dL (ref 65–99)
Potassium: 4.2 mmol/L (ref 3.5–5.2)
Sodium: 136 mmol/L (ref 134–144)
Total Protein: 6.2 g/dL (ref 6.0–8.5)

## 2020-05-20 LAB — PROTEIN / CREATININE RATIO, URINE
Creatinine, Urine: 99.7 mg/dL
Protein, Ur: 17.8 mg/dL
Protein/Creat Ratio: 179 mg/g creat (ref 0–200)

## 2020-05-24 ENCOUNTER — Ambulatory Visit: Payer: Medicaid Other | Attending: Obstetrics and Gynecology

## 2020-05-24 ENCOUNTER — Encounter: Payer: Self-pay | Admitting: *Deleted

## 2020-05-24 ENCOUNTER — Ambulatory Visit: Payer: Medicaid Other | Admitting: *Deleted

## 2020-05-24 ENCOUNTER — Other Ambulatory Visit: Payer: Self-pay

## 2020-05-24 DIAGNOSIS — O23591 Infection of other part of genital tract in pregnancy, first trimester: Secondary | ICD-10-CM | POA: Diagnosis present

## 2020-05-24 DIAGNOSIS — E669 Obesity, unspecified: Secondary | ICD-10-CM | POA: Diagnosis not present

## 2020-05-24 DIAGNOSIS — O352XX0 Maternal care for (suspected) hereditary disease in fetus, not applicable or unspecified: Secondary | ICD-10-CM | POA: Diagnosis not present

## 2020-05-24 DIAGNOSIS — O365930 Maternal care for other known or suspected poor fetal growth, third trimester, not applicable or unspecified: Secondary | ICD-10-CM

## 2020-05-24 DIAGNOSIS — Z148 Genetic carrier of other disease: Secondary | ICD-10-CM | POA: Diagnosis not present

## 2020-05-24 DIAGNOSIS — Z3480 Encounter for supervision of other normal pregnancy, unspecified trimester: Secondary | ICD-10-CM | POA: Insufficient documentation

## 2020-05-24 DIAGNOSIS — A5901 Trichomonal vulvovaginitis: Secondary | ICD-10-CM | POA: Diagnosis present

## 2020-05-24 DIAGNOSIS — O365920 Maternal care for other known or suspected poor fetal growth, second trimester, not applicable or unspecified: Secondary | ICD-10-CM | POA: Diagnosis present

## 2020-05-24 DIAGNOSIS — O9A213 Injury, poisoning and certain other consequences of external causes complicating pregnancy, third trimester: Secondary | ICD-10-CM | POA: Insufficient documentation

## 2020-05-24 DIAGNOSIS — O99213 Obesity complicating pregnancy, third trimester: Secondary | ICD-10-CM

## 2020-05-24 DIAGNOSIS — Z3A31 31 weeks gestation of pregnancy: Secondary | ICD-10-CM | POA: Diagnosis not present

## 2020-05-24 NOTE — Procedures (Signed)
Brianna Lee 01/20/1991 [redacted]w[redacted]d  Fetus A Non-Stress Test Interpretation for 05/24/20  Indication: IUGR  Fetal Heart Rate A Mode: External Baseline Rate (A): 150 bpm Variability: Moderate Accelerations: 15 x 15 Decelerations: Variable Multiple birth?: No  Uterine Activity Mode: Palpation,Toco Contraction Frequency (min): 2-9 Contraction Duration (sec): 40-70 Contraction Quality: Mild Resting Tone Palpated: Relaxed Resting Time: Adequate  Interpretation (Fetal Testing) Nonstress Test Interpretation: Reactive Overall Impression: Reassuring for gestational age Comments: Dr. Judeth Cornfield reviewed tracing.

## 2020-05-26 ENCOUNTER — Other Ambulatory Visit: Payer: Self-pay

## 2020-05-26 ENCOUNTER — Ambulatory Visit (INDEPENDENT_AMBULATORY_CARE_PROVIDER_SITE_OTHER): Payer: Medicaid Other | Admitting: *Deleted

## 2020-05-26 VITALS — BP 129/88 | HR 116 | Temp 98.0°F | Ht 64.0 in | Wt 296.2 lb

## 2020-05-26 DIAGNOSIS — O163 Unspecified maternal hypertension, third trimester: Secondary | ICD-10-CM

## 2020-05-26 NOTE — Progress Notes (Signed)
   Subjective:  Brianna Lee is a 30 y.o. female here for BP check.   Hypertension ROS: taking medications as instructed, no medication side effects noted, no TIA's, no chest pain on exertion, no dyspnea on exertion, no swelling of ankles, no orthostatic dizziness or lightheadedness, no orthopnea or paroxysmal nocturnal dyspnea and no palpitations.    Objective:  BP 129/88 (BP Location: Right Arm, Patient Position: Sitting, Cuff Size: Large)   Pulse (!) 116   Temp 98 F (36.7 C) (Oral)   Ht 5\' 4"  (1.626 m)   Wt 296 lb 3.2 oz (134.4 kg)   LMP 09/23/2019   Breastfeeding No   BMI 50.84 kg/m  First BP 150/98 Heart Rate 116 Appearance alert, well appearing, and in no distress and oriented to person, place, and time. General exam BP noted to be well controlled today in office.    Assessment:   Blood Pressure asymptomatic and needs improvement.   Plan:  Current treatment plan is effective, no change in therapy.  Patient has been scheduled for BPP.  11/23/2019, RN

## 2020-05-31 ENCOUNTER — Ambulatory Visit: Payer: Medicaid Other | Admitting: *Deleted

## 2020-05-31 ENCOUNTER — Other Ambulatory Visit: Payer: Self-pay

## 2020-05-31 ENCOUNTER — Ambulatory Visit: Payer: Medicaid Other | Attending: Obstetrics

## 2020-05-31 ENCOUNTER — Encounter: Payer: Self-pay | Admitting: *Deleted

## 2020-05-31 DIAGNOSIS — Z3480 Encounter for supervision of other normal pregnancy, unspecified trimester: Secondary | ICD-10-CM

## 2020-05-31 DIAGNOSIS — O9A213 Injury, poisoning and certain other consequences of external causes complicating pregnancy, third trimester: Secondary | ICD-10-CM

## 2020-05-31 DIAGNOSIS — O23591 Infection of other part of genital tract in pregnancy, first trimester: Secondary | ICD-10-CM | POA: Diagnosis present

## 2020-05-31 DIAGNOSIS — A5901 Trichomonal vulvovaginitis: Secondary | ICD-10-CM

## 2020-05-31 DIAGNOSIS — O365930 Maternal care for other known or suspected poor fetal growth, third trimester, not applicable or unspecified: Secondary | ICD-10-CM | POA: Insufficient documentation

## 2020-06-02 ENCOUNTER — Ambulatory Visit (INDEPENDENT_AMBULATORY_CARE_PROVIDER_SITE_OTHER): Payer: Medicaid Other | Admitting: Obstetrics and Gynecology

## 2020-06-02 ENCOUNTER — Other Ambulatory Visit: Payer: Self-pay

## 2020-06-02 VITALS — BP 134/88 | HR 105 | Temp 98.1°F | Wt 302.4 lb

## 2020-06-02 DIAGNOSIS — Z3480 Encounter for supervision of other normal pregnancy, unspecified trimester: Secondary | ICD-10-CM

## 2020-06-02 DIAGNOSIS — Z3A32 32 weeks gestation of pregnancy: Secondary | ICD-10-CM

## 2020-06-02 DIAGNOSIS — O26843 Uterine size-date discrepancy, third trimester: Secondary | ICD-10-CM

## 2020-06-02 DIAGNOSIS — O365990 Maternal care for other known or suspected poor fetal growth, unspecified trimester, not applicable or unspecified: Secondary | ICD-10-CM

## 2020-06-02 DIAGNOSIS — O10919 Unspecified pre-existing hypertension complicating pregnancy, unspecified trimester: Secondary | ICD-10-CM

## 2020-06-02 DIAGNOSIS — O119 Pre-existing hypertension with pre-eclampsia, unspecified trimester: Secondary | ICD-10-CM | POA: Insufficient documentation

## 2020-06-02 NOTE — Progress Notes (Addendum)
LOW-RISK PREGNANCY OFFICE VISIT Patient name: Brianna Lee MRN [id number]  Date of birth: 08/29/1990 Chief Complaint:   Routine Prenatal Visit  History of Present Illness:   Brianna Lee is a 30 y.o. 602P0010 female at 2278w5d with an Estimated Date of Delivery: 07/23/20 being seen today for ongoing management of a low-risk pregnancy.  Today she reports irregular contractions and pressure. Contractions: Not present. Vag. Bleeding: None.  Movement: Present. denies leaking of fluid. Review of Systems:   Pertinent items are noted in HPI Denies abnormal vaginal discharge w/ itching/odor/irritation, headaches, visual changes, shortness of breath, chest pain, abdominal pain, severe nausea/vomiting, or problems with urination or bowel movements unless otherwise stated above. Pertinent History Reviewed:  Reviewed past medical,surgical, social, obstetrical and family history.  Reviewed problem list, medications and allergies. Physical Assessment:   Vitals:   06/02/20 1410  BP: 134/88  Pulse: (!) 105  Temp: 98.1 F (36.7 C)  Weight: (!) 302 lb 6.4 oz (137.2 kg)  Body mass index is 51.91 kg/m.        Physical Examination:   General appearance: Well appearing, and in no distress  Mental status: Alert, oriented to person, place, and time  Skin: Warm & dry  Cardiovascular: Normal heart rate noted  Respiratory: Normal respiratory effort, no distress  Abdomen: Soft, gravid, nontender  Pelvic: Cervical exam deferred         Extremities: Edema: None  Fetal Status: Fetal Heart Rate (bpm): 137 Fundal Height: 41 cm Movement: Present      US MFM FETAL BPP WO NON STRESS (Accession [id number]) (Order [id number]) Narrative & Impression  ----------------------------------------------------------------------  OBSTETRICS REPORT                       (Signed Final 05/31/2020 12:47 pm) ---------------------------------------------------------------------- Patient Info  ID #:       [id number]                           D.O.B.:  01-Apr-1991 (29 yrs)  Name:       Brianna Lee                      Visit Date: 05/31/2020 11:14 am ---------------------------------------------------------------------- Performed By  Attending:        Ma RingsVictor Fang MD         [facility]  Performed By:     Emeline DarlingKasie E Kiser BS,      [facility]                                                             [facility] ---------------------------------------------------------------------- Orders  #  Description                           Code        Ordered By  1  [place] MFM FETAL BPP WO NON               [id number]    YU FANG     STRESS  2  [place] MFM UA CORD DOPPLER                [id number]    YU FANG ----------------------------------------------------------------------  #  Order #                     Accession #                Episode #  1  [id number]                   [id number]                 [id number]  2  [id number]                   [id number]                 [id number] ---------------------------------------------------------------------- Indications  Maternal care for known or suspected poor      O36.5930  fetal growth, third trimester, not applicable or  unspecified IUGR  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 Cord Insertion:      Previously Visualized  Amniotic Fluid  AFI FV:      Within normal limits  AFI Sum(cm)     %Tile       Largest Pocket(cm)  12.91            39          4.84  RUQ(cm)       RLQ(cm)       LUQ(cm)        LLQ(cm)  3.09          2.43          2.55           4.84 ---------------------------------------------------------------------- Biophysical Evaluation  Amniotic F.V:   Pocket => 2 cm             F. Tone:        Observed  F. Movement:    Observed                   Score:          8/8  F. Breathing:   Observed ---------------------------------------------------------------------- OB History  Gravidity:    2          SAB:   1 ----------------------------------------------------------------------  Gestational Age  LMP:           35w 6d        Date:  09/23/19                 EDD:   06/29/20  Best:          Armida Sans 3d     Det. By:  Previous Ultrasound      EDD:   07/23/20                                      (12/16/19) ---------------------------------------------------------------------- Anatomy  Diaphragm:             Appears normal         Bladder:                Appears normal  Stomach:               Appears normal, left                         sided ---------------------------------------------------------------------- Doppler - Fetal Vessels  Umbilical Artery   S/D     %tile      RI    %tile                      PSV    ADFV    RDFV                                                     (cm/s)   3.81       95    0.74       94                     42.34      No      No ---------------------------------------------------------------------- Comments  This patient was seen due to an IUGR fetus.  She denies any  problems since her last exam.  She reports feeling vigorous  fetal movements throughout the day.  The patient's blood  pressures in our office today were 138/97 and 141/75.  A biophysical profile performed today was 8 out of 8.  There was normal amniotic fluid noted on today's ultrasound  exam.  Doppler studies of the umbilical arteries performed due to  fetal growth restriction continue to show an elevated S/D ratio  of  3.81.  There were no signs of absent or reversed end-  diastolic flow noted today.  Preeclampsia precautions were reviewed today.  She was  advised to continue to monitor her blood pressures at home.  Another biophysical profile, umbilical artery Doppler study,  and growth ultrasound was scheduled in 1 week. ----------------------------------------------------------------------                   Ma Rings, MD Electronically Signed Final Report   05/31/2020 12:47 pm ----------------------------------------------------------------------     Assessment & Plan:  1) Low-risk pregnancy G2P0010 at [redacted]w[redacted]d with an Estimated Date of Delivery: 07/23/20   2) Supervision of other normal pregnancy, antepartum  3) Uterine size date discrepancy pregnancy, third trimester - Discussed large FH d/t obesity in pregnancy - Weekly U/S for obesity in pregnancy  4) Pregnancy affected by fetal growth restriction - Followed by MFM  5) Chronic hypertension affecting pregnancy - Taking bASA daily  6) [redacted] weeks gestation of pregnancy     Meds: No orders of the defined types were placed in this encounter.  Labs/procedures today: none  Plan:  Continue routine obstetrical care   Reviewed: Preterm labor symptoms and general obstetric precautions including but not limited to vaginal bleeding, contractions, leaking of fluid and fetal movement were reviewed in detail with the patient.  All questions were answered. Has home bp cuff. Check bp weekly, let us know if >140/90.   Follow-up: Return in about 2 weeks (around 06/16/2020) for Return OB visit.  No orders of the defined types were placed in this encounter.  Raelyn Mora MSN, CNM 06/02/2020 2:16 PM

## 2020-06-07 ENCOUNTER — Ambulatory Visit: Payer: Medicaid Other | Attending: Obstetrics

## 2020-06-07 ENCOUNTER — Ambulatory Visit: Payer: Medicaid Other | Admitting: *Deleted

## 2020-06-07 ENCOUNTER — Other Ambulatory Visit: Payer: Self-pay | Admitting: *Deleted

## 2020-06-07 ENCOUNTER — Encounter: Payer: Self-pay | Admitting: *Deleted

## 2020-06-07 ENCOUNTER — Other Ambulatory Visit: Payer: Self-pay

## 2020-06-07 DIAGNOSIS — O10013 Pre-existing essential hypertension complicating pregnancy, third trimester: Secondary | ICD-10-CM | POA: Diagnosis not present

## 2020-06-07 DIAGNOSIS — O23591 Infection of other part of genital tract in pregnancy, first trimester: Secondary | ICD-10-CM | POA: Insufficient documentation

## 2020-06-07 DIAGNOSIS — W19XXXA Unspecified fall, initial encounter: Secondary | ICD-10-CM

## 2020-06-07 DIAGNOSIS — T1490XA Injury, unspecified, initial encounter: Secondary | ICD-10-CM

## 2020-06-07 DIAGNOSIS — O9A213 Injury, poisoning and certain other consequences of external causes complicating pregnancy, third trimester: Secondary | ICD-10-CM | POA: Diagnosis not present

## 2020-06-07 DIAGNOSIS — Z3480 Encounter for supervision of other normal pregnancy, unspecified trimester: Secondary | ICD-10-CM | POA: Insufficient documentation

## 2020-06-07 DIAGNOSIS — A5901 Trichomonal vulvovaginitis: Secondary | ICD-10-CM | POA: Diagnosis present

## 2020-06-07 DIAGNOSIS — O10919 Unspecified pre-existing hypertension complicating pregnancy, unspecified trimester: Secondary | ICD-10-CM

## 2020-06-07 DIAGNOSIS — Z3A33 33 weeks gestation of pregnancy: Secondary | ICD-10-CM

## 2020-06-07 DIAGNOSIS — Z148 Genetic carrier of other disease: Secondary | ICD-10-CM | POA: Diagnosis not present

## 2020-06-07 DIAGNOSIS — O2693 Pregnancy related conditions, unspecified, third trimester: Secondary | ICD-10-CM

## 2020-06-07 DIAGNOSIS — O365930 Maternal care for other known or suspected poor fetal growth, third trimester, not applicable or unspecified: Secondary | ICD-10-CM

## 2020-06-14 ENCOUNTER — Ambulatory Visit: Payer: Medicaid Other | Attending: Obstetrics

## 2020-06-14 ENCOUNTER — Ambulatory Visit: Payer: Medicaid Other | Admitting: *Deleted

## 2020-06-14 ENCOUNTER — Encounter: Payer: Self-pay | Admitting: *Deleted

## 2020-06-14 ENCOUNTER — Other Ambulatory Visit: Payer: Self-pay

## 2020-06-14 DIAGNOSIS — Z148 Genetic carrier of other disease: Secondary | ICD-10-CM | POA: Diagnosis not present

## 2020-06-14 DIAGNOSIS — O10013 Pre-existing essential hypertension complicating pregnancy, third trimester: Secondary | ICD-10-CM | POA: Diagnosis not present

## 2020-06-14 DIAGNOSIS — Z3480 Encounter for supervision of other normal pregnancy, unspecified trimester: Secondary | ICD-10-CM | POA: Diagnosis present

## 2020-06-14 DIAGNOSIS — A5901 Trichomonal vulvovaginitis: Secondary | ICD-10-CM | POA: Insufficient documentation

## 2020-06-14 DIAGNOSIS — E669 Obesity, unspecified: Secondary | ICD-10-CM | POA: Diagnosis not present

## 2020-06-14 DIAGNOSIS — O365930 Maternal care for other known or suspected poor fetal growth, third trimester, not applicable or unspecified: Secondary | ICD-10-CM | POA: Diagnosis not present

## 2020-06-14 DIAGNOSIS — O23591 Infection of other part of genital tract in pregnancy, first trimester: Secondary | ICD-10-CM | POA: Insufficient documentation

## 2020-06-14 DIAGNOSIS — O10919 Unspecified pre-existing hypertension complicating pregnancy, unspecified trimester: Secondary | ICD-10-CM | POA: Diagnosis present

## 2020-06-14 DIAGNOSIS — O99213 Obesity complicating pregnancy, third trimester: Secondary | ICD-10-CM

## 2020-06-14 DIAGNOSIS — W19XXXA Unspecified fall, initial encounter: Secondary | ICD-10-CM

## 2020-06-14 DIAGNOSIS — O98513 Other viral diseases complicating pregnancy, third trimester: Secondary | ICD-10-CM | POA: Diagnosis not present

## 2020-06-14 DIAGNOSIS — O358XX0 Maternal care for other (suspected) fetal abnormality and damage, not applicable or unspecified: Secondary | ICD-10-CM | POA: Diagnosis not present

## 2020-06-14 DIAGNOSIS — O9A213 Injury, poisoning and certain other consequences of external causes complicating pregnancy, third trimester: Secondary | ICD-10-CM

## 2020-06-14 DIAGNOSIS — Z3A34 34 weeks gestation of pregnancy: Secondary | ICD-10-CM

## 2020-06-14 DIAGNOSIS — Z8616 Personal history of COVID-19: Secondary | ICD-10-CM

## 2020-06-14 DIAGNOSIS — T1490XA Injury, unspecified, initial encounter: Secondary | ICD-10-CM | POA: Diagnosis not present

## 2020-06-17 ENCOUNTER — Inpatient Hospital Stay (HOSPITAL_COMMUNITY)
Admission: AD | Admit: 2020-06-17 | Discharge: 2020-06-20 | DRG: 788 | Disposition: A | Discharge status: Home / Self Care | Payer: Medicaid Other | Attending: Obstetrics and Gynecology | Admitting: Obstetrics and Gynecology

## 2020-06-17 ENCOUNTER — Encounter (HOSPITAL_COMMUNITY): Payer: Self-pay | Admitting: Obstetrics & Gynecology

## 2020-06-17 ENCOUNTER — Ambulatory Visit (INDEPENDENT_AMBULATORY_CARE_PROVIDER_SITE_OTHER): Payer: Medicaid Other | Admitting: Obstetrics and Gynecology

## 2020-06-17 ENCOUNTER — Encounter (HOSPITAL_COMMUNITY)
Admission: AD | Disposition: A | Discharge status: Home / Self Care | Payer: Self-pay | Source: Home / Self Care | Attending: Obstetrics and Gynecology

## 2020-06-17 ENCOUNTER — Inpatient Hospital Stay (HOSPITAL_COMMUNITY): Payer: Medicaid Other | Admitting: Anesthesiology

## 2020-06-17 ENCOUNTER — Other Ambulatory Visit: Payer: Self-pay

## 2020-06-17 ENCOUNTER — Encounter: Payer: Self-pay | Admitting: Obstetrics and Gynecology

## 2020-06-17 VITALS — BP 178/137 | HR 103 | Temp 98.2°F | Wt 314.0 lb

## 2020-06-17 DIAGNOSIS — O99210 Obesity complicating pregnancy, unspecified trimester: Secondary | ICD-10-CM | POA: Diagnosis present

## 2020-06-17 DIAGNOSIS — O119 Pre-existing hypertension with pre-eclampsia, unspecified trimester: Secondary | ICD-10-CM | POA: Diagnosis present

## 2020-06-17 DIAGNOSIS — O0990 Supervision of high risk pregnancy, unspecified, unspecified trimester: Secondary | ICD-10-CM

## 2020-06-17 DIAGNOSIS — Z98891 History of uterine scar from previous surgery: Secondary | ICD-10-CM

## 2020-06-17 DIAGNOSIS — O328XX0 Maternal care for other malpresentation of fetus, not applicable or unspecified: Secondary | ICD-10-CM | POA: Diagnosis present

## 2020-06-17 DIAGNOSIS — D563 Thalassemia minor: Secondary | ICD-10-CM | POA: Diagnosis not present

## 2020-06-17 DIAGNOSIS — O365990 Maternal care for other known or suspected poor fetal growth, unspecified trimester, not applicable or unspecified: Secondary | ICD-10-CM

## 2020-06-17 DIAGNOSIS — A5901 Trichomonal vulvovaginitis: Secondary | ICD-10-CM | POA: Diagnosis present

## 2020-06-17 DIAGNOSIS — O1203 Gestational edema, third trimester: Secondary | ICD-10-CM

## 2020-06-17 DIAGNOSIS — Z3A34 34 weeks gestation of pregnancy: Secondary | ICD-10-CM

## 2020-06-17 DIAGNOSIS — E669 Obesity, unspecified: Secondary | ICD-10-CM | POA: Diagnosis not present

## 2020-06-17 DIAGNOSIS — O114 Pre-existing hypertension with pre-eclampsia, complicating childbirth: Secondary | ICD-10-CM | POA: Diagnosis not present

## 2020-06-17 DIAGNOSIS — O1002 Pre-existing essential hypertension complicating childbirth: Secondary | ICD-10-CM | POA: Diagnosis not present

## 2020-06-17 DIAGNOSIS — O10919 Unspecified pre-existing hypertension complicating pregnancy, unspecified trimester: Secondary | ICD-10-CM

## 2020-06-17 DIAGNOSIS — Z30430 Encounter for insertion of intrauterine contraceptive device: Secondary | ICD-10-CM | POA: Diagnosis not present

## 2020-06-17 DIAGNOSIS — O99214 Obesity complicating childbirth: Secondary | ICD-10-CM | POA: Diagnosis not present

## 2020-06-17 DIAGNOSIS — Z148 Genetic carrier of other disease: Secondary | ICD-10-CM

## 2020-06-17 DIAGNOSIS — O1092 Unspecified pre-existing hypertension complicating childbirth: Secondary | ICD-10-CM | POA: Diagnosis not present

## 2020-06-17 DIAGNOSIS — O1414 Severe pre-eclampsia complicating childbirth: Secondary | ICD-10-CM | POA: Diagnosis present

## 2020-06-17 DIAGNOSIS — O365930 Maternal care for other known or suspected poor fetal growth, third trimester, not applicable or unspecified: Secondary | ICD-10-CM | POA: Diagnosis not present

## 2020-06-17 DIAGNOSIS — Z8759 Personal history of other complications of pregnancy, childbirth and the puerperium: Secondary | ICD-10-CM | POA: Diagnosis present

## 2020-06-17 DIAGNOSIS — Z87891 Personal history of nicotine dependence: Secondary | ICD-10-CM

## 2020-06-17 DIAGNOSIS — Z8616 Personal history of COVID-19: Secondary | ICD-10-CM

## 2020-06-17 DIAGNOSIS — O321XX0 Maternal care for breech presentation, not applicable or unspecified: Secondary | ICD-10-CM | POA: Diagnosis not present

## 2020-06-17 HISTORY — DX: Maternal care for other known or suspected poor fetal growth, unspecified trimester, not applicable or unspecified: O36.5990

## 2020-06-17 LAB — CBC
HCT: 43.9 % (ref 36.0–46.0)
Hemoglobin: 14.3 g/dL (ref 12.0–15.0)
MCH: 27.3 pg (ref 26.0–34.0)
MCHC: 32.6 g/dL (ref 30.0–36.0)
MCV: 83.8 fL (ref 80.0–100.0)
Platelets: 318 10*3/uL (ref 150–400)
RBC: 5.24 MIL/uL — ABNORMAL HIGH (ref 3.87–5.11)
RDW: 14.1 % (ref 11.5–15.5)
WBC: 13.7 10*3/uL — ABNORMAL HIGH (ref 4.0–10.5)
nRBC: 0.1 % (ref 0.0–0.2)

## 2020-06-17 LAB — COMPREHENSIVE METABOLIC PANEL
ALT: 26 U/L (ref 0–44)
AST: 27 U/L (ref 15–41)
Albumin: 1.9 g/dL — ABNORMAL LOW (ref 3.5–5.0)
Alkaline Phosphatase: 147 U/L — ABNORMAL HIGH (ref 38–126)
Anion gap: 9 (ref 5–15)
BUN: 8 mg/dL (ref 6–20)
CO2: 21 mmol/L — ABNORMAL LOW (ref 22–32)
Calcium: 9 mg/dL (ref 8.9–10.3)
Chloride: 107 mmol/L (ref 98–111)
Creatinine, Ser: 0.7 mg/dL (ref 0.44–1.00)
GFR, Estimated: >60 mL/min (ref >60)
Glucose, Bld: 73 mg/dL (ref 70–99)
Potassium: 4.2 mmol/L (ref 3.5–5.1)
Sodium: 137 mmol/L (ref 135–145)
Total Bilirubin: 0.5 mg/dL (ref 0.3–1.2)
Total Protein: 6.1 g/dL — ABNORMAL LOW (ref 6.5–8.1)

## 2020-06-17 LAB — URINALYSIS, ROUTINE W REFLEX MICROSCOPIC
Bilirubin Urine: NEGATIVE
Glucose, UA: NEGATIVE mg/dL
Hgb urine dipstick: NEGATIVE
Ketones, ur: NEGATIVE mg/dL
Leukocytes,Ua: NEGATIVE
Nitrite: NEGATIVE
Protein, ur: >=300 mg/dL — AB
Specific Gravity, Urine: 1.017 (ref 1.005–1.030)
pH: 6 (ref 5.0–8.0)

## 2020-06-17 LAB — PROTEIN / CREATININE RATIO, URINE
Creatinine, Urine: 137.86 mg/dL
Protein Creatinine Ratio: 4.58 mg/mg{Cre} — ABNORMAL HIGH (ref 0.00–0.15)
Total Protein, Urine: 631 mg/dL

## 2020-06-17 LAB — TYPE AND SCREEN
ABO/RH(D): O POS
Antibody Screen: NEGATIVE

## 2020-06-17 SURGERY — Surgical Case
Anesthesia: Spinal

## 2020-06-17 MED ORDER — ACETAMINOPHEN 500 MG PO TABS
1000.0000 mg | ORAL_TABLET | Freq: Four times a day (QID) | ORAL | Status: AC
  Administered 2020-06-17 – 2020-06-19 (×7): 1000 mg via ORAL
  Filled 2020-06-17 (×8): qty 2

## 2020-06-17 MED ORDER — DEXAMETHASONE SODIUM PHOSPHATE 10 MG/ML IJ SOLN
INTRAMUSCULAR | Status: DC | PRN
  Administered 2020-06-17: 10 mg via INTRAVENOUS

## 2020-06-17 MED ORDER — FERROUS SULFATE 325 (65 FE) MG PO TABS
325.0000 mg | ORAL_TABLET | Freq: Two times a day (BID) | ORAL | Status: DC
  Administered 2020-06-18 – 2020-06-20 (×5): 325 mg via ORAL
  Filled 2020-06-17 (×5): qty 1

## 2020-06-17 MED ORDER — TRAMADOL HCL 50 MG PO TABS: 50.0000 mg | ORAL_TABLET | Freq: Four times a day (QID) | ORAL | Status: DC | PRN

## 2020-06-17 MED ORDER — OXYTOCIN-SODIUM CHLORIDE 30-0.9 UT/500ML-% IV SOLN
INTRAVENOUS | Status: AC
  Filled 2020-06-17: qty 500

## 2020-06-17 MED ORDER — WITCH HAZEL-GLYCERIN EX PADS: 1.0000 "application " | MEDICATED_PAD | CUTANEOUS | Status: DC | PRN

## 2020-06-17 MED ORDER — DIPHENHYDRAMINE HCL 25 MG PO CAPS: 25.0000 mg | ORAL_CAPSULE | Freq: Four times a day (QID) | ORAL | Status: DC | PRN

## 2020-06-17 MED ORDER — HYDROMORPHONE HCL 1 MG/ML IJ SOLN
INTRAMUSCULAR | Status: AC
  Filled 2020-06-17: qty 0.5

## 2020-06-17 MED ORDER — LACTATED RINGERS IV SOLN: INTRAVENOUS | Status: DC

## 2020-06-17 MED ORDER — OXYTOCIN-SODIUM CHLORIDE 30-0.9 UT/500ML-% IV SOLN: 2.5000 [IU]/h | INTRAVENOUS | Status: AC

## 2020-06-17 MED ORDER — ENOXAPARIN SODIUM 80 MG/0.8ML ~~LOC~~ SOLN
70.0000 mg | SUBCUTANEOUS | Status: DC
  Administered 2020-06-18 – 2020-06-19 (×2): 70 mg via SUBCUTANEOUS
  Filled 2020-06-17 (×2): qty 0.8

## 2020-06-17 MED ORDER — ZOLPIDEM TARTRATE 5 MG PO TABS: 5.0000 mg | ORAL_TABLET | Freq: Every evening | ORAL | Status: DC | PRN

## 2020-06-17 MED ORDER — LABETALOL HCL 5 MG/ML IV SOLN
80.0000 mg | INTRAVENOUS | Status: DC | PRN
  Administered 2020-06-17: 80 mg via INTRAVENOUS
  Filled 2020-06-17: qty 16

## 2020-06-17 MED ORDER — HYDRALAZINE HCL 20 MG/ML IJ SOLN: 10.0000 mg | INTRAMUSCULAR | Status: DC | PRN

## 2020-06-17 MED ORDER — MORPHINE SULFATE (PF) 0.5 MG/ML IJ SOLN
INTRAMUSCULAR | Status: DC | PRN
  Administered 2020-06-17: 150 ug via INTRATHECAL

## 2020-06-17 MED ORDER — ONDANSETRON HCL 4 MG/2ML IJ SOLN
4.0000 mg | Freq: Four times a day (QID) | INTRAMUSCULAR | Status: DC | PRN
  Administered 2020-06-17: 4 mg via INTRAVENOUS
  Filled 2020-06-17: qty 2

## 2020-06-17 MED ORDER — PHENYLEPHRINE 40 MCG/ML (10ML) SYRINGE FOR IV PUSH (FOR BLOOD PRESSURE SUPPORT)
PREFILLED_SYRINGE | INTRAVENOUS | Status: AC
  Filled 2020-06-17: qty 10

## 2020-06-17 MED ORDER — BETAMETHASONE SOD PHOS & ACET 6 (3-3) MG/ML IJ SUSP
12.0000 mg | INTRAMUSCULAR | Status: DC
  Administered 2020-06-17: 12 mg via INTRAMUSCULAR
  Filled 2020-06-17: qty 5

## 2020-06-17 MED ORDER — POVIDONE-IODINE 10 % EX SWAB
2.0000 "application " | Freq: Once | CUTANEOUS | Status: AC
  Administered 2020-06-17: 2 via TOPICAL

## 2020-06-17 MED ORDER — MENTHOL 3 MG MT LOZG: 1.0000 | LOZENGE | OROMUCOSAL | Status: DC | PRN

## 2020-06-17 MED ORDER — PHENYLEPHRINE HCL-NACL 20-0.9 MG/250ML-% IV SOLN
INTRAVENOUS | Status: DC | PRN
  Administered 2020-06-17: 30 ug/min via INTRAVENOUS

## 2020-06-17 MED ORDER — SIMETHICONE 80 MG PO CHEW: 80.0000 mg | CHEWABLE_TABLET | ORAL | Status: DC | PRN

## 2020-06-17 MED ORDER — ONDANSETRON HCL 4 MG/2ML IJ SOLN
INTRAMUSCULAR | Status: DC | PRN
  Administered 2020-06-17: 4 mg via INTRAVENOUS

## 2020-06-17 MED ORDER — LABETALOL HCL 5 MG/ML IV SOLN: 80.0000 mg | INTRAVENOUS | Status: DC | PRN

## 2020-06-17 MED ORDER — DIBUCAINE (PERIANAL) 1 % EX OINT: 1.0000 "application " | TOPICAL_OINTMENT | CUTANEOUS | Status: DC | PRN

## 2020-06-17 MED ORDER — ONDANSETRON HCL 4 MG/2ML IJ SOLN: 4.0000 mg | Freq: Four times a day (QID) | INTRAMUSCULAR | Status: DC | PRN

## 2020-06-17 MED ORDER — ONDANSETRON HCL 4 MG/2ML IJ SOLN: 4.0000 mg | Freq: Once | INTRAMUSCULAR | Status: DC | PRN

## 2020-06-17 MED ORDER — LABETALOL HCL 5 MG/ML IV SOLN
40.0000 mg | INTRAVENOUS | Status: DC | PRN
  Administered 2020-06-17: 40 mg via INTRAVENOUS
  Filled 2020-06-17: qty 8

## 2020-06-17 MED ORDER — SOD CITRATE-CITRIC ACID 500-334 MG/5ML PO SOLN
30.0000 mL | ORAL | Status: AC
  Administered 2020-06-17: 30 mL via ORAL

## 2020-06-17 MED ORDER — SOD CITRATE-CITRIC ACID 500-334 MG/5ML PO SOLN
30.0000 mL | ORAL | Status: DC | PRN
  Filled 2020-06-17 (×2): qty 15

## 2020-06-17 MED ORDER — LABETALOL HCL 5 MG/ML IV SOLN: 40.0000 mg | INTRAVENOUS | Status: DC | PRN

## 2020-06-17 MED ORDER — MEASLES, MUMPS & RUBELLA VAC IJ SOLR: 0.5000 mL | Freq: Once | INTRAMUSCULAR | Status: DC

## 2020-06-17 MED ORDER — DEXAMETHASONE SODIUM PHOSPHATE 10 MG/ML IJ SOLN
INTRAMUSCULAR | Status: AC
  Filled 2020-06-17: qty 1

## 2020-06-17 MED ORDER — OXYTOCIN BOLUS FROM INFUSION: 333.0000 mL | Freq: Once | INTRAVENOUS | Status: DC

## 2020-06-17 MED ORDER — GABAPENTIN 300 MG PO CAPS
300.0000 mg | ORAL_CAPSULE | Freq: Two times a day (BID) | ORAL | Status: DC
  Administered 2020-06-17 – 2020-06-20 (×6): 300 mg via ORAL
  Filled 2020-06-17 (×6): qty 1

## 2020-06-17 MED ORDER — OXYCODONE-ACETAMINOPHEN 5-325 MG PO TABS
2.0000 | ORAL_TABLET | ORAL | Status: DC | PRN
  Administered 2020-06-19 – 2020-06-20 (×2): 2 via ORAL
  Filled 2020-06-17 (×2): qty 2

## 2020-06-17 MED ORDER — LACTATED RINGERS IV SOLN: 500.0000 mL | INTRAVENOUS | Status: DC | PRN

## 2020-06-17 MED ORDER — FENTANYL CITRATE (PF) 100 MCG/2ML IJ SOLN
INTRAMUSCULAR | Status: AC
  Filled 2020-06-17: qty 2

## 2020-06-17 MED ORDER — OXYCODONE HCL 5 MG PO TABS: 5.0000 mg | ORAL_TABLET | Freq: Once | ORAL | Status: DC | PRN

## 2020-06-17 MED ORDER — LABETALOL HCL 5 MG/ML IV SOLN
INTRAVENOUS | Status: AC
  Filled 2020-06-17: qty 4

## 2020-06-17 MED ORDER — DEXTROSE 5 % IV SOLN: 3.0000 g | INTRAVENOUS | Status: DC

## 2020-06-17 MED ORDER — PHENYLEPHRINE HCL-NACL 20-0.9 MG/250ML-% IV SOLN
INTRAVENOUS | Status: AC
  Filled 2020-06-17: qty 250

## 2020-06-17 MED ORDER — LEVONORGESTREL 19.5 MCG/DAY IU IUD
INTRAUTERINE_SYSTEM | INTRAUTERINE | Status: AC
  Filled 2020-06-17: qty 1

## 2020-06-17 MED ORDER — PRENATAL MULTIVITAMIN CH
1.0000 | ORAL_TABLET | Freq: Every day | ORAL | Status: DC
  Administered 2020-06-18 – 2020-06-20 (×3): 1 via ORAL
  Filled 2020-06-17 (×3): qty 1

## 2020-06-17 MED ORDER — DEXTROSE 5 % IV SOLN
INTRAVENOUS | Status: DC | PRN
  Administered 2020-06-17: 3 g via INTRAVENOUS

## 2020-06-17 MED ORDER — CEFAZOLIN (ANCEF) 1 G IV SOLR: 3.0000 g | INTRAVENOUS | Status: DC

## 2020-06-17 MED ORDER — PHENYLEPHRINE HCL (PRESSORS) 10 MG/ML IV SOLN
INTRAVENOUS | Status: DC | PRN
  Administered 2020-06-17: 120 ug via INTRAVENOUS

## 2020-06-17 MED ORDER — TRANEXAMIC ACID-NACL 1000-0.7 MG/100ML-% IV SOLN
INTRAVENOUS | Status: AC
  Filled 2020-06-17: qty 100

## 2020-06-17 MED ORDER — MAGNESIUM SULFATE 40 GM/1000ML IV SOLN
2.0000 g/h | INTRAVENOUS | Status: AC
  Administered 2020-06-18: 2 g/h via INTRAVENOUS
  Filled 2020-06-17: qty 1000

## 2020-06-17 MED ORDER — ACETAMINOPHEN 325 MG PO TABS: 650.0000 mg | ORAL_TABLET | ORAL | Status: DC | PRN

## 2020-06-17 MED ORDER — LIDOCAINE HCL (PF) 1 % IJ SOLN: 30.0000 mL | INTRAMUSCULAR | Status: DC | PRN

## 2020-06-17 MED ORDER — HYDROMORPHONE HCL 1 MG/ML IJ SOLN
0.2500 mg | INTRAMUSCULAR | Status: DC | PRN
Start: 2020-06-17 — End: 2020-06-17
  Administered 2020-06-17: 0.5 mg via INTRAVENOUS

## 2020-06-17 MED ORDER — MAGNESIUM HYDROXIDE 400 MG/5ML PO SUSP: 30.0000 mL | ORAL | Status: DC | PRN

## 2020-06-17 MED ORDER — OXYTOCIN-SODIUM CHLORIDE 30-0.9 UT/500ML-% IV SOLN
INTRAVENOUS | Status: DC | PRN
  Administered 2020-06-17: 300 mL via INTRAVENOUS

## 2020-06-17 MED ORDER — KETOROLAC TROMETHAMINE 30 MG/ML IJ SOLN: 30.0000 mg | Freq: Once | INTRAMUSCULAR | Status: DC | PRN

## 2020-06-17 MED ORDER — MORPHINE SULFATE (PF) 0.5 MG/ML IJ SOLN
INTRAMUSCULAR | Status: AC
  Filled 2020-06-17: qty 10

## 2020-06-17 MED ORDER — LABETALOL HCL 5 MG/ML IV SOLN
20.0000 mg | INTRAVENOUS | Status: DC | PRN
  Administered 2020-06-17: 20 mg via INTRAVENOUS
  Filled 2020-06-17: qty 4

## 2020-06-17 MED ORDER — LABETALOL HCL 5 MG/ML IV SOLN: 20.0000 mg | INTRAVENOUS | Status: DC | PRN

## 2020-06-17 MED ORDER — FENTANYL CITRATE (PF) 100 MCG/2ML IJ SOLN: 50.0000 ug | INTRAMUSCULAR | Status: DC | PRN

## 2020-06-17 MED ORDER — BUPIVACAINE IN DEXTROSE 0.75-8.25 % IT SOLN
INTRATHECAL | Status: DC | PRN
  Administered 2020-06-17: 12 mg via INTRATHECAL

## 2020-06-17 MED ORDER — SODIUM CHLORIDE 0.9 % IV SOLN: 5.0000 10*6.[IU] | Freq: Once | INTRAVENOUS | Status: DC

## 2020-06-17 MED ORDER — NIFEDIPINE ER OSMOTIC RELEASE 30 MG PO TB24
30.0000 mg | ORAL_TABLET | Freq: Every day | ORAL | Status: DC
  Administered 2020-06-17 – 2020-06-20 (×4): 30 mg via ORAL
  Filled 2020-06-17 (×4): qty 1

## 2020-06-17 MED ORDER — COCONUT OIL OIL: 1.0000 "application " | TOPICAL_OIL | Status: DC | PRN

## 2020-06-17 MED ORDER — PENICILLIN G POT IN DEXTROSE 60000 UNIT/ML IV SOLN
3.0000 10*6.[IU] | INTRAVENOUS | Status: DC
  Filled 2020-06-17 (×2): qty 50

## 2020-06-17 MED ORDER — LEVONORGESTREL 19.5 MCG/DAY IU IUD
INTRAUTERINE_SYSTEM | Freq: Once | INTRAUTERINE | Status: AC
  Administered 2020-06-17: 1 via INTRAUTERINE
  Filled 2020-06-17: qty 1

## 2020-06-17 MED ORDER — TRANEXAMIC ACID-NACL 1000-0.7 MG/100ML-% IV SOLN
1000.0000 mg | INTRAVENOUS | Status: AC
  Administered 2020-06-17: 1000 mg via INTRAVENOUS

## 2020-06-17 MED ORDER — OXYCODONE HCL 5 MG/5ML PO SOLN: 5.0000 mg | Freq: Once | ORAL | Status: DC | PRN

## 2020-06-17 MED ORDER — OXYCODONE HCL 5 MG PO TABS
5.0000 mg | ORAL_TABLET | ORAL | Status: DC | PRN
Start: 2020-06-17 — End: 2020-06-20

## 2020-06-17 MED ORDER — SENNOSIDES-DOCUSATE SODIUM 8.6-50 MG PO TABS
2.0000 | ORAL_TABLET | Freq: Every day | ORAL | Status: DC
  Administered 2020-06-18 – 2020-06-20 (×3): 2 via ORAL
  Filled 2020-06-17 (×3): qty 2

## 2020-06-17 MED ORDER — OXYTOCIN-SODIUM CHLORIDE 30-0.9 UT/500ML-% IV SOLN: 2.5000 [IU]/h | INTRAVENOUS | Status: DC

## 2020-06-17 MED ORDER — FAMOTIDINE IN NACL 20-0.9 MG/50ML-% IV SOLN
20.0000 mg | Freq: Once | INTRAVENOUS | Status: AC
  Administered 2020-06-17: 20 mg via INTRAVENOUS
  Filled 2020-06-17: qty 50

## 2020-06-17 MED ORDER — LABETALOL HCL 5 MG/ML IV SOLN
20.0000 mg | Freq: Once | INTRAVENOUS | Status: AC
  Administered 2020-06-17: 20 mg via INTRAVENOUS

## 2020-06-17 MED ORDER — TETANUS-DIPHTH-ACELL PERTUSSIS 5-2.5-18.5 LF-MCG/0.5 IM SUSY: 0.5000 mL | PREFILLED_SYRINGE | Freq: Once | INTRAMUSCULAR | Status: DC

## 2020-06-17 MED ORDER — FENTANYL CITRATE (PF) 100 MCG/2ML IJ SOLN
INTRAMUSCULAR | Status: DC | PRN
  Administered 2020-06-17: 15 ug via INTRATHECAL

## 2020-06-17 MED ORDER — MAGNESIUM SULFATE BOLUS VIA INFUSION
4.0000 g | Freq: Once | INTRAVENOUS | Status: AC
  Administered 2020-06-17: 4 g via INTRAVENOUS
  Filled 2020-06-17: qty 1000

## 2020-06-17 MED ORDER — HYDROMORPHONE HCL 1 MG/ML IJ SOLN
1.0000 mg | INTRAMUSCULAR | Status: DC | PRN
Start: 2020-06-17 — End: 2020-06-20

## 2020-06-17 MED ORDER — DEXTROSE 5 % IV SOLN
3.0000 g | Freq: Once | INTRAVENOUS | Status: DC
  Filled 2020-06-17 (×2): qty 3000

## 2020-06-17 MED ORDER — MAGNESIUM SULFATE 40 GM/1000ML IV SOLN
2.0000 g/h | INTRAVENOUS | Status: DC
  Filled 2020-06-17: qty 1000

## 2020-06-17 MED ORDER — DEXTROSE 5 % IV SOLN
INTRAVENOUS | Status: AC
  Filled 2020-06-17: qty 3000

## 2020-06-17 SURGICAL SUPPLY — 36 items
CANISTER PREVENA PLUS 150 (CANNISTER) ×2 IMPLANT
CHLORAPREP W/TINT 26ML (MISCELLANEOUS) ×2 IMPLANT
CLAMP CORD UMBIL (MISCELLANEOUS) IMPLANT
CLOTH BEACON ORANGE TIMEOUT ST (SAFETY) ×2 IMPLANT
DRESSING PREVENA PLUS CUSTOM (GAUZE/BANDAGES/DRESSINGS) ×1 IMPLANT
DRSG OPSITE POSTOP 4X10 (GAUZE/BANDAGES/DRESSINGS) ×2 IMPLANT
DRSG PREVENA PLUS CUSTOM (GAUZE/BANDAGES/DRESSINGS) ×2
ELECT REM PT RETURN 9FT ADLT (ELECTROSURGICAL) ×2
ELECTRODE REM PT RTRN 9FT ADLT (ELECTROSURGICAL) ×1 IMPLANT
EXTRACTOR VACUUM M CUP 4 TUBE (SUCTIONS) IMPLANT
GLOVE BIOGEL PI IND STRL 7.0 (GLOVE) ×3 IMPLANT
GLOVE BIOGEL PI INDICATOR 7.0 (GLOVE) ×3
GLOVE ECLIPSE 7.0 STRL STRAW (GLOVE) ×2 IMPLANT
GOWN STRL REUS W/TWL LRG LVL3 (GOWN DISPOSABLE) ×4 IMPLANT
KIT ABG SYR 3ML LUER SLIP (SYRINGE) IMPLANT
MAT PREVALON FULL STRYKER (MISCELLANEOUS) ×2 IMPLANT
NEEDLE HYPO 22GX1.5 SAFETY (NEEDLE) ×2 IMPLANT
NEEDLE HYPO 25X5/8 SAFETYGLIDE (NEEDLE) ×2 IMPLANT
NS IRRIG 1000ML POUR BTL (IV SOLUTION) ×2 IMPLANT
PACK C SECTION WH (CUSTOM PROCEDURE TRAY) ×2 IMPLANT
PAD ABD 7.5X8 STRL (GAUZE/BANDAGES/DRESSINGS) ×2 IMPLANT
PAD OB MATERNITY 4.3X12.25 (PERSONAL CARE ITEMS) ×2 IMPLANT
PENCIL SMOKE EVAC W/HOLSTER (ELECTROSURGICAL) ×2 IMPLANT
RETRACTOR TRAXI PANNICULUS (MISCELLANEOUS) ×1 IMPLANT
RTRCTR C-SECT PINK 25CM LRG (MISCELLANEOUS) IMPLANT
SUT PDS AB 0 CTX 36 PDP370T (SUTURE) IMPLANT
SUT PDS AB 0 CTX 60 (SUTURE) ×2 IMPLANT
SUT PLAIN 2 0 XLH (SUTURE) IMPLANT
SUT VIC AB 0 CTX 36 (SUTURE) ×2
SUT VIC AB 0 CTX36XBRD ANBCTRL (SUTURE) ×2 IMPLANT
SUT VIC AB 4-0 KS 27 (SUTURE) ×4 IMPLANT
SYR CONTROL 10ML LL (SYRINGE) ×2 IMPLANT
TOWEL OR 17X24 6PK STRL BLUE (TOWEL DISPOSABLE) ×2 IMPLANT
TRAXI PANNICULUS RETRACTOR (MISCELLANEOUS) ×1
TRAY FOLEY W/BAG SLVR 14FR LF (SET/KITS/TRAYS/PACK) ×2 IMPLANT
WATER STERILE IRR 1000ML POUR (IV SOLUTION) ×2 IMPLANT

## 2020-06-17 NOTE — Anesthesia Procedure Notes (Signed)
Spinal  Patient location during procedure: OB Start time: 06/17/2020 3:05 PM End time: 06/17/2020 3:09 PM Staffing Performed: anesthesiologist  Anesthesiologist: Trevor Iha, MD Preanesthetic Checklist Completed: patient identified, IV checked, risks and benefits discussed, surgical consent, monitors and equipment checked, pre-op evaluation and timeout performed Spinal Block Patient position: sitting Prep: DuraPrep and site prepped and draped Patient monitoring: heart rate, cardiac monitor, continuous pulse ox and blood pressure Approach: midline Location: L3-4 Injection technique: single-shot Needle Needle type: Pencan  Needle gauge: 24 G Needle length: 10 cm Needle insertion depth: 10 cm Assessment Sensory level: T4 Additional Notes 1 Attempt (s). Spinal performed by S/P Pt tolerated procedure well.

## 2020-06-17 NOTE — Transfer of Care (Signed)
Immediate Anesthesia Transfer of Care Note  Patient: Brianna Lee  Procedure(s) Performed: CESAREAN SECTION (N/A )  Patient Location: PACU  Anesthesia Type:Spinal  Level of Consciousness: awake, alert  and oriented  Airway & Oxygen Therapy: Patient Spontanous Breathing  Post-op Assessment: Report given to RN and Post -op Vital signs reviewed and stable  Post vital signs: Reviewed and stable  Last Vitals:  Vitals Value Taken Time  BP 130/81 06/17/20 1627  Temp    Pulse 95 06/17/20 1629  Resp 18 06/17/20 1629  SpO2 98 % 06/17/20 1629  Vitals shown include unvalidated device data.  Last Pain:  Vitals:   06/17/20 1133  PainSc: 8          Complications: No complications documented.

## 2020-06-17 NOTE — MAU Note (Signed)
Patient sent from office for severe range Bps.  Patient states she takes her BP every morning, usually systolics around 130s-140s.  Patient reports she has intermittent headaches that are well relieved by tylenol.  No current HA.  Denies visual disturbances.  Some epigastric pain and swelling in her hands/feet.

## 2020-06-17 NOTE — Op Note (Signed)
Brianna Lee PROCEDURE DATE: 06/17/2020  PREOPERATIVE DIAGNOSES: Intrauterine pregnancy at [redacted]w[redacted]d weeks gestation; severe preeclampsia superimposed on chronic hypertension; severe intrauterine growth restriction; breech presentation; morbid obesity; desires long term reversible contraception  POSTOPERATIVE DIAGNOSES: The same  PROCEDURE: Primary Low Transverse Cesarean Section and Intrauterine Device Placement  SURGEON:  Dr. Jaynie Collins  ASSISTANT:  Dr. Mart Piggs  ANESTHESIOLOGY TEAM: Anesthesiologist: Trevor Iha, MD CRNA: Junious Silk, CRNA  INDICATIONS: Brianna Lee is a 30 y.o. G2P0010 at [redacted]w[redacted]d here for surgery secondary to the indications listed under preoperative diagnoses; please see preoperative note for further details.  The risks of surgery were discussed with the patient including but were not limited to: bleeding which may require transfusion or reoperation; infection which may require antibiotics; injury to bowel, bladder, ureters or other surrounding organs; injury to the fetus; need for additional procedures including hysterectomy in the event of a life-threatening hemorrhage; formation of adhesions; placental abnormalities wth subsequent pregnancies; incisional problems; thromboembolic phenomenon and other postoperative/anesthesia complications.   For the postplacental IUD placement, discussed risks of postpartum expulsion, irregular bleeding, cramping, infection, malpositioning or misplacement of the IUD which may require further procedures. Also discussed >99% contraception efficacy, increased risk of ectopic pregnancy with failure of method.   The patient concurred with the proposed plan, giving informed written consent for the procedures.      FINDINGS:  Viable female infant in footling breech presentation. Apgars and arterial cord pH values pending at time of this note, baby appeared to be stable and was take to NICU bu Neonatology team.  Clear amniotic fluid.   Intact placenta, three vessel cord.  Normal uterus, fallopian tubes and ovaries bilaterally. Liletta IUD placed into fundal cavity after delivery of placenta.  Prevena negative pressure wound dressing was placed and the end of the procedure.  ANESTHESIA: Spinal ESTIMATED BLOOD LOSS: 250 ml SPECIMENS: Placenta sent to pathology COMPLICATIONS: None immediate  PROCEDURE IN DETAIL:  The patient preoperatively received intravenous antibiotics and had sequential compression devices applied to her lower extremities.  She was then taken to the operating room where spinal anesthesia was administered and was found to be adequate. She was then placed in a dorsal supine position with a leftward tilt, and prepped and draped in a sterile manner.  A foley catheter was placed into her bladder and attached to constant gravity.  After an adequate timeout was performed, a Pfannenstiel skin incision was made with scalpel and carried through to the underlying layer of fascia. The fascia was incised in the midline, and this incision was extended bilaterally using the Mayo scissors.  Kocher clamps were applied to the superior aspect of the fascial incision and the underlying rectus muscles were dissected off bluntly and sharply.  A similar process was carried out on the inferior aspect of the fascial incision. The rectus muscles were separated in the midline and the peritoneum was entered bluntly. The Alexis self-retaining retractor was introduced into the abdominal cavity.  Attention was turned to the lower uterine segment where a low transverse hysterotomy was made with a scalpel and extended bilaterally bluntly.  The infant was successfully delivered, the cord was clamped and cut after one minute, and the infant was handed over to the awaiting neonatology team. Uterine massage was then administered, and the placenta delivered intact with a three-vessel cord. The uterus was then cleared of clots and debris.  The Liletta IUD was  placed in the fundal region, and the strings were pushed through the lower uterine segment  into cervix and upper vagina.  The hysterotomy was closed with 0 Vicryl in a running locked fashion, and an imbricating layer was also placed with 0 Vicryl.   The pelvis was cleared of all clot and debris. Hemostasis was confirmed on all surfaces.  The retractor was removed.  The peritoneum and rectus muscles were reapproximated using 0 Vicryl interrupted stitches. The fascia was then closed using 0 PDS in a running fashion.  The subcutaneous layer was irrigated, reapproximated with 2-0 plain gut interrupted stitches, and the skin was closed with a 4-0 Vicryl subcuticular stitch. After the skin was closed, a Prevena disposable negative pressure wound therapy device was placed over the incision.  The suction was activated at a pressure of -125 mmHg.  The adhesive was affixed well and there were no leaks noted.  The patient tolerated the procedure well. Sponge, instrument and needle counts were correct x 3.  She was taken to the recovery room in stable condition.     Jaynie Collins, MD, FACOG Obstetrician & Gynecologist, [facility] for [employer], [facility]

## 2020-06-17 NOTE — Progress Notes (Addendum)
Toco to be replaced secondary not capturing uterine activity from [phone]

## 2020-06-17 NOTE — Anesthesia Preprocedure Evaluation (Addendum)
Anesthesia Evaluation  Patient identified by MRN, date of birth, ID band Patient awake    Reviewed: Allergy & Precautions, NPO status , Patient's Chart, lab work & pertinent test results  Airway Mallampati: III  TM Distance: >3 FB Neck ROM: Full    Dental no notable dental hx. (+) Teeth Intact, Dental Advisory Given   Pulmonary former smoker,    Pulmonary exam normal breath sounds clear to auscultation       Cardiovascular hypertension (PIH), Pt. on medications Normal cardiovascular exam Rhythm:Regular Rate:Normal     Neuro/Psych negative neurological ROS  negative psych ROS   GI/Hepatic negative GI ROS, Neg liver ROS,   Endo/Other  negative endocrine ROS  Renal/GU negative Renal ROS     Musculoskeletal negative musculoskeletal ROS (+)   Abdominal (+) + obese,   Peds  Hematology Lab Results      Component                Value               Date                      WBC                      13.7 (H)            06/17/2020                HGB                      14.3                06/17/2020                HCT                      43.9                06/17/2020                MCV                      83.8                06/17/2020                PLT                      318                 06/17/2020              Anesthesia Other Findings   Reproductive/Obstetrics (+) Pregnancy                            Anesthesia Physical Anesthesia Plan  ASA: III  Anesthesia Plan: Spinal   Post-op Pain Management:    Induction: Intravenous  PONV Risk Score and Plan: 3 and Treatment may vary due to age or medical condition, Ondansetron and Dexamethasone  Airway Management Planned: Natural Airway and Nasal Cannula  Additional Equipment: None  Intra-op Plan:   Post-operative Plan:   Informed Consent: I have reviewed the patients History and Physical, chart, labs and discussed the procedure  including the risks, benefits and alternatives for the proposed anesthesia with the patient or authorized  representative who has indicated his/her understanding and acceptance.     Dental advisory given  Plan Discussed with:   Anesthesia Plan Comments: (34.6 wk G2P0 w Pre E on Chronic HTN and IUGR for c/s under spinal)        Anesthesia Quick Evaluation

## 2020-06-17 NOTE — MAU Provider Note (Signed)
History   CSN: [id number]  Arrival date and time: 06/17/20 1123   Chief Complaint  Patient presents with  . Hypertension   Brianna Lee is a 30 y/o female presenting G2P0(010) at 34 weeks 6 days with established IUGR and chronic HTN. The patient presented to [facility] after an elevated blood pressure at home, and was transferred to MAU after a severely elevated blood pressure in office. Patient reports that she has been experiencing intermittent headaches during her pregnancy, that have been relieved with Tylenol. She reports that since Friday 2/25 she has had 8/10 RUQ abdominal pain and increased lower extremity swelling. She states she has been experiencing Braxton Hicks contractions that have remained irregular and are relieved by ambulation and increasing fluid intake. She denies current headache, vision changes, hearing changes, SOB, CP, N/V, dysuria, hematuria, straining, constipation, diarrhea, melena, or hematochezia. She has not been on medication to control her HTN previously.   OB History    Gravida  2   Para      Term      Preterm      AB  1   Living        SAB  1   IAB      Ectopic      Multiple      Live Births              Past Medical History:  Diagnosis Date  . BMI 45.0-49.9, adult (HCC) 05/13/2020  . COVID-19 affecting pregnancy in second trimester 04/29/2020  . Scoliosis     Past Surgical History:  Procedure Laterality Date  . GALLBLADDER SURGERY      Family History  Problem Relation Age of Onset  . Hypertension Father   . Diabetes Father     Social History   Tobacco Use  . Smoking status: Former Smoker    Types: Cigarettes  . Smokeless tobacco: Never Used  . Tobacco comment: 2 cigarettes a day   Vaping Use  . Vaping Use: Never used  Substance Use Topics  . Alcohol use: Not Currently  . Drug use: Never    Allergies:  Allergies  Allergen Reactions  . Naproxen Swelling    Medications Prior to Admission  Medication  Sig Dispense Refill Last Dose  . aspirin (ASPIRIN CHILDRENS) 81 MG chewable tablet Chew 1 tablet (81 mg total) by mouth daily. 64 tablet 0 06/17/2020 at 0800  . Prenatal Vit-Fe Fumarate-FA (PREPLUS) 27-1 MG TABS Take 1 tablet by mouth daily. 90 tablet 3 06/17/2020 at 0800  . Blood Pressure Monitoring (BLOOD PRESSURE MONITOR AUTOMAT) DEVI 1 Device by Does not apply route daily. Automatic blood pressure cuff regular size. To monitor blood pressure regularly at home. ICD-10 code:Z34.90 1 each 0   . Elastic Bandages & Supports (COMFORT FIT MATERNITY SUPP SM) MISC 1 Units by Does not apply route daily as needed. 1 each 0     Review of Systems  Constitutional: Positive for fatigue. Negative for chills and fever.  HENT: Negative for congestion, rhinorrhea and sore throat.   Eyes: Negative for visual disturbance.  Respiratory: Negative for cough, chest tightness and shortness of breath.   Cardiovascular: Positive for leg swelling. Negative for chest pain and palpitations.  Gastrointestinal: Positive for abdominal pain. Negative for blood in stool, constipation, diarrhea, nausea and vomiting.  Genitourinary: Positive for pelvic pain. Negative for difficulty urinating, dysuria, hematuria, vaginal bleeding, vaginal discharge and vaginal pain.  Musculoskeletal: Negative.   Skin: Negative.   Neurological: Negative  for dizziness, syncope, numbness and headaches.  Psychiatric/Behavioral: Negative.    Physical Exam   Blood pressure (!) 166/97, pulse 90, temperature 97.8 F (36.6 C), resp. rate 20, weight (!) 142.1 kg, last menstrual period 09/23/2019.  Physical Exam Constitutional:      General: She is not in acute distress.    Appearance: Normal appearance. She is obese.  HENT:     Head: Normocephalic and atraumatic.  Eyes:     Extraocular Movements: Extraocular movements intact.     Conjunctiva/sclera: Conjunctivae normal.     Pupils: Pupils are equal, round, and reactive to light.  Cardiovascular:      Rate and Rhythm: Normal rate and regular rhythm.     Pulses: Normal pulses.     Heart sounds: Normal heart sounds. No murmur heard. No friction rub. No gallop.   Pulmonary:     Effort: Pulmonary effort is normal.     Breath sounds: Normal breath sounds. No wheezing, rhonchi or rales.  Abdominal:     Palpations: Abdomen is soft.     Tenderness: There is abdominal tenderness in the right upper quadrant. Positive signs include Murphy's sign.  Musculoskeletal:     Right lower leg: 1+ Pitting Edema present.     Left lower leg: 1+ Pitting Edema present.  Skin:    General: Skin is warm and dry.     Capillary Refill: Capillary refill takes less than 2 seconds.  Neurological:     General: No focal deficit present.     Mental Status: She is alert and oriented to person, place, and time.  Psychiatric:        Mood and Affect: Mood and affect normal.        Behavior: Behavior is cooperative.     MAU Course  - Transfer taken from [facility] - Patient placed on cardiac and fetal monitoring - IV placed in the LEFT forearm, started continuous LR infusion - Urine sample collected, UA and protein/creatinine ratio ordered - Pre-eclampsia panel drawn  - Per protocol magnesium sulfate 4 g bolus and subsequent 2 g/hr continuous infusion started - Per protocol labetalol 20 mg started -GC/Chlamydia probe ordered -GBS culture ordered -RPR ordered -Consented for cesarean section  MDM H&P type: Comprehensive Decision making: High complexity  Time spent: more than 45 minutes spent with patient   Assessment and Plan  1. Superimposed severe pre-eclampsia - Multiple blood pressures in severe range over 160/110 - 1 week of continuous RUQ pain with + Murphy's sign - Pre-eclampsia labs pending - Magnesium sulfate and labetalol given via IV  2. Third trimester pregnancy with established severe IUGR -Continue fetal monitoring  -Breech presentation  3. Chronic hypertension,  uncontrolled  4. BMI 53.79, established   Gillermo Murdoch, PA-S 06/17/2020, 12:49 PM

## 2020-06-17 NOTE — H&P (Signed)
OBSTETRIC ADMISSION HISTORY AND PHYSICAL  Brianna Lee is a 30 y.o. female G2P0010 with IUP at 105w6d by 8wk u/s presenting from clinic for severe range BP. She reports +FMs, No LOF, no VB, no blurry vision, headaches or peripheral edema, and RUQ pain.  She plans on breast feeding. She request post placental liletta for birth control. 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 BMI 45.0-49.9, adult (HCC) 05/13/2020  . COVID-19 affecting pregnancy in second trimester 04/29/2020  . Scoliosis     Past Surgical History: Past Surgical History:  Procedure Laterality Date  . GALLBLADDER SURGERY      Obstetrical History: OB History    Gravida  2   Para      Term      Preterm      AB  1   Living        SAB  1   IAB      Ectopic      Multiple      Live Births              Social History Social History   Socioeconomic History  . Marital status: Single    Spouse name: Not on file  . Number of children: Not on file  . Years of education: Not on file  . Highest education level: High school graduate  Occupational History  . Not on file  Tobacco Use  . Smoking status: Former Smoker    Types: Cigarettes  . Smokeless tobacco: Never Used  . Tobacco comment: 2 cigarettes a day   Vaping Use  . Vaping Use: Never used  Substance and Sexual Activity  . Alcohol use: Not Currently  . Drug use: Never  . Sexual activity: Yes    Birth control/protection: None  Other Topics Concern  . Not on file  Social History  Narrative  . Not on file   Social Determinants of Health   Financial Resource Strain: Low Risk   . Difficulty of Paying Living Expenses: Not hard at all  Food Insecurity: No Food Insecurity  . Worried About Programme researcher, broadcasting/film/video in the Last Year: Never true  . Ran Out of Food in the Last Year: Never true  Transportation Needs: No Transportation Needs  . Lack of Transportation (Medical): No  . Lack of Transportation (Non-Medical): No  Physical Activity: Sufficiently Active  . Days of Exercise per Week: 4 days  . Minutes of Exercise per Session: 40 min  Stress: No Stress Concern Present  . Feeling of Stress : Not at all  Social Connections: Moderately Integrated  . Frequency of Communication with Friends and Family: More than three times a week  . Frequency of Social Gatherings with Friends and Family: Twice a week  . Attends Religious Services: More than 4 times per year  . Active Member of Clubs or Organizations: No  . Attends Banker Meetings: Never  . Marital Status: Living with partner    Family History: Family History  Problem Relation Age of Onset  .  Hypertension Father   . Diabetes Father     Allergies: Allergies  Allergen Reactions  . Naproxen Swelling    Medications Prior to Admission  Medication Sig Dispense Refill Last Dose  . aspirin (ASPIRIN CHILDRENS) 81 MG chewable tablet Chew 1 tablet (81 mg total) by mouth daily. 64 tablet 0 06/17/2020 at 0800  . Prenatal Vit-Fe Fumarate-FA (PREPLUS) 27-1 MG TABS Take 1 tablet by mouth daily. 90 tablet 3 06/17/2020 at 0800  . Blood Pressure Monitoring (BLOOD PRESSURE MONITOR AUTOMAT) DEVI 1 Device by Does not apply route daily. Automatic blood pressure cuff regular size. To monitor blood pressure regularly at home. ICD-10 code:Z34.90 1 each 0   . Elastic Bandages & Supports (COMFORT FIT MATERNITY SUPP SM) MISC 1 Units by Does not apply route daily as needed. 1 each 0      Review of Systems   All systems  reviewed and negative except as stated in HPI  Blood pressure (!) 175/93, pulse 89, temperature 97.8 F (36.6 C), resp. rate 20, weight (!) 142.1 kg, last menstrual period 09/23/2019. General appearance: alert, cooperative and no distress Lungs: normal respiratory effort Heart: regular rate and rhythm Abdomen: soft, non-tender; gravid Pelvic: not indicated Extremities: Homans sign is negative, no sign of DVT Presentation: breech by BSUS Fetal monitoringBaseline: 140 bpm, Variability: Good {> 6 bpm), Accelerations: Reactive and Decelerations: Absent Uterine activityFrequency: Every 1-7 minutes     Prenatal labs: ABO, Rh: --/--/O POS (03/03 1215) Antibody: PENDING (03/03 1215) Rubella: 2.56 (08/24 1012) RPR: Non Reactive (01/20 0817)  HBsAg: Negative (08/24 1012)  HIV: Non Reactive (01/20 0817)  GBS:   unknown 2 hr Glucola passed Genetic screening  Silent alpha thal carrier, 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, otherwise nml Anatomy US normal  Prenatal Transfer Tool  Maternal Diabetes: No Genetic Screening: Normal, except as noted above Maternal Ultrasounds/Referrals: IUGR Fetal Ultrasounds or other Referrals:  Referred to Materal Fetal Medicine  Maternal Substance Abuse:  No Significant Maternal Medications:  None Significant Maternal Lab Results: None  Results for orders placed or performed during the hospital encounter of 06/17/20 (from the past 24 hour(s))  CBC   Collection Time: 06/17/20 12:13 PM  Result Value Ref Range   WBC 13.7 (H) 4.0 - 10.5 K/uL   RBC 5.24 (H) 3.87 - 5.11 MIL/uL   Hemoglobin 14.3 12.0 - 15.0 g/dL   HCT 93.8 18.2 - 99.3 %   MCV 83.8 80.0 - 100.0 fL   MCH 27.3 26.0 - 34.0 pg   MCHC 32.6 30.0 - 36.0 g/dL   RDW 71.6 96.7 - 89.3 %   Platelets 318 150 - 400 K/uL   nRBC 0.1 0.0 - 0.2 %  Comprehensive metabolic panel   Collection Time: 06/17/20 12:13 PM  Result Value Ref Range   Sodium 137 135 - 145 mmol/L   Potassium 4.2 3.5 - 5.1 mmol/L   Chloride 107 98 -  111 mmol/L   CO2 21 (L) 22 - 32 mmol/L   Glucose, Bld 73 70 - 99 mg/dL   BUN 8 6 - 20 mg/dL   Creatinine, Ser 8.10 0.44 - 1.00 mg/dL   Calcium 9.0 8.9 - 17.5 mg/dL   Total Protein 6.1 (L) 6.5 - 8.1 g/dL   Albumin 1.9 (L) 3.5 - 5.0 g/dL   AST 27 15 - 41 U/L   ALT 26 0 - 44 U/L   Alkaline Phosphatase 147 (H) 38 - 126 U/L   Total Bilirubin 0.5 0.3 - 1.2 mg/dL   GFR,  Estimated >60 >60 mL/min   Anion gap 9 5 - 15  Type and screen [facility]   Collection Time: 06/17/20 12:15 PM  Result Value Ref Range   ABO/RH(D) O POS    Antibody Screen PENDING    Sample Expiration      06/20/2020,2359 Performed at [facility], [address] [place], [place] [address]     Patient Active Problem List   Diagnosis Date Noted  . Severe preeclampsia 06/17/2020  . Genetic carrier 06/17/2020  . Chronic hypertension affecting pregnancy 06/02/2020  . Morbid obesity (HCC) 05/13/2020  . Fall onto gravid abdomen in third trimester 05/13/2020  . Obesity affecting pregnancy, antepartum 01/10/2020  . Folliculitis 01/10/2020  . Trichomonal vaginitis during pregnancy in first trimester 12/10/2019  . Supervision of other normal pregnancy, antepartum 12/09/2019    Assessment/Plan:  Brianna Lee is a 30 y.o. G2P0010 at [redacted]w[redacted]d here for severe range BP, rules in for cHTN w/ SIPE w/SF and found to have breech presentation.  #pLTCS  The risks of cesarean section were discussed with the patient including but were not limited to: bleeding which may require transfusion or reoperation; infection which may require antibiotics; injury to bowel, bladder, ureters or other surrounding organs; injury to the fetus; need for additional procedures including hysterectomy in the event of a life-threatening hemorrhage; placental abnormalities wth subsequent pregnancies, incisional problems, thromboembolic phenomenon and other postoperative/anesthesia complications. The patient concurred with the proposed plan,  giving informed written consent for the procedures.  Patient has been NPO since 0730 she will remain NPO for procedure. Anesthesia and OR aware.  Preoperative prophylactic antibiotics and SCDs ordered on call to the OR.  To OR when ready.  #Pain: spinal #FWB: Cat 1 #ID: GBS unk, given route of delivery intra-op ancef #MOF: breast #MOC: post placental liletta, risks/benefits discussed, order placed #Circ: yes  #cHTN w/SIPE w/ SF: severe range BP in clinic and on presentation to MAU. preE labs pending. Currently asymptomatic. Anti-htn protocol and Mg initiated.  #BMI 54   Alric Seton, MD  06/17/2020, 1:31 PM

## 2020-06-17 NOTE — Progress Notes (Signed)
Dr. Crissie Reese @ bedside discussing POC, plan includes perforrning primary Cesarean Section secondary severe PRe Eclampsia diagnosis.

## 2020-06-17 NOTE — Lactation Note (Signed)
This note was copied from a baby's chart. Lactation Consultation Note  Patient Name: Brianna Lee [id number] Date: 06/17/2020 Age:30 hours  Initial visit to P1 mother of 3 hours old LPTI currently in NICU.   Parents request lactation consult to be done at a later time today. LC will try to come back at a later time.   Feeding Nipple Type: Nfant Extra Slow Flow (gold)   Vyom Brass A Higuera Ancidey 06/17/2020, 7:15 PM

## 2020-06-17 NOTE — Anesthesia Postprocedure Evaluation (Signed)
Anesthesia Post Note  Patient: Brianna Lee  Procedure(s) Performed: CESAREAN SECTION (N/A )     Patient location during evaluation: Mother Baby Anesthesia Type: Spinal Level of consciousness: oriented and awake and alert Pain management: pain level controlled Vital Signs Assessment: post-procedure vital signs reviewed and stable Respiratory status: spontaneous breathing and respiratory function stable Cardiovascular status: blood pressure returned to baseline and stable Postop Assessment: no headache, no backache, no apparent nausea or vomiting and able to ambulate Anesthetic complications: no   No complications documented.  Last Vitals:  Vitals:   06/17/20 1700 06/17/20 1715  BP: (!) 142/81 135/79  Pulse: 96 89  Resp: 16 18  Temp:  36.4 C  SpO2: 99% 98%    Last Pain:  Vitals:   06/17/20 1715  TempSrc: Oral  PainSc: 9    Pain Goal:    LLE Motor Response: Non-purposeful movement (06/17/20 1715)   RLE Motor Response: Non-purposeful movement (06/17/20 1715)       Epidural/Spinal Function Cutaneous sensation: Tingles (06/17/20 1715), Patient able to flex knees: No (06/17/20 1715), Patient able to lift hips off bed: No (06/17/20 1715), Back pain beyond tenderness at insertion site: No (06/17/20 1715), Progressively worsening motor and/or sensory loss: No (06/17/20 1715), Bowel and/or bladder incontinence post epidural: No (06/17/20 1715)  Trevor Iha

## 2020-06-17 NOTE — Progress Notes (Signed)
°  HIGH-RISK PREGNANCY OFFICE VISIT Patient name: Brianna Lee MRN [id number]  Date of birth: 1990-06-30 Chief Complaint:   Routine Prenatal Visit  History of Present Illness:   Brianna Lee is a 30 y.o. G2P0010 female at [redacted]w[redacted]d with an Estimated Date of Delivery: 07/23/20 being seen today for ongoing management of a high-risk pregnancy complicated by [facility] currently on no meds and obesity Today she reports swelling and occasional H/As. Contractions: Irregular. Vag. Bleeding: None.  Movement: Present. denies leaking of fluid.  Review of Systems:   Pertinent items are noted in HPI Denies abnormal vaginal discharge w/ itching/odor/irritation, headaches, visual changes, shortness of breath, chest pain, abdominal pain, severe nausea/vomiting, or problems with urination or bowel movements unless otherwise stated above. Pertinent History Reviewed:  Reviewed past medical,surgical, social, obstetrical and family history.  Reviewed problem list, medications and allergies. Physical Assessment:   Vitals:   06/17/20 1034 06/17/20 1047 06/17/20 1103  BP: (!) 160/114 (!) 168/117 (!) 178/137  Pulse: (!) 109 (!) 106 (!) 103  Temp: 98.2 F (36.8 C)    Weight: (!) 314 lb (142.4 kg)    Body mass index is 53.9 kg/m.           Physical Examination:   General appearance: alert, well appearing, and in no distress, oriented to person, place, and time and overweight  Mental status: alert, oriented to person, place, and time, normal mood, behavior, speech, dress, motor activity, and thought processes  Skin: warm & dry   Extremities: Edema: Moderate pitting, indentation subsides rapidly    Cardiovascular: normal heart rate noted  Respiratory: normal respiratory effort, no distress  Abdomen: gravid, soft, non-tender  Pelvic: Cervical exam deferred         Fetal Status: Fetal Heart Rate (bpm): 135 Fundal Height: 45 cm Movement: Present    Fetal Surveillance Testing today: none   No results found for this or any  previous visit (from the past 24 hour(s)).  Assessment & Plan:  1) High-risk pregnancy G2P0010 at [redacted]w[redacted]d with an Estimated Date of Delivery: 07/23/20   2) Supervision of high risk pregnancy, antepartum - Advised to go to MAU for evaluation of PEC - Advised that she may get be diagnosed with superimposed PEC and get admitted to the hospital, so be prepared - Donette Larry, CNM notified of patient being sent to MAU for evaluation -- she will notify MAU charge  3) Chronic hypertension affecting pregnancy - Taking bASA daily  4) [redacted] weeks gestation of pregnancy  5) Edema during pregnancy in third trimester - Advised the amount of edema is concerning for PEC  Meds: No orders of the defined types were placed in this encounter.   Labs/procedures today: sent to MAU for PEC evaluation  Treatment Plan:  PEC work-up in MAU  Reviewed: Preterm labor symptoms and general obstetric precautions including but not limited to vaginal bleeding, contractions, leaking of fluid and fetal movement were reviewed in detail with the patient.  All questions were answered. Has home bp cuff. Check bp weekly, let us know if >140/90.   Follow-up: Return in about 1 week (around 06/24/2020) for Return OB visit.  No orders of the defined types were placed in this encounter.  Raelyn Mora MSN, CNM 06/17/2020 11:07 AM

## 2020-06-17 NOTE — Discharge Summary (Signed)
Postpartum Discharge Summary     Patient Name: Brianna Lee DOB: May 03, 1990 MRN: [id number]  Date of admission: 06/17/2020 Delivery date:06/17/2020  Delivering provider: Verita Schneiders A  Date of discharge: 06/20/2020  Admitting diagnosis: Pregnancy at 82/6. 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      Discharge diagnosis: Preterm Pregnancy Delivered and Preeclampsia (severe)                                              Post partum procedures:post placental liletta Augmentation: N/A Complications: None  Hospital course: Sceduled C/S   30y.o. yo G2P0010 at [id number] admitted to the hospital 06/17/2020 for scheduled cesarean section with the following indication:Malpresentation. Patient presented to [facility] 06/17/20 from clinic with severe range BP and was initiated on Mg and anti-hypertension protocol. Given breech presentation decision was made to proceed with cesarean delivery. Delivery details are as follows:  Membrane Rupture Time/Date: 3:34 PM ,06/17/2020   Delivery Method:C-Section, Low Transverse  Details of operation can be found in separate operative note but she also had a Liletta IUD placed and  Prevena placed.  Patient had an uncomplicated postpartum course.  She is ambulating, tolerating a regular diet, passing flatus, and urinating well. Patient is discharged home in stable condition on  06/20/20         Newborn Data: Birth date:06/17/2020  Birth time:3:36 PM  Gender:Female  Living status:Living  Apgars: ,  Weight:2320 g     Magnesium Sulfate received: Yes: Neuroprotection BMZ received: No Rhophylac:N/A MMR:N/A  Physical exam  Vitals:   06/19/20 2001 06/19/20 2312 06/20/20 0416 06/20/20 0812  BP: (!) 145/71 (!) 150/92 (!) 143/76 (!) 150/73   Pulse: 97 (!) 117 (!) 110 98  Resp: _0 Temp: 98.2 F (36.8 C) 98.4 F (36.9 C) 98 F (36.7 C) 98.2 F (36.8 C)  TempSrc: Oral Oral Oral Oral  SpO2: 100% 98% 98% 98%  Weight:       General: alert  CV: normal s1 and s2, no MRGs Pulmonary: CTAB Lochia: appropriate Abdomen: obese, soft, nttp, +BS. prevena in place DVT Evaluation: No evidence of DVT seen on physical exam. Labs: Lab Results  Component Value Date   WBC 20.5 (H) 06/18/2020   HGB 12.5 06/18/2020   HCT 36.2 06/18/2020   MCV 82.3 06/18/2020   PLT 314 06/18/2020   CMP Latest Ref Rng & Units 06/18/2020  Glucose 70 - 99 mg/dL 124(H)  BUN 6 - 20 mg/dL 10  Creatinine 0.44 - 1.00 mg/dL 0.82  Sodium 135 - 145 mmol/L 131(L)  Potassium 3.5 - 5.1 mmol/L 4.8  Chloride 98 - 111 mmol/L 100  CO2 22 - 32 mmol/L 20(L)  Calcium 8.9 - 10.3 mg/dL 7.9(L)  Total Protein 6.5 - 8.1 g/dL 4.9(L)  Total Bilirubin 0.3 - 1.2 mg/dL 0.4  Alkaline Phos 38 - 126 U/L 132(H)  AST 15 - 41 U/L 28  ALT 0 - 44 U/L 21   Edinburgh Score: Edinburgh Postnatal Depression Scale Screening Tool 06/18/2020  I have been able to laugh and see the funny side of things. 0  I have looked forward with enjoyment  to things. 0  I have blamed myself unnecessarily when things went wrong. 1  I have been anxious or worried for no good reason. 2  I have felt scared or panicky for no good reason. 0  Things have been getting on top of me. 0  I have been so unhappy that I have had difficulty sleeping. 0  I have felt sad or miserable. 1  I have been so unhappy that I have been crying. 0  The thought of harming myself has occurred to me. 0  Edinburgh Postnatal Depression Scale Total 4     After visit meds:  Allergies as of 06/20/2020      Reactions   Naproxen Swelling      Medication List    STOP taking these medications   aspirin 81 MG chewable tablet Commonly known as: Aspirin Childrens     TAKE these medications   Blood Pressure Monitor Automat  Devi 1 Device by Does not apply route daily. Automatic blood pressure cuff regular size. To monitor blood pressure regularly at home. ICD-10 code:Z34.90   [place] 1 Units by Does not apply route daily as needed.   ferrous sulfate 325 (65 FE) MG tablet Take 1 tablet (325 mg total) by mouth every other day.   NIFEdipine 30 MG 24 hr tablet Commonly known as: ADALAT CC Take 1 tablet (30 mg total) by mouth daily. Start taking on: June 21, 2020   oxyCODONE-acetaminophen 5-325 MG tablet Commonly known as: PERCOCET/ROXICET Take 1-2 tablets by mouth every 4 (four) hours as needed for severe pain ((when tolerating fluids)).   polyethylene glycol powder 17 GM/SCOOP powder Commonly known as: MiraLax Take 17 g by mouth daily. Take one tablespoon once daily for constipation   PrePLUS 27-1 MG Tabs Take 1 tablet by mouth daily.   simethicone 80 MG chewable tablet Commonly known as: MYLICON Chew 1 tablet (80 mg total) by mouth as needed for flatulence.        Discharge home in stable condition Infant Feeding: Bottle Infant Disposition:NICU Discharge instruction: per After Visit Summary and Postpartum booklet. Activity: Advance as tolerated. Pelvic rest for 6 weeks.  Diet: routine diet Future Appointments: Future Appointments  Date Time Provider [place]  06/24/2020  1:00 PM [place] None  07/29/2020 10:30 AM Laury Deep, CNM [facility] None   Follow up Visit:  Follow-up Information    [facility]. Go in 1 week(s).   Specialty: Radiology Why: the office will call you on mondary or tuesday to set up this appointment Contact information: [place] [phone]             Message sent to [place] 06/17/20 by Sylvester Harder.   Please schedule this patient for a In person postpartum visit in 6 weeks with the following provider: Any provider. Additional  Postpartum F/U:Incision check 1 week and BP check 1 week  High risk pregnancy complicated by: HTN Delivery mode:  C-Section, Low Transverse  Anticipated Birth Control:  PP IUD placed   06/20/2020 Aletha Halim, MD

## 2020-06-17 NOTE — Progress Notes (Signed)
Dr. Richardson Landry into see pt prior to Cesarean section.

## 2020-06-18 ENCOUNTER — Encounter (HOSPITAL_COMMUNITY): Payer: Self-pay | Admitting: Obstetrics & Gynecology

## 2020-06-18 LAB — CBC
HCT: 36.2 % (ref 36.0–46.0)
Hemoglobin: 12.5 g/dL (ref 12.0–15.0)
MCH: 28.4 pg (ref 26.0–34.0)
MCHC: 34.5 g/dL (ref 30.0–36.0)
MCV: 82.3 fL (ref 80.0–100.0)
Platelets: 314 10*3/uL (ref 150–400)
RBC: 4.4 MIL/uL (ref 3.87–5.11)
RDW: 14.2 % (ref 11.5–15.5)
WBC: 20.5 10*3/uL — ABNORMAL HIGH (ref 4.0–10.5)
nRBC: 0.1 % (ref 0.0–0.2)

## 2020-06-18 LAB — COMPREHENSIVE METABOLIC PANEL
ALT: 21 U/L (ref 0–44)
AST: 28 U/L (ref 15–41)
Albumin: 1.7 g/dL — ABNORMAL LOW (ref 3.5–5.0)
Alkaline Phosphatase: 132 U/L — ABNORMAL HIGH (ref 38–126)
Anion gap: 11 (ref 5–15)
BUN: 10 mg/dL (ref 6–20)
CO2: 20 mmol/L — ABNORMAL LOW (ref 22–32)
Calcium: 7.9 mg/dL — ABNORMAL LOW (ref 8.9–10.3)
Chloride: 100 mmol/L (ref 98–111)
Creatinine, Ser: 0.82 mg/dL (ref 0.44–1.00)
GFR, Estimated: >60 mL/min (ref >60)
Glucose, Bld: 124 mg/dL — ABNORMAL HIGH (ref 70–99)
Potassium: 4.8 mmol/L (ref 3.5–5.1)
Sodium: 131 mmol/L — ABNORMAL LOW (ref 135–145)
Total Bilirubin: 0.4 mg/dL (ref 0.3–1.2)
Total Protein: 4.9 g/dL — ABNORMAL LOW (ref 6.5–8.1)

## 2020-06-18 LAB — GC/CHLAMYDIA PROBE AMP (~~LOC~~) NOT AT ARMC
Chlamydia: NEGATIVE
Comment: NEGATIVE
Comment: NORMAL
Neisseria Gonorrhea: NEGATIVE

## 2020-06-18 LAB — MAGNESIUM: Magnesium: 4.9 mg/dL — ABNORMAL HIGH (ref 1.7–2.4)

## 2020-06-18 LAB — RPR: RPR Ser Ql: NONREACTIVE

## 2020-06-18 NOTE — Lactation Note (Signed)
This note was copied from a baby's chart. Lactation Consultation Note  Patient Name: Brianna Lee Date: 06/18/2020 Reason for consult: Initial assessment;NICU baby;Late-preterm 34-36.6wks Age:30 hours  I conducted an initial lactation consult with Brianna Lee. Her chart did not indicate a feeding preference at this time, and her 59 hour old son is in the NICU. I asked her if she would like to initiate breast pumping. She declined, and she declined lactation services.  I notified NICU and OB RNs. OB RN confirmed that Brianna Lee consented to the use of DBM at this time, but she does not plan to feed her EBM to baby.   Feeding Mother's Current Feeding Choice: Formula   Consult Status Consult Status: Complete    Walker Shadow 06/18/2020, 8:52 AM

## 2020-06-18 NOTE — Progress Notes (Signed)
Postpartum Day 1: Cesarean Delivery at [redacted]w[redacted]d for breech presentation, chronic hypertension with superimposed severe preeclampsia.  Subjective: Patient reports incisional pain and tolerating PO.  Denies any headaches, visual symptoms, RUQ/epigastric pain or other concerning symptoms. Minimal lochia reported, not much OOB yet. Baby is stable at NICU.  Objective: Vital signs in last 24 hours: Temp:  [97.4 F (36.3 C)-98.2 F (36.8 C)] 98.2 F (36.8 C) (03/04 0412) Pulse Rate:  [89-109] 97 (03/04 0412) Resp:  [12-21] 18 (03/04 0412) BP: (124-178)/(65-137) 139/82 (03/04 0412) SpO2:  [97 %-99 %] 97 % (03/04 0412) Weight:  [142.1 kg-142.4 kg] 142.1 kg (03/03 1130) Vitals:   06/17/20 1827 06/17/20 2012 06/18/20 0000 06/18/20 0412  BP: (!) 156/81 (!) 155/89 (!) 142/65 139/82  Pulse: 90 98 100 97  Resp: 18 18 18 18   Temp:  97.6 F (36.4 C) 98.2 F (36.8 C) 98.2 F (36.8 C)  TempSrc:  Oral Oral Oral  SpO2: 99% 97% 98% 97%  Weight:       Physical Exam:  General: alert and no distress Lochia: appropriate Uterine Fundus: firm Incision: Prevena in place DVT Evaluation: No evidence of DVT seen on physical exam.  Negative Homan's sign. No cords or calf tenderness. No significant calf/ankle edema.  Labs: CBC Latest Ref Rng & Units 06/18/2020 06/17/2020 05/19/2020  WBC 4.0 - 10.5 K/uL 20.5(H) 13.7(H) 10.8  Hemoglobin 12.0 - 15.0 g/dL 07/17/2020 29.9 37.1  Hematocrit 36.0 - 46.0 % 36.2 43.9 40.2  Platelets 150 - 400 K/uL 314 318 323    CMP Latest Ref Rng & Units 06/18/2020 06/17/2020 05/19/2020  Glucose 70 - 99 mg/dL 07/17/2020) 73 70  BUN 6 - 20 mg/dL 10 8 6   Creatinine 0.44 - 1.00 mg/dL 789(F 8.10)  Sodium 135 - 145 mmol/L 131(L) 137 136  Potassium 3.5 - 5.1 mmol/L 4.8 4.2 4.2  Chloride 98 - 111 mmol/L 100 107 102  CO2 22 - 32 mmol/L 20(L) 21(L) 21  Calcium 8.9 - 10.3 mg/dL 7.9(L) 9.0 9.2  Total Protein 6.5 - 8.1 g/dL 4.9(L) 6.1(L) 6.2  Total Bilirubin 0.3 - 1.2 mg/dL 0.4 0.5 1.75  Alkaline Phos  38 - 126 U/L 132(H) 147(H) 159(H)  AST 15 - 41 U/L 28 27 24   ALT 0 - 44 U/L 21 26 27    Assessment/Plan: Status post Cesarean section. Doing well postoperatively.  Continue magnesium sulfate x 24 hours postpartum Procardia XL 30 mg daily ordered for BP control, stable for now Encourage OOB, diet as tolerated Breastfeeding, Liletta placed during cesarean section Continue routine postpartum care.  1.02(H, MD 06/18/2020, 6:30 AM

## 2020-06-19 LAB — CULTURE, BETA STREP (GROUP B ONLY)

## 2020-06-19 NOTE — Plan of Care (Signed)
  Problem: Education: Goal: Knowledge of condition will improve Outcome: Progressing   Problem: Activity: Goal: Will verbalize the importance of balancing activity with adequate rest periods Outcome: Progressing Goal: Ability to tolerate increased activity will improve Outcome: Progressing   Problem: Life Cycle: Goal: Chance of risk for complications during the postpartum period will decrease Outcome: Progressing   Problem: Role Relationship: Goal: Ability to demonstrate positive interaction with newborn will improve Outcome: Progressing   Problem: Skin Integrity: Goal: Demonstration of wound healing without infection will improve Outcome: Progressing   Problem: Education: Goal: Knowledge of disease or condition will improve Outcome: Progressing Goal: Knowledge of the prescribed therapeutic regimen will improve Outcome: Progressing   Problem: Clinical Measurements: Goal: Complications related to disease process, condition or treatment will be avoided or minimized Outcome: Progressing

## 2020-06-19 NOTE — Progress Notes (Signed)
Subjective: Postpartum Day 2: Cesarean Delivery d/t to breech, CHTN with [facility] Patient has no complaints this morning. Ambulating, tolerating diet, voiding and good oral pain control  Objective: Vital signs in last 24 hours: Temp:  [97.7 F (36.5 C)-98.7 F (37.1 C)] 98 F (36.7 C) (03/05 0348) Pulse Rate:  [83-118] 118 (03/05 0348) Resp:  [16-20] 20 (03/05 0348) BP: (128-147)/(66-80) 145/80 (03/05 0348) SpO2:  [96 %-100 %] 97 % (03/05 0348)  Physical Exam:  General: alert Lochia: appropriate Uterine Fundus: firm Incision: healing well DVT Evaluation: No evidence of DVT seen on physical exam.  Recent Labs    06/17/20 1213 06/18/20 0517  HGB 14.3 12.5  HCT 43.9 36.2    Assessment/Plan: Status post Cesarean section. Doing well postoperatively. BP stable on Procardia. Encourage ambulation. Continue current care.  Hermina Staggers 06/19/2020, 7:24 AM

## 2020-06-20 MED ORDER — POLYETHYLENE GLYCOL 3350 17 GM/SCOOP PO POWD
17.0000 g | Freq: Every day | ORAL | 0 refills | Status: DC
Start: 2020-06-20 — End: 2020-12-10

## 2020-06-20 MED ORDER — OXYCODONE-ACETAMINOPHEN 5-325 MG PO TABS: 1.0000 | ORAL_TABLET | ORAL | 0 refills | Status: DC | PRN

## 2020-06-20 MED ORDER — FERROUS SULFATE 325 (65 FE) MG PO TABS: 325.0000 mg | ORAL_TABLET | ORAL | 0 refills | Status: DC

## 2020-06-20 MED ORDER — NIFEDIPINE ER 30 MG PO TB24: 30.0000 mg | ORAL_TABLET | Freq: Every day | ORAL | 0 refills | Status: DC

## 2020-06-20 MED ORDER — SIMETHICONE 80 MG PO CHEW: 80.0000 mg | CHEWABLE_TABLET | ORAL | 0 refills | Status: DC | PRN

## 2020-06-20 NOTE — Plan of Care (Signed)
  Problem: Education: Goal: Knowledge of General Education information will improve Description: Including pain rating scale, medication(s)/side effects and non-pharmacologic comfort measures Outcome: Completed/Met   Problem: Health Behavior/Discharge Planning: Goal: Ability to manage health-related needs will improve Outcome: Completed/Met   Problem: Clinical Measurements: Goal: Ability to maintain clinical measurements within normal limits will improve Outcome: Completed/Met Goal: Will remain free from infection Outcome: Completed/Met Goal: Diagnostic test results will improve Outcome: Completed/Met Goal: Respiratory complications will improve Outcome: Completed/Met Goal: Cardiovascular complication will be avoided Outcome: Completed/Met   Problem: Activity: Goal: Risk for activity intolerance will decrease Outcome: Completed/Met   Problem: Nutrition: Goal: Adequate nutrition will be maintained Outcome: Completed/Met   Problem: Coping: Goal: Level of anxiety will decrease Outcome: Completed/Met   Problem: Elimination: Goal: Will not experience complications related to bowel motility Outcome: Completed/Met Goal: Will not experience complications related to urinary retention Outcome: Completed/Met   Problem: Pain Managment: Goal: General experience of comfort will improve Outcome: Completed/Met   Problem: Safety: Goal: Ability to remain free from injury will improve Outcome: Completed/Met   Problem: Skin Integrity: Goal: Risk for impaired skin integrity will decrease Outcome: Completed/Met   Problem: Education: Goal: Knowledge of condition will improve Outcome: Completed/Met Goal: Individualized Educational Video(s) Outcome: Completed/Met Goal: Individualized Newborn Educational Video(s) Outcome: Completed/Met   Problem: Activity: Goal: Will verbalize the importance of balancing activity with adequate rest periods Outcome: Completed/Met Goal: Ability to  tolerate increased activity will improve Outcome: Completed/Met   Problem: Coping: Goal: Ability to identify and utilize available resources and services will improve Outcome: Completed/Met   Problem: Life Cycle: Goal: Chance of risk for complications during the postpartum period will decrease Outcome: Completed/Met   Problem: Role Relationship: Goal: Ability to demonstrate positive interaction with newborn will improve Outcome: Completed/Met   Problem: Skin Integrity: Goal: Demonstration of wound healing without infection will improve Outcome: Completed/Met   Problem: Education: Goal: Knowledge of disease or condition will improve Outcome: Completed/Met Goal: Knowledge of the prescribed therapeutic regimen will improve Outcome: Completed/Met   Problem: Fluid Volume: Goal: Peripheral tissue perfusion will improve Outcome: Completed/Met   Problem: Clinical Measurements: Goal: Complications related to disease process, condition or treatment will be avoided or minimized Outcome: Completed/Met

## 2020-06-20 NOTE — Discharge Instructions (Signed)
Hypertension During Pregnancy Hypertension is also called high blood pressure. High blood pressure means that the force of your blood moving in your body is too strong. It can cause problems for you and your baby. Different types of high blood pressure can happen during pregnancy. The types are:  High blood pressure before you got pregnant. This is called chronic hypertension.  This can continue during your pregnancy. Your doctor will want to keep checking your blood pressure. You may need medicine to keep your blood pressure under control while you are pregnant. You will need follow-up visits after you have your baby.  High blood pressure that goes up during pregnancy when it was normal before. This is called gestational hypertension. It will usually get better after you have your baby, but your doctor will need to watch your blood pressure to make sure that it is getting better.  Very high blood pressure during pregnancy. This is called preeclampsia. Very high blood pressure is an emergency that needs to be checked and treated right away.  You may develop very high blood pressure after giving birth. This is called postpartum preeclampsia. This usually occurs within 48 hours after childbirth but may occur up to 6 weeks after giving birth. This is rare. How does this affect me? If you have high blood pressure during pregnancy, you have a higher chance of developing high blood pressure:  As you get older.  If you get pregnant again. In some cases, high blood pressure during pregnancy can cause:  Stroke.  Heart attack.  Damage to the kidneys, lungs, or liver.  Preeclampsia.  Jerky movements you cannot control (convulsions or seizures).  Problems with the placenta.   What can I do to lower my risk?   Keep a healthy weight.  Eat a healthy diet.  Follow what your doctor tells you about treating any medical problems that you had before becoming pregnant. It is very important to go to  all of your doctor visits. Your doctor will check your blood pressure and make sure that your pregnancy is progressing as it should. Treatment should start early if a problem is found.   Follow these instructions at home:  Take your blood pressure 1-2 times per day. Call the office if your blood pressure is 155 or higher for the top number or 105 or higher for the bottom number.    Eating and drinking   Drink enough fluid to keep your pee (urine) pale yellow.  Avoid caffeine. Lifestyle  Do not use any products that contain nicotine or tobacco, such as cigarettes, e-cigarettes, and chewing tobacco. If you need help quitting, ask your doctor.  Do not use alcohol or drugs.  Avoid stress.  Rest and get plenty of sleep.  Regular exercise can help. Ask your doctor what kinds of exercise are best for you. General instructions  Take over-the-counter and prescription medicines only as told by your doctor.  Keep all prenatal and follow-up visits as told by your doctor. This is important. Contact a doctor if:  You have symptoms that your doctor told you to watch for, such as: ? Headaches. ? Nausea. ? Vomiting. ? Belly (abdominal) pain. ? Dizziness. ? Light-headedness. Get help right away if:  You have: ? Very bad belly pain that does not get better with treatment. ? A very bad headache that does not get better. ? Vomiting that does not get better. ? Sudden, fast weight gain. ? Sudden swelling in your hands, ankles, or face. ?   Blood in your pee. ? Blurry vision. ? Double vision. ? Shortness of breath. ? Chest pain. ? Weakness on one side of your body. ? Trouble talking. Summary  High blood pressure is also called hypertension.  High blood pressure means that the force of your blood moving in your body is too strong.  High blood pressure can cause problems for you and your baby.  Keep all follow-up visits as told by your doctor. This is important. This information is  not intended to replace advice given to you by your health care provider. Make sure you discuss any questions you have with your health care provider. Document Released: 05/06/2010 Document Revised: 07/25/2018 Document Reviewed: 04/30/2018 Elsevier Patient Education  2020 [employer]vier Inc[employer]   Postpartum Care After Cesarean Delivery This sheet gives you information about how to care for yourself from the time you deliver your baby to up to 6-12 weeks after delivery (postpartum period). Your health care provider may also give you more specific instructions. If you have problems or questions, contact your health care provider. Follow these instructions at home: Medicines  Take over-the-counter and prescription medicines only as told by your health care provider.  If you were prescribed an antibiotic medicine, take it as told by your health care provider. Do not stop taking the antibiotic even if you start to feel better.  Ask your health care provider if the medicine prescribed to you: ? Requires you to avoid driving or using heavy machinery. ? Can cause constipation. You may need to take actions to prevent or treat constipation, such as:  Drink enough fluid to keep your urine pale yellow.  Take over-the-counter or prescription medicines.  Eat foods that are high in fiber, such as beans, whole grains, and fresh fruits and vegetables.  Limit foods that are high in fat and processed sugars, such as fried or sweet foods. Activity  Gradually return to your normal activities as told by your health care provider.  Avoid activities that take a lot of effort and energy (are strenuous) until approved by your health care provider. Walking at a slow to moderate pace is usually safe. Ask your health care provider what activities are safe for you. ? Do not lift anything that is heavier than your baby or 10 lb (4.5 kg) as told by your health care provider. ? Do not vacuum, climb stairs, or drive a car for  as long as told by your health care provider.  If possible, have someone help you at home until you are able to do your usual activities yourself.  Rest as much as possible. Try to rest or take naps while your baby is sleeping. Vaginal bleeding  It is normal to have vaginal bleeding (lochia) after delivery. Wear a sanitary pad to absorb vaginal bleeding and discharge. ? During the first week after delivery, the amount and appearance of lochia is often similar to a menstrual period. ? Over the next few weeks, it will gradually decrease to a dry, yellow-brown discharge. ? For most women, lochia stops completely by 4-6 weeks after delivery. Vaginal bleeding can vary from woman to woman.  Change your sanitary pads frequently. Watch for any changes in your flow, such as: ? A sudden increase in volume. ? A change in color. ? Large blood clots.  If you pass a blood clot, save it and call your health care provider to discuss. Do not flush blood clots down the toilet before you get instructions from your health care provider.    Do not use tampons or douches until your health care provider says this is safe.  If you are not breastfeeding, your period should return 6-8 weeks after delivery. If you are breastfeeding, your period may return anytime between 8 weeks after delivery and the time that you stop breastfeeding. Perineal care  If your C-section (Cesarean section) was unplanned, and you were allowed to labor and push before delivery, you may have pain, swelling, and discomfort of the tissue between your vaginal opening and your anus (perineum). You may also have an incision in the tissue (episiotomy) or the tissue may have torn during delivery. Follow these instructions as told by your health care provider: ? Keep your perineum clean and dry as told by your health care provider. Use medicated pads and pain-relieving sprays and creams as directed. ? If you have an episiotomy or vaginal tear, check the  area every day for signs of infection. Check for:  Redness, swelling, or pain.  Fluid or blood.  Warmth.  Pus or a bad smell. ? You may be given a squirt bottle to use instead of wiping to clean the perineum area after you go to the bathroom. As you start healing, you may use the squirt bottle before wiping yourself. Make sure to wipe gently. ? To relieve pain caused by an episiotomy, vaginal tear, or hemorrhoids, try taking a warm sitz bath 2-3 times a day. A sitz bath is a warm water bath that is taken while you are sitting down. The water should only come up to your hips and should cover your buttocks.   Breast care  Within the first few days after delivery, your breasts may feel heavy, full, and uncomfortable (breast engorgement). You may also have milk leaking from your breasts. Your health care provider can suggest ways to help relieve breast discomfort. Breast engorgement should go away within a few days.  If you are breastfeeding: ? Wear a bra that supports your breasts and fits you well. ? Keep your nipples clean and dry. Apply creams and ointments as told by your health care provider. ? You may need to use breast pads to absorb milk leakage. ? You may have uterine contractions every time you breastfeed for several weeks after delivery. Uterine contractions help your uterus return to its normal size. ? If you have any problems with breastfeeding, work with your health care provider or a Advertising copywriter.  If you are not breastfeeding: ? Avoid touching your breasts as this can make your breasts produce more milk. ? Wear a well-fitting bra and use cold packs to help with swelling. ? Do not squeeze out (express) milk. This causes you to make more milk. Intimacy and sexuality  Ask your health care provider when you can engage in sexual activity. This may depend on your: ? Risk of infection. ? Healing rate. ? Comfort and desire to engage in sexual activity.  You are able to  get pregnant after delivery, even if you have not had your period. If desired, talk with your health care provider about methods of family planning or birth control (contraception). Lifestyle  Do not use any products that contain nicotine or tobacco, such as cigarettes, e-cigarettes, and chewing tobacco. If you need help quitting, ask your health care provider.  Do not drink alcohol, especially if you are breastfeeding. Eating and drinking  Drink enough fluid to keep your urine pale yellow.  Eat high-fiber foods every day. These may help prevent or relieve constipation. High-fiber foods  include: ? Whole grain cereals and breads. ? Brown rice. ? Beans. ? Fresh fruits and vegetables.  Take your prenatal vitamins until your postpartum checkup or until your health care provider tells you it is okay to stop.   General instructions  Keep all follow-up visits for you and your baby as told by your health care provider. Most women visit their health care provider for a postpartum checkup within the first 3-6 weeks after delivery. Contact a health care provider if you:  Feel unable to cope with the changes that a new baby brings to your life, and these feelings do not go away.  Feel unusually sad or worried.  Have breasts that are painful, hard, or turn red.  Have a fever.  Have trouble holding urine or keeping urine from leaking.  Have little or no interest in activities you used to enjoy.  Have not breastfed at all and you have not had a menstrual period for 12 weeks after delivery.  Have stopped breastfeeding and you have not had a menstrual period for 12 weeks after you stopped breastfeeding.  Have questions about caring for yourself or your baby.  Pass a blood clot from your vagina. Get help right away if you:  Have chest pain.  Have difficulty breathing.  Have sudden, severe leg pain.  Have severe pain or cramping in your abdomen.  Bleed from your vagina so much that  you fill more than one sanitary pad in one hour. Bleeding should not be heavier than your heaviest period.  Develop a severe headache.  Faint.  Have blurred vision or spots in your vision.  Have a bad-smelling vaginal discharge.  Have thoughts about hurting yourself or your baby. If you ever feel like you may hurt yourself or others, or have thoughts about taking your own life, get help right away. You can go to your nearest emergency department or call:  Your local emergency services (911 in the [place]  A suicide crisis helpline, such as the National Suicide Prevention Lifeline at 718-706-6479. This is open 24 hours a day. Summary  The period of time from when you deliver your baby to up to 6-12 weeks after delivery is called the postpartum period.  Gradually return to your normal activities as told by your health care provider.  Keep all follow-up visits for you and your baby as told by your health care provider. This information is not intended to replace advice given to you by your health care provider. Make sure you discuss any questions you have with your health care provider. Document Revised: 11/21/2017 Document Reviewed: 11/21/2017 Elsevier Patient Education  2021 [employer].

## 2020-06-21 ENCOUNTER — Ambulatory Visit: Payer: Self-pay

## 2020-06-21 LAB — SURGICAL PATHOLOGY

## 2020-06-21 NOTE — Lactation Note (Signed)
This note was copied from a baby's chart. Lactation Consultation Note  Patient Name: Brianna Lee Date: 06/21/2020 Reason for consult: Follow-up assessment;Mother's request;Primapara;1st time breastfeeding;Infant < 6lbs;Late-preterm 34-36.6wks;NICU baby Chronic HBP Age:30 days   LC called to visit with P1 Mom from [facility]. Mom had originally decided to formula feed baby, but has since decided to try breastfeeding/breast milk pumping.  [place] set up DEBP and assisted Mom to pump both breasts on initiation setting for first time.   Mom instructed on breast massage and hand expression as well. Encouraged STS with baby when able to.  Mom instructed to pump both breasts 8 times per 24 hrs.  Instructed Mom on milk storage, bottle provided.   Mom instructed on how to disassemble pump parts, wash, rinse and air dry parts in separate bins provided.  Mom doesn't have a DEBP at home, but does have [place].  [facility] referral faxed with permission.  Mom shown how to assemble hand pump from kit as well.    NICU booklet and lactation brochure provided to Mom.  Mom denies any questions currently.    Lactation Tools Discussed/Used Tools: Pump Breast pump type: Double-Electric Breast Pump Pump Education: Setup, frequency, and cleaning;Milk Storage Reason for Pumping: support milk supply/LPTI in NICU Pumping frequency: Mom assisted to pump for first time. Pumped volume: 0 mL  Interventions Interventions: Breast feeding basics reviewed;Skin to skin;Breast massage;Hand express;DEBP;Education  Discharge Pump: DEBP WIC Program: Yes  Consult Status Consult Status: Follow-up Date: 06/22/20 Follow-up type: In-patient    Broadus John 06/21/2020, 12:39 PM

## 2020-06-22 ENCOUNTER — Ambulatory Visit: Payer: Self-pay

## 2020-06-22 NOTE — Lactation Note (Signed)
This note was copied from a baby's chart. Lactation Consultation Note  Patient Name: Brianna Lee IZTIW'P Date: 06/22/2020   Age:30 days  RN will notify me if Mom arrives before 3:30 and if she would like to see Lactation.  Lurline Hare [facility] 06/22/2020, 10:15 AM

## 2020-06-23 ENCOUNTER — Encounter: Payer: Medicaid Other | Admitting: Obstetrics and Gynecology

## 2020-06-23 ENCOUNTER — Ambulatory Visit: Payer: Medicaid Other

## 2020-06-23 ENCOUNTER — Other Ambulatory Visit: Payer: Medicaid Other

## 2020-06-24 ENCOUNTER — Ambulatory Visit (INDEPENDENT_AMBULATORY_CARE_PROVIDER_SITE_OTHER): Payer: Medicaid Other | Admitting: *Deleted

## 2020-06-24 ENCOUNTER — Other Ambulatory Visit: Payer: Self-pay

## 2020-06-24 VITALS — BP 138/82 | HR 132 | Temp 98.8°F | Wt 284.6 lb

## 2020-06-24 DIAGNOSIS — O165 Unspecified maternal hypertension, complicating the puerperium: Secondary | ICD-10-CM

## 2020-06-24 DIAGNOSIS — Z4889 Encounter for other specified surgical aftercare: Secondary | ICD-10-CM

## 2020-06-24 NOTE — Progress Notes (Signed)
   Subjective:     Brianna Lee is a 30 y.o. female who presents to the clinic 1 weeks status post operative cesarean for cesarean . Eating a regular diet without difficulty. Bowel movements are normal. Pain is controlled with current analgesics. Medications being used: narcotic analgesics including Oxycodone.  The following portions of the patient's history were reviewed and updated as appropriate: allergies, current medications, past medical history, past surgical history and problem list.  Review of Systems Pertinent items are noted in HPI.    Objective:    BP 138/82 (BP Location: Left Arm, Patient Position: Sitting, Cuff Size: Large)   Pulse (!) 132   Temp 98.8 F (37.1 C) (Oral)   Wt 284 lb 9.6 oz (129.1 kg)   LMP 09/23/2019   Breastfeeding No   BMI 48.85 kg/m  General:  alert, cooperative, appears stated age and moderately obese  Abdomen: soft, lower abdominal area tender to touch  Incision:   healing well, no drainage, no erythema, no hernia, no seroma, no swelling, small skin tears less than 1 inch due to adhesive tape, incision well approximated.      Assessment:    Postoperative course complicated by pain.  Patient had Prevena in placed. Area prepped with betadine/alcohol swab for cleaning. Prevena did not have any drainage. Deviced removed with cleaning wipes (alcohol/betadine/normal saline).    Plan:    1. Continue any current medications. 2. Wound care discussed. 3. Activity restrictions: no lifting more than 15-20 pounds 4. Anticipated return to work: not applicable. 5. Follow up: 5 weeks for postpartum.   Clovis Pu, RN

## 2020-06-27 ENCOUNTER — Ambulatory Visit: Payer: Self-pay

## 2020-06-27 NOTE — Lactation Note (Signed)
This note was copied from a baby's chart. Lactation Consultation Note  Patient Name: Boy Hanifah Royse SFSEL'T Date: 06/27/2020   Age:30 days  Mother is no longer providing pumped milk. LC services are complete.   Consult Status Consult Status: Complete  Elder Negus, MA IBCLC 06/27/2020, 10:52 AM

## 2020-06-29 ENCOUNTER — Other Ambulatory Visit: Payer: Medicaid Other

## 2020-06-29 ENCOUNTER — Ambulatory Visit: Payer: Medicaid Other

## 2020-07-01 ENCOUNTER — Ambulatory Visit (INDEPENDENT_AMBULATORY_CARE_PROVIDER_SITE_OTHER): Payer: Medicaid Other | Admitting: *Deleted

## 2020-07-01 ENCOUNTER — Other Ambulatory Visit: Payer: Self-pay

## 2020-07-01 VITALS — BP 122/87 | HR 115 | Temp 99.3°F | Wt 281.0 lb

## 2020-07-01 DIAGNOSIS — Z4889 Encounter for other specified surgical aftercare: Secondary | ICD-10-CM

## 2020-07-01 DIAGNOSIS — Z98891 History of uterine scar from previous surgery: Secondary | ICD-10-CM

## 2020-07-01 NOTE — Progress Notes (Signed)
   Subjective:     Brianna Lee is a 30 y.o. female who presents to the clinic 2 weeks status post cesarean delivery for birth of child. Eating a regular diet without difficulty. Bowel movements are normal. Pain is controlled without any medications.  The following portions of the patient's history were reviewed and updated as appropriate: allergies, current medications, past surgical history and problem list.  Review of Systems Pertinent items are noted in HPI.    Objective:    BP 122/87 (BP Location: Left Arm, Patient Position: Sitting, Cuff Size: Large)   Pulse (!) 115   Temp 99.3 F (37.4 C) (Oral)   Wt 281 lb (127.5 kg)   LMP 09/23/2019   Breastfeeding No   BMI 48.23 kg/m  General:  alert, cooperative, appears stated age and no distress  Abdomen: soft  Incision:   Wound area was covered with brownish, sticky substance. Area cleaned with normal saline. healing well, no drainage, no erythema, no hernia, no seroma, no swelling, well approximated, there was small area what was open, provider Raelyn Mora, CNM applied Silver Nitrate. Abdominal pad placed on the area.     Assessment:    Doing well postoperatively.   Plan:    1. Continue any current medications. 2. Wound care discussed. 3. Activity restrictions: no lifting more than 15-20 pounds 4. Anticipated return to work: not applicable. 5. Follow up: 4 weeks for postpartum.   Clovis Pu, RN

## 2020-07-06 ENCOUNTER — Ambulatory Visit: Payer: Medicaid Other

## 2020-07-29 ENCOUNTER — Ambulatory Visit (INDEPENDENT_AMBULATORY_CARE_PROVIDER_SITE_OTHER): Payer: Medicaid Other | Admitting: Obstetrics and Gynecology

## 2020-07-29 ENCOUNTER — Other Ambulatory Visit: Payer: Self-pay

## 2020-07-29 ENCOUNTER — Encounter: Payer: Self-pay | Admitting: Obstetrics and Gynecology

## 2020-07-29 DIAGNOSIS — B372 Candidiasis of skin and nail: Secondary | ICD-10-CM

## 2020-07-29 MED ORDER — NYSTATIN 100000 UNIT/GM EX POWD: 1.0000 "application " | Freq: Three times a day (TID) | CUTANEOUS | 0 refills | Status: DC

## 2020-07-29 NOTE — Progress Notes (Signed)
Post Partum Visit Note  Brianna Lee is a 30 y.o. [phone] female who presents for a postpartum visit. She is 4 weeks following a c-section delivery.  I have fully reviewed the prenatal and intrapartum course. The delivery was at 34.6 gestational weeks.  Anesthesia: Epidual. Postpartum course has been uncomplicated. Baby is doing well. Baby is feeding by bottle. Bleeding none at this time. Bowel function is normal. Bladder function is normal. Patient is not sexually active. Contraception method is IUD. Postpartum depression screening: negative   The pregnancy intention screening data noted above was reviewed. Potential methods of contraception were discussed. The patient elected to proceed with IUD or IUS.     Health Maintenance Due  Topic Date Due  . COVID-19 Vaccine (3 - Booster for Moderna series) 06/08/2020    The following portions of the patient's history were reviewed and updated as appropriate: allergies, current medications, past family history, past medical history, past social history, past surgical history and problem list.  Review of Systems Constitutional: negative Eyes: negative Ears, nose, mouth, throat, and face: negative Respiratory: negative Cardiovascular: negative Gastrointestinal: negative Genitourinary:negative Integument/breast: positive for redness in skinfold of pannus - applying Vaseline to that area Hematologic/lymphatic: negative Musculoskeletal:negative Neurological: negative Behavioral/Psych: negative Endocrine: negative Allergic/Immunologic: negative  Objective:  BP 137/89   Pulse 99   Wt 282 lb 3.2 oz (128 kg)   LMP 09/23/2019   BMI 48.44 kg/m    General:  alert, cooperative, fatigued and moderately obese   Breasts:  normal  Lungs: clear to auscultation bilaterally  Heart:  regular rate and rhythm, S1, S2 normal, no murmur, click, rub or gallop  Abdomen: soft, non-tender; bowel sounds normal; no masses,  no organomegaly   Wound well  approximated incision, surrounding skin reddened  GU exam:  normal  Speculum Exam: IUD strings visualized and trimmed to 3 cm, tucked behind cervix       Assessment:   Encounter for postpartum visit - Normal postpartum exam.   Cutaneous candidiasis  - Rx for nystatin (MYCOSTATIN/NYSTOP) powder TID  Plan:   Essential components of care per ACOG recommendations:  1.  Mood and well being: Patient with negative depression screening today. Reviewed local resources for support.  - Patient does not use tobacco.  - hx of drug use? No  .   2. Infant care and feeding:  -Patient currently breastmilk feeding? No  -Social determinants of health (SDOH) reviewed in EPIC. No concerns  3. Sexuality, contraception and birth spacing - Patient does not want a pregnancy in the next year.  Desired family size is unsure of the number of children.  - Reviewed forms of contraception in tiered fashion. Patient desired IUD today.   - Discussed birth spacing of 18 months  4. Sleep and fatigue -Encouraged family/partner/community support of 4 hrs of uninterrupted sleep to help with mood and fatigue  5. Physical Recovery  - Discussed patients delivery and complications - Patient had a C-section for severe PEC superimposed on cHTN, severe IUGR and breech presentation. Perineal healing reviewed. Patient expressed understanding - Patient has urinary incontinence? No  - Patient is safe to resume physical and sexual activity  6.  Health Maintenance - HM due items addressed Yes - Last pap smear done 09/25/2020 and was normal with negative HPV. Pap smear not done at today's visit.  Mammogram due at age 51  7. Chronic Disease - cHTN >> advised to schedule appointment with [facility] to manage    [facility]  Arita Miss, CNM [facility] for [employer], [facility]

## 2020-08-09 DIAGNOSIS — S63502A Unspecified sprain of left wrist, initial encounter: Secondary | ICD-10-CM | POA: Diagnosis not present

## 2020-08-09 DIAGNOSIS — S63602A Unspecified sprain of left thumb, initial encounter: Secondary | ICD-10-CM | POA: Diagnosis not present

## 2020-09-01 DIAGNOSIS — H109 Unspecified conjunctivitis: Secondary | ICD-10-CM | POA: Diagnosis not present

## 2020-10-12 ENCOUNTER — Encounter (INDEPENDENT_AMBULATORY_CARE_PROVIDER_SITE_OTHER): Payer: Self-pay | Admitting: Primary Care

## 2020-10-12 ENCOUNTER — Other Ambulatory Visit: Payer: Self-pay

## 2020-10-12 ENCOUNTER — Ambulatory Visit (INDEPENDENT_AMBULATORY_CARE_PROVIDER_SITE_OTHER): Payer: Medicaid Other | Admitting: Primary Care

## 2020-10-12 DIAGNOSIS — Z7689 Persons encountering health services in other specified circumstances: Secondary | ICD-10-CM | POA: Diagnosis not present

## 2020-10-12 DIAGNOSIS — F4323 Adjustment disorder with mixed anxiety and depressed mood: Secondary | ICD-10-CM | POA: Diagnosis not present

## 2020-10-12 DIAGNOSIS — E878 Other disorders of electrolyte and fluid balance, not elsewhere classified: Secondary | ICD-10-CM | POA: Diagnosis not present

## 2020-10-12 NOTE — Progress Notes (Signed)
New Patient Office Visit  Subjective:  Patient ID: Brianna Lee, female    DOB: May 02, 1990  Age: 30 y.o. MRN: [id number]  CC: No chief complaint on file.   HPI [name] is a 30 year old obese female who presents for establishment of care and concerns with anxiety and depression. She has recently had a son who is 48 months old and feels he tolerance is very little and frustrated with generalized situations. She does have help with her son. First may need a mommy break, 2nd we will discuss other options.   Past Medical History:  Diagnosis Date   BMI 45.0-49.9, adult ([place]) 05/13/2020   COVID-19 affecting pregnancy in second trimester 04/29/2020   Scoliosis     Past Surgical History:  Procedure Laterality Date   CESAREAN SECTION N/A 06/17/2020   Procedure: CESAREAN SECTION;  Surgeon: Osborne Oman, MD;  Location: [facility];  Service: Obstetrics;  Laterality: N/A;   GALLBLADDER SURGERY      Family History  Problem Relation Age of Onset   Hypertension Father    Diabetes Father     Social History   Socioeconomic History   Marital status: Single    Spouse name: Not on file   Number of children: Not on file   Years of education: Not on file   Highest education level: High school graduate  Occupational History   Not on file  Tobacco Use   Smoking status: Former    Pack years: 0.00    Types: Cigarettes   Smokeless tobacco: Never   Tobacco comments:    2 cigarettes a day   Vaping Use   Vaping Use: Never used  Substance and Sexual Activity   Alcohol use: Not Currently   Drug use: Never   Sexual activity: Yes    Birth control/protection: None  Other Topics Concern   Not on file  Social History Narrative   Not on file   Social Determinants of Health   Financial Resource Strain: Low Risk    Difficulty of Paying Living Expenses: Not hard at all  Food Insecurity: No Food Insecurity   Worried About Charity fundraiser in the Last Year: Never true   [place]  in the Last Year: Never true  Transportation Needs: No Transportation Needs   Lack of Transportation (Medical): No   Lack of Transportation (Non-Medical): No  Physical Activity: Sufficiently Active   Days of Exercise per Week: 4 days   Minutes of Exercise per Session: 40 min  Stress: No Stress Concern Present   Feeling of Stress : Not at all  Social Connections: Moderately Integrated   Frequency of Communication with Friends and Family: More than three times a week   Frequency of Social Gatherings with Friends and Family: Twice a week   Attends Religious Services: More than 4 times per year   Active Member of [employer] or Organizations: No   Attends Archivist Meetings: Never   Marital Status: Living with partner  Intimate Partner Violence: Not At Risk   Fear of Current or Ex-Partner: No   Emotionally Abused: No   Physically Abused: No   Sexually Abused: No    ROS Review of Systems  Psychiatric/Behavioral:  Positive for agitation and sleep disturbance. The patient is nervous/anxious.        Depression  All other systems reviewed and are negative.  Objective:   Today's Vitals: There were no vitals taken for this visit.  Physical  Exam Vitals reviewed.  Constitutional:      Comments: morbid  HENT:     Head: Normocephalic.     Right Ear: Tympanic membrane and external ear normal.     Left Ear: Tympanic membrane and external ear normal.     Nose: Nose normal.  Eyes:     Extraocular Movements: Extraocular movements intact.     Pupils: Pupils are equal, round, and reactive to light.  Cardiovascular:     Rate and Rhythm: Normal rate and regular rhythm.  Pulmonary:     Effort: Pulmonary effort is normal.     Breath sounds: Normal breath sounds.  Abdominal:     General: Bowel sounds are normal. There is distension.     Palpations: Abdomen is soft.  Musculoskeletal:        General: Normal range of motion.     Cervical back: Normal range of motion.  Skin:     General: Skin is warm and dry.  Neurological:     Mental Status: She is alert and oriented to person, place, and time.  Psychiatric:        Behavior: Behavior normal.        Thought Content: Thought content normal.        Judgment: Judgment normal.   Assessment & Plan:   [name] was seen today for establish care.  Diagnoses and all orders for this visit:  Morbid obesity ([place]) Rule out thyroid levels being abnormal for change in mood, increased anxiety.  Also risk of cardiovascular disease, diabetes or respiratory complications.  Recommend low carbohydrate diet and exercising which will promote healthier me/her  -     TSH + free T4  Adjustment disorder with mixed anxiety and depressed mood Abnormal  electrolytes and calcium will rule out other causative factors before prescribing antidepressant including follow-up with clinical social worker -     TSH + free T4  Encounter to establish care Establish care with new PCP  Electrolyte imbalance -     CMP14+EGFR -     CBC with Differential   Hypocalcemia Reviewed in previous labs will check  levels vitamin D levels  Outpatient Encounter Medications as of 10/12/2020  Medication Sig   Blood Pressure Monitoring (BLOOD PRESSURE MONITOR AUTOMAT) DEVI 1 Device by Does not apply route daily. Automatic blood pressure cuff regular size. To monitor blood pressure regularly at home. ICD-10 code:Z34.90   Elastic Bandages & Supports (COMFORT FIT MATERNITY SUPP SM) MISC 1 Units by Does not apply route daily as needed.   ferrous sulfate 325 (65 FE) MG tablet Take 1 tablet (325 mg total) by mouth every other day.   NIFEdipine (ADALAT CC) 30 MG 24 hr tablet Take 1 tablet (30 mg total) by mouth daily.   nystatin (MYCOSTATIN/NYSTOP) powder Apply 1 application topically 3 (three) times daily.   oxyCODONE-acetaminophen (PERCOCET/ROXICET) 5-325 MG tablet Take 1-2 tablets by mouth every 4 (four) hours as needed for severe pain ((when tolerating fluids)).    polyethylene glycol powder (MIRALAX) 17 GM/SCOOP powder Take 17 g by mouth daily. Take one tablespoon once daily for constipation   Prenatal Vit-Fe Fumarate-FA (PREPLUS) 27-1 MG TABS Take 1 tablet by mouth daily.   simethicone (MYLICON) 80 MG chewable tablet Chew 1 tablet (80 mg total) by mouth as needed for flatulence.   No facility-administered encounter medications on file as of 10/12/2020.    Follow-up: No follow-ups on file.   Kerin Perna, NP

## 2020-10-12 NOTE — Patient Instructions (Signed)
Depression can affect your thoughts and feelings, relationships, daily activities, and physical health. It is caused by changes in the way your brain functions. If you receive a diagnosis of depression, your health care provider will tell you which type of depression you have and what treatment options are available to you.  We discussed options for treatment of anxiety including therapy and/or medication. Will check basic labs to ensure thyroid is in normal range and that no other metabolic issues are obvious. Reviewed concept of anxiety as biochemical imbalance of neurotransmitters and rationale for treatment. Discussed potential risks, expected benefits, possible side effects of the medicine. We also discussed how to take it correctly and dosing instructions. If she has any significant side effects to the medicine, she is to stop it and call for advice. Instructed patient to contact office or on-call physician promptly should condition worsen or any new symptoms appear.

## 2020-10-12 NOTE — Progress Notes (Signed)
Concerns with irritability and anxiety.   "may have bipolar"

## 2020-10-13 LAB — CBC WITH DIFFERENTIAL/PLATELET
Basophils Absolute: 0 10*3/uL (ref 0.0–0.2)
Basos: 1 %
EOS (ABSOLUTE): 0 10*3/uL (ref 0.0–0.4)
Eos: 0 %
Hematocrit: 44.5 % (ref 34.0–46.6)
Hemoglobin: 14 g/dL (ref 11.1–15.9)
Immature Grans (Abs): 0 10*3/uL (ref 0.0–0.1)
Immature Granulocytes: 0 %
Lymphocytes Absolute: 2.1 10*3/uL (ref 0.7–3.1)
Lymphs: 39 %
MCH: 24.6 pg — ABNORMAL LOW (ref 26.6–33.0)
MCHC: 31.5 g/dL (ref 31.5–35.7)
MCV: 78 fL — ABNORMAL LOW (ref 79–97)
Monocytes Absolute: 0.6 10*3/uL (ref 0.1–0.9)
Monocytes: 10 %
Neutrophils Absolute: 2.7 10*3/uL (ref 1.4–7.0)
Neutrophils: 50 %
Platelets: 359 10*3/uL (ref 150–450)
RBC: 5.7 x10E6/uL — ABNORMAL HIGH (ref 3.77–5.28)
RDW: 16.1 % — ABNORMAL HIGH (ref 11.7–15.4)
WBC: 5.4 10*3/uL (ref 3.4–10.8)

## 2020-10-13 LAB — TSH+FREE T4
Free T4: 0.9 ng/dL (ref 0.82–1.77)
TSH: 1.11 u[IU]/mL (ref 0.450–4.500)

## 2020-10-13 LAB — CMP14+EGFR
ALT: 24 IU/L (ref 0–32)
AST: 15 IU/L (ref 0–40)
Albumin/Globulin Ratio: 1.4 (ref 1.2–2.2)
Albumin: 4 g/dL (ref 3.9–5.0)
Alkaline Phosphatase: 104 IU/L (ref 44–121)
BUN/Creatinine Ratio: 10 (ref 9–23)
BUN: 9 mg/dL (ref 6–20)
Bilirubin Total: 0.2 mg/dL (ref 0.0–1.2)
CO2: 23 mmol/L (ref 20–29)
Calcium: 9.4 mg/dL (ref 8.7–10.2)
Chloride: 102 mmol/L (ref 96–106)
Creatinine, Ser: 0.87 mg/dL (ref 0.57–1.00)
Globulin, Total: 2.9 g/dL (ref 1.5–4.5)
Glucose: 78 mg/dL (ref 65–99)
Potassium: 4.3 mmol/L (ref 3.5–5.2)
Sodium: 139 mmol/L (ref 134–144)
Total Protein: 6.9 g/dL (ref 6.0–8.5)
eGFR: 92 mL/min/{1.73_m2} (ref >59)

## 2020-10-13 LAB — VITAMIN D 25 HYDROXY (VIT D DEFICIENCY, FRACTURES): Vit D, 25-Hydroxy: 12.6 ng/mL — ABNORMAL LOW (ref 30.0–100.0)

## 2020-10-14 ENCOUNTER — Ambulatory Visit: Payer: Medicaid Other | Admitting: Obstetrics and Gynecology

## 2020-10-20 ENCOUNTER — Other Ambulatory Visit: Payer: Self-pay

## 2020-10-20 ENCOUNTER — Ambulatory Visit: Payer: Medicaid Other | Attending: Primary Care | Admitting: Clinical

## 2020-10-20 ENCOUNTER — Ambulatory Visit (INDEPENDENT_AMBULATORY_CARE_PROVIDER_SITE_OTHER): Payer: Medicaid Other | Admitting: Obstetrics and Gynecology

## 2020-10-20 ENCOUNTER — Encounter: Payer: Self-pay | Admitting: Obstetrics and Gynecology

## 2020-10-20 VITALS — BP 95/65 | HR 102 | Temp 98.4°F | Ht 65.0 in | Wt 276.2 lb

## 2020-10-20 DIAGNOSIS — Z0000 Encounter for general adult medical examination without abnormal findings: Secondary | ICD-10-CM

## 2020-10-20 DIAGNOSIS — F331 Major depressive disorder, recurrent, moderate: Secondary | ICD-10-CM | POA: Diagnosis not present

## 2020-10-20 DIAGNOSIS — R238 Other skin changes: Secondary | ICD-10-CM | POA: Diagnosis not present

## 2020-10-20 DIAGNOSIS — F411 Generalized anxiety disorder: Secondary | ICD-10-CM | POA: Diagnosis not present

## 2020-10-20 NOTE — Progress Notes (Signed)
   WELL-WOMAN PHYSICAL & PAP Patient name: Brianna Lee MRN [id number]  Date of birth: 02/25/1991 Chief Complaint:   Gynecologic Exam  History of Present Illness:   Brianna Lee is a 30 y.o. [id number] African American female being seen today for a routine well-woman exam.  Current complaints: she thinks she may have a rash or some type of irritation on the LT side of her abdomen.  PCP: none      does not desire labs No LMP recorded (lmp unknown). (Menstrual status: IUD). The current method of family planning is IUD.  Last pap 09/26/2019. Results were: normal Last mammogram: n/a. Results were:  n/a . Family h/o breast cancer: No Last colonoscopy: n/a. Results were:  n/a . Family h/o colorectal cancer: No Review of Systems:   Pertinent items are noted in HPI Denies any headaches, blurred vision, fatigue, shortness of breath, chest pain, abdominal pain, abnormal vaginal discharge/itching/odor/irritation, problems with periods, bowel movements, urination, or intercourse unless otherwise stated above. Pertinent History Reviewed:  Reviewed past medical,surgical, social and family history.  Reviewed problem list, medications and allergies. Physical Assessment:   Vitals:   10/20/20 1353  BP: 95/65  Pulse: (!) 102  Temp: 98.4 F (36.9 C)  TempSrc: Oral  Weight: 276 lb 3.2 oz (125.3 kg)  Height: 5\' 5"  (1.651 m)  Body mass index is 45.96 kg/m.        Physical Examination:   General appearance - well appearing, no distress and fatigued  Mental status - alert, oriented to person, place, and time  Psych:  She has a normal mood and affect  Skin - warm and dry, normal color, no suspicious lesions noted  Chest - effort normal, all lung fields clear to auscultation bilaterally  Heart - normal rate and regular rhythm  Neck:  midline trachea, no thyromegaly or nodules  Breasts - breasts appear normal, no suspicious masses, no skin or nipple changes or  axillary nodes  Abdomen - soft, nontender,  nondistended, no masses or organomegaly - adhesive material found in skin fold from C/S, adhesive remover used to remove remaining residue - patient tolerated well  Pelvic - deferred  Rectal - deferred  Extremities:  No swelling or varicosities noted  No results found for this or any previous visit (from the past 24 hour(s)).  Assessment & Plan:  1) Encounter for well woman exam without gynecological exam  2) Skin irritation - Advised to apply 1% hydrocortisone cream, if feel bumps return in the skin folds of abdomen - Advised to keep area clean and dry at all times  Labs/procedures today: none  Mammogram at age 56 or sooner if problems Colonoscopy at age 19 or sooner if problems  No orders of the defined types were placed in this encounter.   Meds: No orders of the defined types were placed in this encounter.   Follow-up: Return in about 1 year (around 10/20/2021) for Annual Exam.  12/21/2021 MSN, CNM 10/20/2020 2:11 PM

## 2020-10-21 NOTE — BH Specialist Note (Signed)
3Integrated Behavioral Health Initial In-Person Visit  MRN: [id number] Name: Brianna Lee  Number of Integrated Behavioral Health Clinician visits:: 1/6 Session Start time: 9:50AM  Session End time: 10:50AM Total time: 60 minutes  Types of Service: Individual psychotherapy  Interpretor:No. Interpretor Name and Language: N/A   Warm Hand Off Completed.         Subjective: Brianna Lee is a 30 y.o. female accompanied by  self Patient was referred by PCP Gwinda Passe for mood and anxiety. Patient reports the following symptoms/concerns: Pt reports excessive worrying, difficulty sleeping, anxiousness, difficulty relaxing, irritability, decreased self-esteem, feeling depressed, decreased energy, fidgeting, and racing thoughts.  Duration of problem: 1+year; Severity of problem: moderate  Objective: Mood: Anxious, Depressed, and Worthless and Affect: Appropriate and Tearful Risk of harm to self or others: No plan to harm self or others  Life Context: Family and Social: Reports that she has 12 month old son and currently lives with her boyfriend. Reports that she was raised by her grandparents and her grandfather passed away in 08/02/19. Reports that she has a strained relationship with her parents.  School/Work: Pt currently works full-time. Self-Care: Reports limited self-care activities. No substance use.  Life Changes: Pt recently gave birth 4 months ago. Pt's grandfather also passed away over a year ago.  Pt's father has recently been more involved in her life since she gave birth.  Patient and/or Family's Strengths/Protective Factors: Social connections, Social and Patent attorney, Concrete supports in place (healthy food, safe environments, etc.), Sense of purpose, Caregiver has knowledge of parenting & child development, and Parental Resilience  Goals Addressed: Patient will: Reduce symptoms of: agitation, anxiety, depression, mood instability, and stress Increase knowledge  and/or ability of: coping skills, self-management skills, and stress reduction  Demonstrate ability to: Increase healthy adjustment to current life circumstances and Begin healthy grieving over loss  Progress towards Goals: Ongoing  Interventions: Interventions utilized: Mindfulness or Management consultant, CBT Cognitive Behavioral Therapy, Supportive Counseling, Sleep Hygiene, and Psychoeducation and/or Health Education  Standardized Assessments completed:  MDQ, ASRS, GAD-7, and PHQ 9  Patient and/or Family Response: Pt receptive to plan of tx.   Patient Centered Plan: Patient is on the following Treatment Plan(s):  Pt will fu with LCSW and will consider medication if she does not notice improvement in mood and anxiety.   Assessment: Pt presents with anxious and depressed mood with an appropriate and tearful affect. Denies SI/HI. No auditory/visual hallucinations. No safety risks. Reports a family hx of bipolar disorder. Pt reports that she is often overwhelmed, anxious, and depressed daily. Expressed that she feels irritable, worthless with decreased self-esteem, and no interest in daily activities, and difficulty concentrating. Expressed worries that her mood may be affecting her relationship due to her irritability. Reports that she has felt depressed for over a year. Reports that her grandfather passed away over a year ago and she believes that she is still grieving. Expressed that she was raised by her grandparents and has a strained relationship with her mother. Pt reports a significant trauma hx. Pt reports that she often questions her worth due to her mother never having custody of her but having custody of her younger brother. Expressed that her father was inconsistent throughout life. Expressed that she has frequently felt invalidated by her parents and as a result she withholds her feelings. Patient currently experiencing depression and anxiety. Pt's depression is heightened by postpartum  but began prior to her giving birth.    Patient may benefit from therapy. Pt  would benefit from TFCBT and mindfulness. Pt expressed that she would consider medication management and would like to fu with LCSW.  Plan: Follow up with behavioral health clinician on : 7/20/222 Behavioral recommendations: Utilize deep breathing exercises, utilize meditation, utilize journal Referral(s): Integrated [employer] (In Clinic) "From scale of 1-10, how likely are you to follow plan?": 10  Gerilyn Stargell C Reis Goga, LCSW

## 2020-11-03 ENCOUNTER — Ambulatory Visit: Payer: Medicaid Other | Attending: Family Medicine | Admitting: Clinical

## 2020-11-03 ENCOUNTER — Other Ambulatory Visit: Payer: Self-pay

## 2020-11-03 DIAGNOSIS — F411 Generalized anxiety disorder: Secondary | ICD-10-CM

## 2020-11-03 DIAGNOSIS — F331 Major depressive disorder, recurrent, moderate: Secondary | ICD-10-CM

## 2020-11-04 NOTE — BH Specialist Note (Signed)
Integrated Behavioral Health Follow Up In-Person Visit  MRN: [id number] Name: Brianna Lee  Number of Integrated Behavioral Health Clinician visits: 2/6 Session Start time: 9:35 AM  Session End time: 10:35 AM Total time: 60 minutes  Types of Service: Individual psychotherapy  Interpretor:No. Interpretor Name and Language: N/A  Subjective: Brianna Lee is a 30 y.o. female accompanied by  Self Patient was referred by PCP Edwards for mood and anxiety. . Patient reports the following symptoms/concerns: Pt reports excessive worrying, difficulty sleeping, anxiousness, difficulty relaxing, irritability, decreased self-esteem, feeling depressed, decreased energy, fidgeting, and racing thoughts. Duration of problem: 1+ year; Severity of problem: moderate  Objective: Mood: Anxious, Depressed, and Worthless and Affect: Appropriate Risk of harm to self or others: No plan to harm self or others  Life Context: Family and Social: Reports that she has 41 month old son and currently lives with her boyfriend. Reports that she was raised by her grandparents and her grandfather passed away in 2019-07-31. Reports that she has a strained relationship with her parents.  School/Work: Pt currently works full-time. Self-Care: Reports limited self-care activities. No substance use.  Life Changes: Pt recently gave birth 4 months ago. Pt's grandfather also passed away over a year ago.  Pt's father has recently been more involved in her life since she gave birth.  Patient and/or Family's Strengths/Protective Factors: Social connections, Social and Patent attorney, Concrete supports in place (healthy food, safe environments, etc.), Sense of purpose, and Caregiver has knowledge of parenting & child development  Goals Addressed: Patient will:  Reduce symptoms of: agitation, anxiety, depression, mood instability, and stress   Increase knowledge and/or ability of: coping skills, self-management skills, and stress reduction    Demonstrate ability to: Increase healthy adjustment to current life circumstances and Begin healthy grieving over loss  Progress towards Goals: Ongoing  Interventions: Interventions utilized:  Mindfulness or Management consultant, CBT Cognitive Behavioral Therapy, Supportive Counseling, Sleep Hygiene, and Psychoeducation and/or Health Education Standardized Assessments completed: GAD-7 and PHQ 9  Patient and/or Family Response: Pt receptive to tx.  Patient Centered Plan: Patient is on the following Treatment Plan(s):  Pt will fu with LCSW and will consider medication if she does not notice improvement in mood and anxiety.   Assessment: Pt presents with anxious and depressed mood with an appropriate affect. Denies SI/HI. No safety risks. Pt reports that she continues to utilize meditation and deep breathing exercises. Reports that she began journalling and noticed a decrease in her irritability. Reports that sh continues to question her parenting and worries about being a "good enough" mother. Reports that she is tearful at times and tries to prevent her son from seeing her cry. Reports that she continues to have a strained relationship with her mother and wants their relationship to improve. Reports that her relationship with her father is continuing to improve. Patient currently experiencing anxiety and depression. Pt also experience a stress response to trauma due to childhood relationship with her mother.    Patient advised to utilize healthy coping skills. Pt also encouraged to create a music playlist to increase mood. Pt plans to disclose trauma further in next session. LCSW will fu with pt.   Plan: Follow up with behavioral health clinician on : 11/17/20 Behavioral recommendations: Utilize deep breathing exercises, utilize meditation, continue writing in journal, create music playlist to increase mood Referral(s): Integrated [employer] (In Clinic) "From scale of 1-10, how  likely are you to follow plan?": 10  Ela Moffat C Ari Engelbrecht, LCSW

## 2020-11-08 ENCOUNTER — Other Ambulatory Visit (INDEPENDENT_AMBULATORY_CARE_PROVIDER_SITE_OTHER): Payer: Self-pay

## 2020-11-09 ENCOUNTER — Telehealth (INDEPENDENT_AMBULATORY_CARE_PROVIDER_SITE_OTHER): Payer: Medicaid Other | Admitting: Primary Care

## 2020-11-09 ENCOUNTER — Encounter (INDEPENDENT_AMBULATORY_CARE_PROVIDER_SITE_OTHER): Payer: Self-pay | Admitting: Primary Care

## 2020-11-09 DIAGNOSIS — Z98891 History of uterine scar from previous surgery: Secondary | ICD-10-CM

## 2020-11-09 DIAGNOSIS — G8929 Other chronic pain: Secondary | ICD-10-CM | POA: Diagnosis not present

## 2020-11-09 DIAGNOSIS — M549 Dorsalgia, unspecified: Secondary | ICD-10-CM

## 2020-11-09 NOTE — Progress Notes (Signed)
[facility]  Virtual Visit via Telephone Note  I connected with Idolina Mantell, on 11/09/2020 at 11:00 AM through telephone and verified that I am speaking with the correct person using two identifiers.   Consent: I discussed the limitations, risks, security and privacy concerns of performing an evaluation and management service by telephone and the availability of in person appointments. I also discussed with the patient that there may be a patient responsible charge related to this service. The patient expressed understanding and agreed to proceed.   Location of Patient: Home   Location of Provider: [facility]   Persons participating in Telemedicine visit: Emmylou Rosalita Levan,  NP  History of Present Illness: Mrs. Providence Crosby is a 30 year old female that is having a virtual visit today to establish care and her chief complaint is hurting where her C-section incision is when she begins, lives, walk up stairs, or any vigorous exercise of movement.  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 Automatic blood pressure cuff regular size. To monitor blood pressure regularly at home. ICD-10 code:Z34.90 1 each 0   Elastic Bandages & Supports (COMFORT FIT MATERNITY SUPP SM) MISC 1 Units by Does not apply route daily as needed. (Patient not taking: Reported on 10/20/2020) 1 each 0   ferrous sulfate 325 (65 FE) MG tablet Take 1 tablet (325 mg total) by mouth every  other day. 21 tablet 0   NIFEdipine (ADALAT CC) 30 MG 24 hr tablet Take 1 tablet (30 mg total) by mouth daily. 42 tablet 0   nystatin (MYCOSTATIN/NYSTOP) powder Apply 1 application topically 3 (three) times daily. (Patient not taking: Reported on 10/20/2020) 30 g 0   oxyCODONE-acetaminophen (PERCOCET/ROXICET) 5-325 MG tablet Take 1-2 tablets by mouth every 4 (four) hours as needed for severe pain ((when tolerating fluids)). 30 tablet 0   polyethylene glycol powder (MIRALAX) 17 GM/SCOOP powder Take 17 g by mouth daily. Take one tablespoon once daily for constipation (Patient not taking: Reported on 10/20/2020) 255 g 0   Prenatal Vit-Fe Fumarate-FA (PREPLUS) 27-1 MG TABS Take 1 tablet by mouth daily. 90 tablet 3   simethicone (MYLICON) 80 MG chewable tablet Chew 1 tablet (80 mg total) by mouth as needed for flatulence. (Patient not taking: Reported on 10/20/2020) 30 tablet 0   No current facility-administered medications on file prior to visit.    Observations/Objective: LMP  (LMP Unknown)   Review of Systems  Musculoskeletal:  Positive for back pain.       Surgical site she had C section abdomen and with lifting back.  All other systems reviewed and are negative.  Assessment and Plan: Diagnoses and all orders for this visit:  S/P cesarean section, preterm Unclear why area continue to cause discomfort probable scar tissue, pulling and stretching area cause problem. 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 Don't feel/think C section and back pain are related other than strenuous activities. If continues consider ortho for evaluation.  Follow Up Instructions: prn   I discussed the assessment and treatment plan with the patient. The patient was provided an opportunity to ask questions and all were answered. The patient agreed with  the plan and demonstrated an understanding of the instructions.   The patient was advised to call back or seek an in-person evaluation if the symptoms worsen or if the condition fails to improve as anticipated.     I provided 15 minutes total of non-face-to-face time during this encounter including median intraservice time, reviewing previous notes, investigations, ordering medications, medical decision making, coordinating care and patient verbalized understanding at the end of the visit.    This note has been created with Education officer, environmental. Any transcriptional errors are unintentional.   Grayce Sessions, NP 11/09/2020, 11:00 AM

## 2020-11-17 ENCOUNTER — Encounter: Payer: Self-pay | Admitting: Clinical

## 2020-11-17 ENCOUNTER — Other Ambulatory Visit: Payer: Self-pay

## 2020-11-17 ENCOUNTER — Ambulatory Visit: Payer: Medicaid Other | Attending: Family Medicine | Admitting: Clinical

## 2020-11-17 DIAGNOSIS — F331 Major depressive disorder, recurrent, moderate: Secondary | ICD-10-CM | POA: Diagnosis not present

## 2020-11-17 DIAGNOSIS — F411 Generalized anxiety disorder: Secondary | ICD-10-CM
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
## 2020-12-01 ENCOUNTER — Other Ambulatory Visit: Payer: Self-pay

## 2020-12-01 ENCOUNTER — Ambulatory Visit: Payer: Medicaid Other | Attending: Family Medicine | Admitting: Clinical

## 2020-12-01 DIAGNOSIS — F330 Major depressive disorder, recurrent, mild: Secondary | ICD-10-CM

## 2020-12-01 DIAGNOSIS — F411 Generalized anxiety disorder: Secondary | ICD-10-CM
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
## 2020-12-10 ENCOUNTER — Other Ambulatory Visit: Payer: Self-pay

## 2020-12-10 ENCOUNTER — Ambulatory Visit (INDEPENDENT_AMBULATORY_CARE_PROVIDER_SITE_OTHER): Payer: Medicaid Other | Admitting: Primary Care

## 2020-12-10 ENCOUNTER — Encounter (INDEPENDENT_AMBULATORY_CARE_PROVIDER_SITE_OTHER): Payer: Self-pay | Admitting: Primary Care

## 2020-12-10 VITALS — BP 112/82 | HR 73 | Temp 97.5°F | Ht 65.0 in | Wt 272.2 lb

## 2020-12-10 DIAGNOSIS — G8929 Other chronic pain: Secondary | ICD-10-CM | POA: Diagnosis not present

## 2020-12-10 DIAGNOSIS — R1031 Right lower quadrant pain: Secondary | ICD-10-CM | POA: Diagnosis not present

## 2020-12-10 DIAGNOSIS — B379 Candidiasis, unspecified: Secondary | ICD-10-CM

## 2020-12-10 DIAGNOSIS — M549 Dorsalgia, unspecified: Secondary | ICD-10-CM | POA: Diagnosis not present

## 2020-12-10 NOTE — Patient Instructions (Signed)
Vaginal Yeast Infection, Adult  Vaginal yeast infection is a condition that causes vaginal discharge as well as soreness, swelling, and redness (inflammation) of the vagina. This is a common condition. Some women get this infectionfrequently. What are the causes? This condition is caused by a change in the normal balance of the yeast (candida) and bacteria that live in the vagina. This change causes an overgrowth ofyeast, which causes the inflammation. What increases the risk? The condition is more likely to develop in women who: Take antibiotic medicines. Have diabetes. Take birth control pills. Are pregnant. Douche often. Have a weak body defense system (immune system). Have been taking steroid medicines for a long time. Frequently wear tight clothing. What are the signs or symptoms? Symptoms of this condition include: White, thick, creamy vaginal discharge. Swelling, itching, redness, and irritation of the vagina. The lips of the vagina (vulva) may be affected as well. Pain or a burning feeling while urinating. Pain during sex. How is this diagnosed? This condition is diagnosed based on: Your medical history. A physical exam. A pelvic exam. Your health care provider will examine a sample of your vaginal discharge under a microscope. Your health care provider may send this sample for testing to confirm the diagnosis. How is this treated? This condition is treated with medicine. Medicines may be over-the-counter or prescription. You may be told to use one or more of the following: Medicine that is taken by mouth (orally). Medicine that is applied as a cream (topically). Medicine that is inserted directly into the vagina (suppository). Follow these instructions at home:  Lifestyle Do not have sex until your health care provider approves. Tell your sex partner that you have a yeast infection. That person should go to his or her health care provider and ask if they should also be  treated. Do not wear tight clothes, such as pantyhose or tight pants. Wear breathable cotton underwear. General instructions Take or apply over-the-counter and prescription medicines only as told by your health care provider. Eat more yogurt. This may help to keep your yeast infection from returning. Do not use tampons until your health care provider approves. Try taking a sitz bath to help with discomfort. This is a warm water bath that is taken while you are sitting down. The water should only come up to your hips and should cover your buttocks. Do this 3-4 times per day or as told by your health care provider. Do not douche. If you have diabetes, keep your blood sugar levels under control. Keep all follow-up visits as told by your health care provider. This is important. Contact a health care provider if: You have a fever. Your symptoms go away and then return. Your symptoms do not get better with treatment. Your symptoms get worse. You have new symptoms. You develop blisters in or around your vagina. You have blood coming from your vagina and it is not your menstrual period. You develop pain in your abdomen. Summary Vaginal yeast infection is a condition that causes discharge as well as soreness, swelling, and redness (inflammation) of the vagina. This condition is treated with medicine. Medicines may be over-the-counter or prescription. Take or apply over-the-counter and prescription medicines only as told by your health care provider. Do not douche. Do not have sex or use tampons until your health care provider approves. Contact a health care provider if your symptoms do not get better with treatment or your symptoms go away and then return. This information is not intended to replace   advice given to you by your health care provider. Make sure you discuss any questions you have with your healthcare provider. Document Revised: 03/24/2020 Document Reviewed: 08/20/2017 Elsevier Patient  Education  2022 [employer]  

## 2020-12-10 NOTE — Progress Notes (Signed)
[facility]   Brianna Lee, is a 30 y.o. female  FOY:[id number]  VEH:[id number]  DOB - July 31, 1990  Chief Complaint  Patient presents with   Follow-up    pain       Subjective:   Brianna Lee is a 30 y.o. female here today for a follow up visit. Patient has No headache, No chest pain,  - No Nausea, No new weakness tingling or numbness, No Cough - SOB. She has abdominal pain where surgical incision is from C section. Increase pain when standing for periods of time tenderness with washing.  No problems updated.  ALLERGIES: Allergies  Allergen Reactions   Naproxen Swelling    PAST MEDICAL HISTORY: Past Medical History:  Diagnosis Date   BMI 45.0-49.9, adult (HCC) 05/13/2020   COVID-19 affecting pregnancy in second trimester 04/29/2020   Hypertension    Scoliosis     MEDICATIONS AT HOME: Prior to Admission medications   Medication Sig Start Date End Date Taking? Authorizing Provider  Blood Pressure Monitoring (BLOOD PRESSURE MONITOR AUTOMAT) DEVI 1 Device by Does not apply route daily. Automatic blood pressure cuff regular size. To monitor blood pressure regularly at home. ICD-10 code:Z34.90 12/09/19  Yes [name], NP  NIFEdipine (ADALAT CC) 30 MG 24 hr tablet Take 1 tablet (30 mg total) by mouth daily. 06/21/20  Yes Bruce Bing, MD  ferrous sulfate 325 (65 FE) MG tablet Take 1 tablet (325 mg total) by mouth every other day. 06/20/20 08/01/20  [place] Bing, MD  Review of Systems  Musculoskeletal:  Positive for back pain.  Skin:  Positive for itching and rash.  All other systems reviewed and are negative.   Objective:   Vitals:   12/10/20 1034  BP: 112/82  Pulse: 73  Temp: (!) 97.5 F (36.4 C)  TempSrc: Temporal  SpO2: 97%  Weight: 272 lb 3.2 oz (123.5 kg)  Height: 5\' 5"  (1.651 m)   Exam General appearance : Awake, alert, not in any distress.morbid obese. Speech Clear. Not toxic looking HEENT: Atraumatic and Normocephalic, pupils  equally reactive to light and accomodation Neck: Supple, no JVD. No cervical lymphadenopathy.  Chest: Good air entry bilaterally, no added sounds  CVS: S1 S2 regular, no murmurs.  Abdomen: Bowel sounds present, Non tender and not distended with no gaurding, rigidity or rebound. Extremities: B/L Lower Ext shows no edema, both legs are warm to touch Neurology: Awake alert, and oriented X 3, CN II-XII intact, Non focal Skin: rash abdomin and groin area   Data Review Lab Results  Component Value Date   HGBA1C <4.2 (L) 12/09/2019    Assessment & Plan   Brianna Lee was seen today for follow-up.  Diagnoses and all orders for this visit:  Right lower quadrant abdominal pain Hx of C-sections felt pain was from this- on examination panties is rubbing against surgical site causing irritation    Candida infection Abdomin fold, groin area - explain yeast loves dark, moist area continue to use antifungal cream.  Morbid obesity (HCC) Obesity is 30-39 indicating an excess in caloric intake or underlining conditions. This may lead to other co-morbidities. Lifestyle modifications of diet and exercise may reduce obesity.    Other chronic back pain  BACK PAIN  Location: lumbar/sacral Quality: aching, heaviness, and pressure Onset: gradual Worse with: bending and lifting       Better with: rest Radiation: no Trauma: no Best sitting/standing/leaning forward: yes  Red Flags Fecal/urinary incontinence: no  Numbness/Weakness: no  Fever/chills/sweats: no  Night pain: no  Unexplained weight loss: no  No relief with bedrest: yes  h/o cancer/immunosuppression: no  IV drug use: no  PMH of osteoporosis or chronic steroid use: no     Patient have been counseled extensively about nutrition and exercise. Other issues discussed during this visit include: low cholesterol diet, weight control and daily exercise, foot care, annual eye examinations at Ophthalmology, importance of adherence with medications and  regular follow-up. We also discussed long term complications of uncontrolled diabetes and hypertension.   Return in about 3 months (around 03/12/2021) for weightloss, HTN, fasting labs.  The patient was given clear instructions to go to ER or return to medical center if symptoms don't improve, worsen or new problems develop. The patient verbalized understanding. The patient was told to call to get lab results if they haven't heard anything in the next week.   This note has been created with Education officer, environmental. Any transcriptional errors are unintentional.    Brianna Sessions, Np  12/12/2020, 8:28 PM

## 2020-12-22 ENCOUNTER — Ambulatory Visit: Payer: Medicaid Other | Attending: Family Medicine | Admitting: Clinical

## 2020-12-22 ENCOUNTER — Encounter: Payer: Self-pay | Admitting: Primary Care

## 2020-12-22 ENCOUNTER — Other Ambulatory Visit: Payer: Self-pay

## 2020-12-22 DIAGNOSIS — F331 Major depressive disorder, recurrent, moderate: Secondary | ICD-10-CM

## 2020-12-22 DIAGNOSIS — F411 Generalized anxiety disorder: Secondary | ICD-10-CM

## 2020-12-23 NOTE — BH Specialist Note (Signed)
Integrated Behavioral Health Follow Up In-Person Visit  MRN: [id number] Name: Brianna Lee  Number of Integrated Behavioral Health Clinician visits: 5/6 Session Start time: 11:00am  Session End time: 12:00pm Total time: 60 minutes  Types of Service: Individual psychotherapy  Interpretor:No. Interpretor Name and Language: N/A  Subjective: Brianna Lee is a 30 y.o. female accompanied by  self Patient was referred by PCP Edwards for depression and anxiety. Patient reports the following symptoms/concerns: Reports feeling depressed, feeling tired, self-esteem disturbances, anxiousness, excessive worrying, restlessness, and irritability. Reports that she continues to feel stressed as a result of her job. Reports that her boyfriend recently lost his job and she has been providing financially. Reports that she continues to question her parenting but has felt supported by her boyfriend.   Duration of problem: 1+ year; Severity of problem: moderate  Objective: Mood: Anxious and Depressed and Affect: Appropriate Risk of harm to self or others: No plan to harm self or others  Life Context: Family and Social: Reports that she has a son and currently lives with her boyfriend. Reports that she was raised by her grandparents and her grandfather passed away in Aug 08, 2019. Reports that she has a strained relationship with her parents.  School/Work: Pt currently works full-time. Reports that her boyfriend recently lost his job and she has been providing financially.  Self-Care: Reports that she utilizes meditation and deep breathing. Reports creating a music playlist to decrease anxiousness. Reports that she journals.  Life Changes:Pt's grandfather also passed away over a year ago.  Pt's father has recently been more involved in her life since she gave birth. Reports that her relationship with her boyfriend is improving and they continue to communicate. Reports that she has been overwhelmed at work. Reports that her  boyfriend lost his job and they worked at the same location. Reports having worries that she will lose her job.   Patient and/or Family's Strengths/Protective Factors: Social connections, Social and Patent attorney, Concrete supports in place (healthy food, safe environments, etc.), and Caregiver has knowledge of parenting & child development  Goals Addressed: Patient will:  Reduce symptoms of: anxiety, depression, and stress   Increase knowledge and/or ability of: coping skills, self-management skills, and stress reduction   Demonstrate ability to: Increase healthy adjustment to current life circumstances and Begin healthy grieving over loss  Progress towards Goals: Ongoing  Interventions: Interventions utilized:  Mindfulness or Management consultant, CBT Cognitive Behavioral Therapy, Supportive Counseling, and Link to [employer] Standardized Assessments completed: GAD-7 and PHQ 9  Depression screen [facility] 2/9 12/22/2020 12/10/2020 12/02/2020 11/04/2020 10/21/2020  Decreased Interest 1 1 2 2 2   Down, Depressed, Hopeless 3 2 1 2 3   PHQ - 2 Score 4 3 3 4 5   Altered sleeping 1 0 2 1 2   Tired, decreased energy 2 1 2 1 2   Change in appetite 0 0 0 0 1  Feeling bad or failure about yourself  2 2 1 1 3   Trouble concentrating 0 0 1 1 0  Moving slowly or fidgety/restless 0 0 0 2 1  Suicidal thoughts 0 0 0 0 0  PHQ-9 Score 9 6 9 10 14   Difficult doing work/chores - Somewhat difficult - - -  Some recent data might be hidden    GAD 7 : Generalized Anxiety Score 12/22/2020 12/10/2020 12/01/2020 11/04/2020  Nervous, Anxious, on Edge 3 2 2 3   Control/stop worrying 2 2 1 3   Worry too much - different things 2 2 0 3  Trouble relaxing  0 0 0 1  Restless 1 0 1 2  Easily annoyed or irritable 3 3 2 3   Afraid - awful might happen 3 3 2 3   Total GAD 7 Score 14 12 8 18   Anxiety Difficulty - Somewhat difficult - -    Patient and/or Family Response: Pt receptive to tx. Pt receptive to psychoeducation  provided on stress. Pt receptive to cognitive restructuring in order to decrease pt worries and improve cognitiive processing skills. Pt receptive to supportive counseling as she primarily wanted to discuss her frustrations with her job and them firing her boyfriend. Pt will continue deep breathing exercises, meditation, listening to music, and journalling.   Patient Centered Plan: Patient is on the following Treatment Plan(s): Depression, anxiety, and stress  Assessment: Denies SI/HI. Denies auditory/visual hallucinations. Patient currently experiencing stress, anxiety, and depression. Pt's symptoms of anxiety and depression appear to have increased since previous visit as a result of her boyfriend losing employment. Pt continues to maintain employment and is hopeful that her boyfriend will find employment. Pt is worried about finances and appears to assume the worst. Pt's primary focus in this visit was her frustration with current employment due to them firing her boyfriend. Pt did not discuss her mother in this visit.    Patient may benefit from continuing healthy coping skills. LCSWA provided psychoeducation on stress. LCSWA utilized cognitive restructuring to decrease pt worries. LCSWA provided information on a resource called "FLEX" which assist people with paying rent over time. LCSWA encouraged pt to continue healthy coping skills. LCSWA will fu with pt.   Plan: Follow up with behavioral health clinician on : 01/12/21 Behavioral recommendations: Continue deep breathing exercises, meditation, journalling and listening to music.  Referral(s): Integrated (In Clinic) and Resources:  Finances  "From scale of 1-10, how likely are you to follow plan?": 10  Elric Tirado C Alizaya Oshea, LCSW

## 2021-01-03 ENCOUNTER — Encounter (INDEPENDENT_AMBULATORY_CARE_PROVIDER_SITE_OTHER): Payer: Self-pay | Admitting: Primary Care

## 2021-01-06 NOTE — Telephone Encounter (Signed)
Called patient regarding knot around c-section site. Appointment made for 01/07/2021 at 9:35 AM.  Clovis Pu, RN

## 2021-01-07 ENCOUNTER — Other Ambulatory Visit: Payer: Self-pay

## 2021-01-07 ENCOUNTER — Ambulatory Visit (INDEPENDENT_AMBULATORY_CARE_PROVIDER_SITE_OTHER): Payer: Medicaid Other

## 2021-01-07 VITALS — BP 120/84 | HR 74 | Temp 98.7°F | Ht 64.0 in | Wt 268.2 lb

## 2021-01-07 DIAGNOSIS — L739 Follicular disorder, unspecified: Secondary | ICD-10-CM

## 2021-01-07 NOTE — Progress Notes (Signed)
   GYNECOLOGY PROGRESS NOTE  History:  30 y.o. G2P0111 presents to [facility] office today for problem "knot on c-section scar". She reports on Monday she felt a "knot" where her c-section scar is. It was painful and draining pus and blood at the time. Today, it is not draining any, but wanted to have it checked out and make sure nothing was wrong with her c-section scar.  The following portions of the patient's history were reviewed and updated as appropriate: allergies, current medications, past family history, past medical history, past social history, past surgical history and problem list. Last pap smear on 09/26/2019 was normal, negative HRHPV.  Health Maintenance Due  Topic Date Due   COVID-19 Vaccine (3 - Booster for Moderna series) 05/08/2020   INFLUENZA VACCINE  Never done     Review of Systems:  Pertinent items are noted in HPI.   Objective:  Physical Exam Blood pressure 120/84, pulse 74, temperature 98.7 F (37.1 C), temperature source Oral, height 5\' 4"  (1.626 m), weight 268 lb 3.2 oz (121.7 kg), not currently breastfeeding. VS reviewed, nursing note reviewed,  Constitutional: well developed, well nourished, no distress HEENT: normocephalic CV: normal rate Pulm/chest wall: normal effort Breast Exam: deferred Abdomen: soft, non-tender, c-section scar well approximated without lesions/ulcerations, pencil eraser sized bump on right superior to scar, no bleeding/drainage, c/w ingrown hair/folliculitis.  Neuro: alert and oriented x 3 Skin: warm, dry Psych: affect normal Pelvic exam: deferred  Assessment & Plan:  1. Folliculitis - I suspect ingrown hair/folliculitis give location of bump. There is no drainage/bleeding. Patient afebrile.  - Patient instructed to keep area, clean and dry as well as instructed to not shave area. Avoid constrictive clothing. May use heat prn. If symptoms worsen, may consider antibiotics, but at this time I feel are not needed. - Patient to  contact office for follow up if symptoms do not improve or worsen    , CNM 01/07/21 10:05 AM

## 2021-01-12 ENCOUNTER — Encounter: Payer: Self-pay | Admitting: Clinical

## 2021-01-12 ENCOUNTER — Ambulatory Visit: Payer: Medicaid Other | Attending: Primary Care | Admitting: Clinical

## 2021-01-12 DIAGNOSIS — F411 Generalized anxiety disorder: Secondary | ICD-10-CM

## 2021-01-12 DIAGNOSIS — F331 Major depressive disorder, recurrent, moderate: Secondary | ICD-10-CM

## 2021-01-13 IMAGING — US US OB TRANSVAGINAL
1 series · 15 of 27 positions shown · non-contrast
Comparison: 06/12/2019

CLINICAL DATA: Vaginal bleeding in 1st trimester pregnancy.
Assigned GA currently 8 weeks 4 days by prior ultrasound.

EXAM:
TRANSVAGINAL OB ULTRASOUND
TECHNIQUE: Transvaginal ultrasound was performed for complete evaluation of the
gestation as well as the maternal uterus, adnexal regions, and
pelvic cul-de-sac.

[Series 1: us ob transvaginal · 27 acquisitions, 15 frames shown]
[im 1/27]
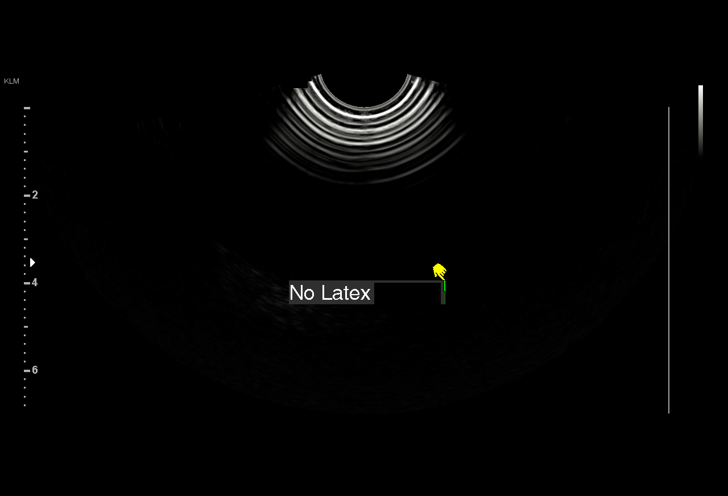
[im 3/27]
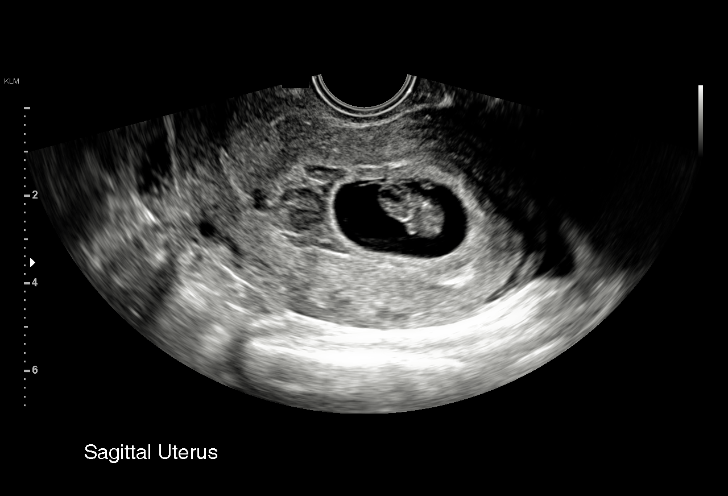
[im 5/27]
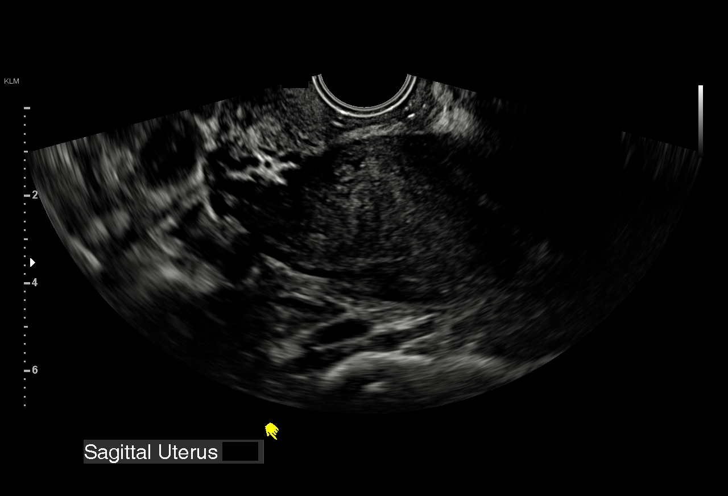
[im 7/27]
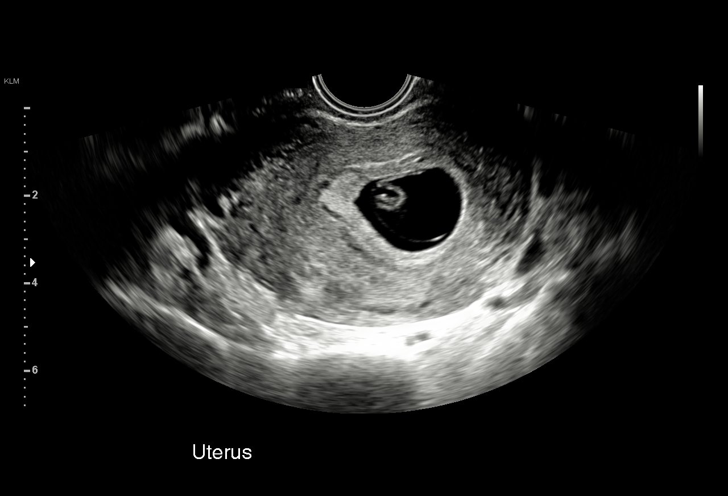
[im 9/27]
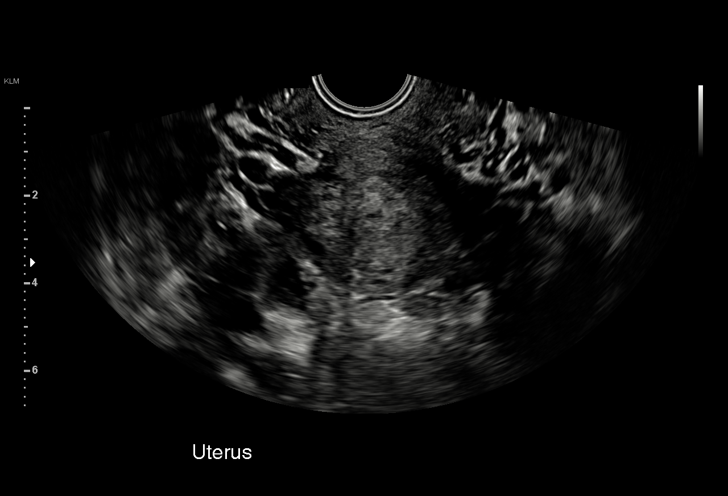
[im 10/27]
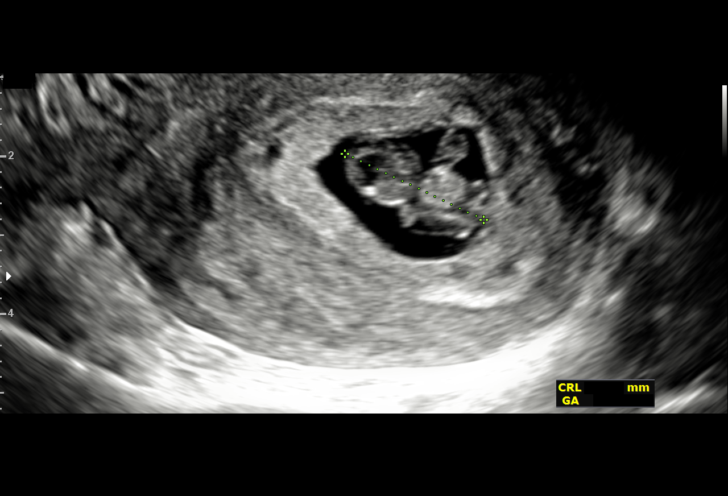
[im 12/27]
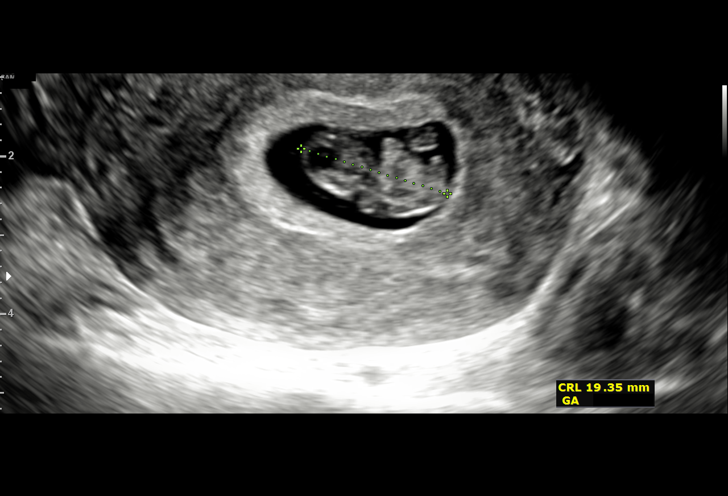
[im 14/27]
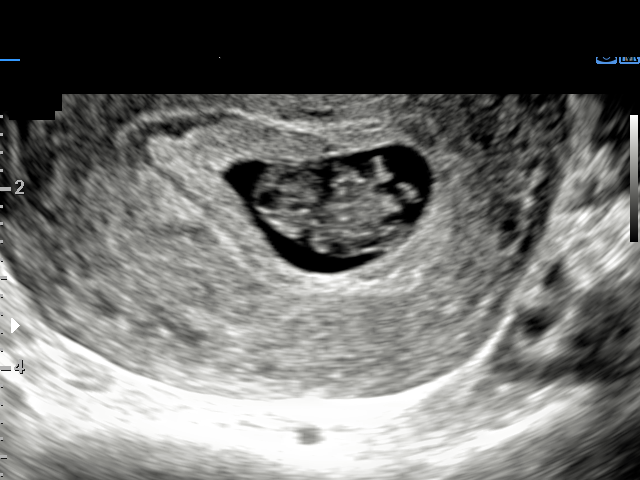
[im 16/27]
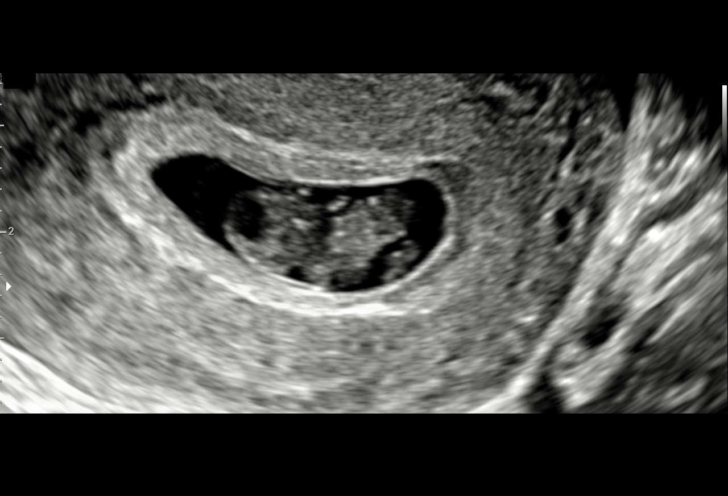
[im 18/27]
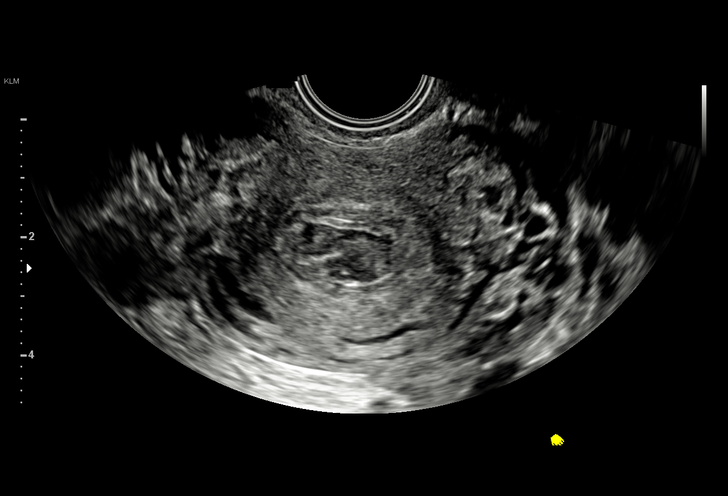
[im 19/27]
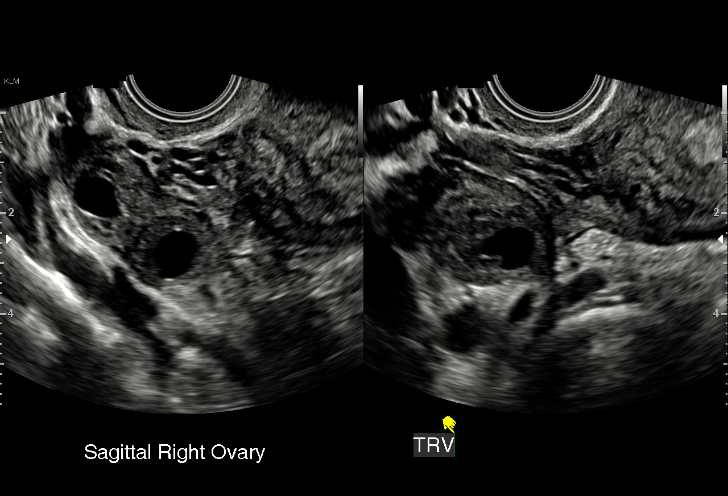
[im 21/27]
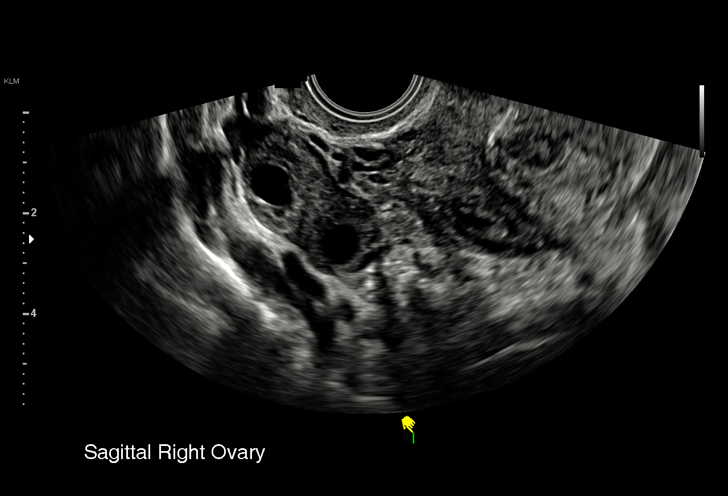
[im 23/27]
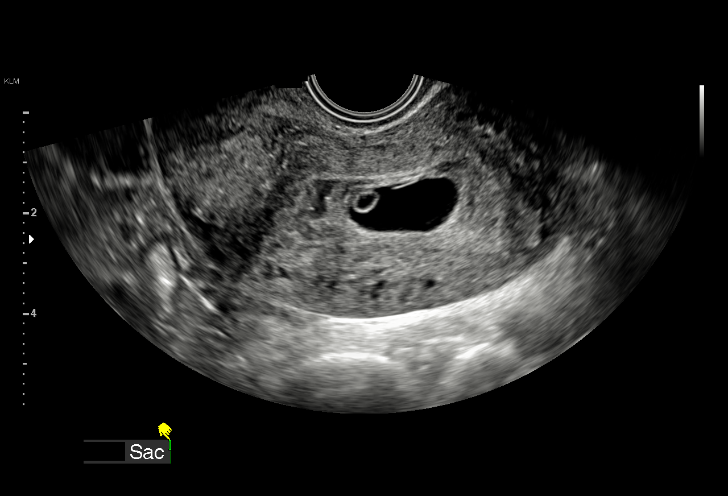
[im 25/27]
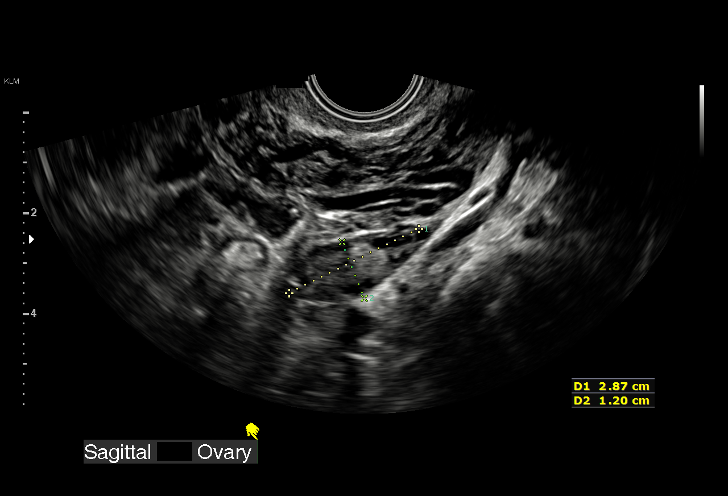
[im 27/27]
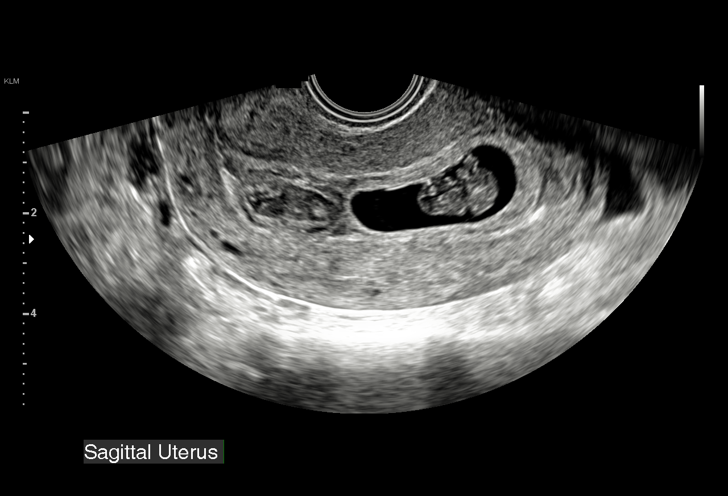

[15 of 27 positions shown; findings below may reference images not displayed]

FINDINGS: Intrauterine gestational sac: Single

Yolk sac:  Visualized.

Embryo:  Visualized.

Cardiac Activity: Visualized.

Heart Rate: 167 bpm

CRL:   20 mm   8 w 3 d                  US EDC: 01/25/2020

Subchorionic hemorrhage: Small subchorionic hemorrhage shows no
significant change compared to prior study.

Maternal uterus/adnexae: Retroflexed uterus again noted. Normal
appearance of both ovaries. No mass or abnormal free fluid
identified.
IMPRESSION: Single living IUP with assigned gestational age of 8 weeks 4 days by
prior ultrasound. Appropriate interval growth.

Small subchorionic hemorrhage, without significant change.

## 2021-01-17 NOTE — BH Specialist Note (Signed)
Integrated Behavioral Health Follow Up In-Person Visit  MRN: [id number] Name: LEEBA BARBE  Number of Integrated Behavioral Health Clinician visits: 6/6 Session Start time: 9:05am  Session End time: 10:00am Total time: 55  minutes  Types of Service: Individual psychotherapy  Interpretor:No. Interpretor Name and Language: N/A  Subjective: Brianna Lee is a 30 y.o. female accompanied by  self Patient was referred by PCP Gwinda Passe, NP for depression and anxiety. Patient reports the following symptoms/concerns: Reports feeling depressed, decreased energy, self-esteem disturbances, anxiousness, excessive worrying, and irritability. Reports continued stress related to her job and finances. Reports that her boyfriend has obtained a new job. Reports that her apartment is evicting her although she recently paid her past two months rent after receiving an eviction notice during the summer time. Pt reports continues to question her parenting.  Duration of problem: 1+ year; Severity of problem: moderate  Objective: Mood: Anxious and Depressed and Affect: Appropriate Risk of harm to self or others: No plan to harm self or others  Life Context: Family and Social: Reports that she has a son and currently lives with her boyfriend. Reports that she was raised by her grandparents and her grandfather passed away in 08-25-2019. Reports that she has a strained relationship with her parents.  School/Work: Pt currently works full-time. Pt's boyfriend has recently obtained a new job.  Self-Care: Reports that she utilizes meditation and deep breathing. Reports creating a music playlist to decrease anxiousness. Reports that she journals.  Life Changes: Reports that her boyfriend re  Patient and/or Family's Strengths/Protective Factors: Social connections, Social and Emotional competence, Concrete supports in place (healthy food, safe environments, etc.), and Caregiver has knowledge of parenting & child  development  Goals Addressed: Patient will:  Reduce symptoms of: anxiety, depression, and stress   Increase knowledge and/or ability of: coping skills, self-management skills, and stress reduction   Demonstrate ability to: Increase healthy adjustment to current life circumstances  Progress towards Goals: Revised and Ongoing  Interventions: Interventions utilized:  Mindfulness or Management consultant, CBT Cognitive Behavioral Therapy, Supportive Counseling, and Psychoeducation and/or Health Education Standardized Assessments completed: GAD-7 and PHQ 9  Patient and/or Family Response: Pt receptive to tx. Pt receptive to psychoeducation provided on anxiety. Pt receptive to cognitive restructuring utilized to improve pt's cognitive processing skills and to decrease worries. Pt receptive to supportive counseling due to ongoing stress with finances. Pt will continue healthy coping skills (meditation, listening to music, and journalling).   Patient Centered Plan: Patient is on the following Treatment Plan(s): Depression, Anxiety, and stress  Assessment: Denies SI/HI. Denies auditory/visual hallucinations. Patient currently experiencing stress, anxiety, and depression. Pt is experiencing financial issues and is being evicted on 02/14/21. Pt continues to question her parenting. Pt continues to experience difficulty with cognitive processing skills however they are improving as she continues healthy coping skills. Pt continues to experience excessive worrying. Pt has not discussed the grief related to her grandfather and she has not discussed her relationship with her mother due to her current stressors.    Patient may benefit from addition g therapy. LCSWA provided psychoeducation on anxiety. LCSWA utilized cognitive restructuring to improve pt's cognitive processing skills and decrease pt worries. LCSWA encouraged pt to continue healthy coping skills. LCSWA will complete legal aid referral to assist  with eviction notice. LCSWA will fu with pt.  Plan: Follow up with behavioral health clinician on : 01/31/21 Behavioral recommendations: Continue healthy coping skills (meditation, deep breathing exercises, journalling, and listening to music) Referral(s):  Community Resources:  Landscape architect "From scale of 1-10, how likely are you to follow plan?": 10  Keiandra Sullenger C Prosper Paff, LCSW

## 2021-01-31 ENCOUNTER — Encounter: Payer: Self-pay | Admitting: Primary Care

## 2021-01-31 ENCOUNTER — Ambulatory Visit (HOSPITAL_BASED_OUTPATIENT_CLINIC_OR_DEPARTMENT_OTHER): Payer: Medicaid Other | Admitting: Clinical

## 2021-01-31 ENCOUNTER — Ambulatory Visit: Payer: Medicaid Other | Attending: Primary Care | Admitting: Clinical

## 2021-01-31 ENCOUNTER — Other Ambulatory Visit: Payer: Self-pay

## 2021-01-31 DIAGNOSIS — F411 Generalized anxiety disorder: Secondary | ICD-10-CM
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
## 2021-02-11 NOTE — BH Specialist Note (Signed)
Opened in error

## 2021-02-14 ENCOUNTER — Ambulatory Visit: Payer: Medicaid Other | Attending: Family Medicine | Admitting: Clinical

## 2021-02-14 ENCOUNTER — Other Ambulatory Visit: Payer: Self-pay

## 2021-02-14 ENCOUNTER — Encounter: Payer: Self-pay | Admitting: Primary Care

## 2021-02-14 DIAGNOSIS — F411 Generalized anxiety disorder: Secondary | ICD-10-CM
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
## 2021-03-07 ENCOUNTER — Ambulatory Visit: Payer: Medicaid Other | Attending: Primary Care | Admitting: Clinical

## 2021-03-07 ENCOUNTER — Encounter: Payer: Self-pay | Admitting: Primary Care

## 2021-03-07 ENCOUNTER — Other Ambulatory Visit: Payer: Self-pay

## 2021-03-07 DIAGNOSIS — F411 Generalized anxiety disorder: Secondary | ICD-10-CM | POA: Diagnosis not present

## 2021-03-07 NOTE — BH Specialist Note (Signed)
Integrated Behavioral Health Follow Up In-Person Visit  MRN: [id number] Name: Brianna Lee  Number of Integrated Behavioral Health Clinician visits:  9 Session Start time: 11:20am  Session End time: 12:10pm Total time: 50  minutes  Types of Service: Individual psychotherapy  Interpretor:No. Interpretor Name and Language: N/A  Subjective: Brianna Lee is a 30 y.o. female accompanied by  self Patient was referred by Gwinda Passe, NP for depression and anxiety. Patient reports the following symptoms/concerns: Reports feeling anxious, irritable, overwhelmed, and excessive worrying. Reports that she has experienced stress related to work, finances, and recent problems with her car. Reports that she has noticed herself becoming irritated easily. Reports that has been trying to vent to family but has not felt supported. Duration of problem: 1+ year; Severity of problem: moderate  Objective: Mood: Anxious and Depressed and Affect: Appropriate and Tearful Risk of harm to self or others: No plan to harm self or others  Life Context: Family and Social: Reports that she has a son and currently lives with her boyfriend. Reports that she was raised by her grandparents and her grandfather passed away in 2019-07-27. Reports that she and her boyfriend moved into a new apartment.  School/Work: Pt currently works full-time. Pt's boyfriend has recently obtained a new job.  Self-Care: Reports that she utilizes meditation and deep breathing. Reports creating a music playlist to decrease anxiousness. Reports that she journals.  Life Changes: Pt is overwhelmed with work and finances. Pt's car recently broke down and she has been stressed with trying to get it fixed. Reports that she has had difficulty communicating her frustrations with family/   Patient and/or Family's Strengths/Protective Factors: Social connections, Social and Emotional competence, Concrete supports in place (healthy food, safe  environments, etc.), and Caregiver has knowledge of parenting & child development  Goals Addressed: Patient will:  Reduce symptoms of: anxiety   Increase knowledge and/or ability of: coping skills   Demonstrate ability to: Increase healthy adjustment to current life circumstances and utilize coping skills taught in therapy to reduce symptoms, and identify triggers for trauma hx so that pt knows how to respond  Progress towards Goals: Ongoing  Interventions: Interventions utilized:  Mindfulness or Management consultant, CBT Cognitive Behavioral Therapy, Supportive Counseling, Communication Skills, and problem solving skills Standardized Assessments completed: Not Needed  Patient and/or Family Response: Pt receptive to tx. Pt receptive to assistance with communication skills and limiting who she opens up to. Pt receptive to psychoeducation provided on anxiety and the normalization of irritability. Pt was receptive to assistance with problem solving skills with current employment. Pt will utilize meditation and deep breathing exercises.   Patient Centered Plan: Patient is on the following Treatment Plan(s): Anxiety  Assessment: Denies SI/HI. Denies auditory/visual hallucinations. No safety risks. Patient currently experiencing anxiety and irritability related to current stress. Pt appears overwhelmed with current job and finances. Pt has been unable to communicate her frustrations effectively with family. Pt appears to also be experiencing a grief reaction as she is missing her grandfather due to him being a support system. Pt also frequently assumes the worst and his difficulty focusing on the present moment.   Patient may benefit from assistance with problem solving and continued cognitive restructuring. LCSWA provided psychoeducation on anxiety. LCSWA provided assistance with communication and encouraged pt to limit who she vents to due to them not properly supporting her. LCSWA utilized  cognitive restructuring to decrease negative and unhelpful thoughts. LCSWA encouraged pt to utilize meditation and deep breathing exercises.  LCSWA will fu with pt.  Plan: Follow up with behavioral health clinician on : 03/23/21 Behavioral recommendations: Utilize meditation and deep breathing.  Referral(s): Integrated [employer] (In Clinic) "From scale of 1-10, how likely are you to follow plan?": 10  Diamonte Stavely C Sylvana Bonk, LCSW

## 2021-03-15 ENCOUNTER — Ambulatory Visit (INDEPENDENT_AMBULATORY_CARE_PROVIDER_SITE_OTHER): Payer: Medicaid Other | Admitting: Primary Care

## 2021-03-15 ENCOUNTER — Encounter (INDEPENDENT_AMBULATORY_CARE_PROVIDER_SITE_OTHER): Payer: Self-pay | Admitting: Primary Care

## 2021-03-15 ENCOUNTER — Other Ambulatory Visit (INDEPENDENT_AMBULATORY_CARE_PROVIDER_SITE_OTHER): Payer: Self-pay | Admitting: Primary Care

## 2021-03-15 ENCOUNTER — Other Ambulatory Visit: Payer: Self-pay

## 2021-03-15 VITALS — BP 131/85 | HR 80 | Temp 97.3°F | Ht 64.0 in | Wt 260.4 lb

## 2021-03-15 DIAGNOSIS — Z760 Encounter for issue of repeat prescription: Secondary | ICD-10-CM

## 2021-03-15 DIAGNOSIS — I10 Essential (primary) hypertension: Secondary | ICD-10-CM

## 2021-03-15 DIAGNOSIS — D509 Iron deficiency anemia, unspecified: Secondary | ICD-10-CM | POA: Diagnosis not present

## 2021-03-15 MED ORDER — NIFEDIPINE ER 30 MG PO TB24: 30.0000 mg | ORAL_TABLET | Freq: Every day | ORAL | 1 refills | Status: DC

## 2021-03-15 NOTE — Progress Notes (Signed)
[facility]   [name] is a 30 y.o. female presents for hypertension evaluation, Denies shortness of breath, headaches, chest pain or lower extremity edema, sudden onset, vision changes, unilateral weakness, dizziness, paresthesias. Patient has voiced concerns about weight will be address next year.  Patient reports adherence with medications.  Dietary habits include: monitoring sodium  Exercise habits include:yes  Family / Social history: Father diabetic    Past Medical History:  Diagnosis Date   BMI 45.0-49.9, adult (HCC) 05/13/2020   COVID-19 affecting pregnancy in second trimester 04/29/2020   Hypertension    Scoliosis    Past Surgical History:  Procedure Laterality Date   CESAREAN SECTION N/A 06/17/2020   Procedure: CESAREAN SECTION;  Surgeon: Tereso Newcomer, MD;  Location: [facility] LD ORS;  Service: Obstetrics;  Laterality: N/A;   GALLBLADDER SURGERY     Allergies  Allergen Reactions   Naproxen Swelling   Current Outpatient Medications on File Prior to Visit  Medication Sig Dispense Refill   Blood Pressure Monitoring (BLOOD PRESSURE MONITOR AUTOMAT) DEVI 1 Device by Does not apply route daily. Automatic blood pressure cuff regular size. To monitor blood pressure regularly at home. ICD-10 code:Z34.90 1 each 0   ferrous sulfate 325 (65 FE) MG tablet Take 1 tablet (325 mg total) by mouth every other day. 21 tablet 0   No current facility-administered medications on file prior to visit.   Social History   Socioeconomic History   Marital status: Single    Spouse name: Not on file   Number of children: 1   Years of education: Not on file   Highest education level: High school graduate  Occupational History   Not on file  Tobacco Use   Smoking status: Former    Types: Cigarettes   Smokeless tobacco: Never   Tobacco comments:    2 cigarettes a day   Vaping Use   Vaping Use: Never used  Substance and Sexual Activity   Alcohol use: Not  Currently   Drug use: Never   Sexual activity: Yes    Birth control/protection: None  Other Topics Concern   Not on file  Social History Narrative   Not on file   Social Determinants of Health   Financial Resource Strain: Not on file  Food Insecurity: Not on file  Transportation Needs: Not on file  Physical Activity: Not on file  Stress: Not on file  Social Connections: Not on file  Intimate Partner Violence: Not on file   Family History  Problem Relation Age of Onset   Hypertension Father    Diabetes Father      OBJECTIVE:  Vitals:   03/15/21 1042  BP: 131/85  Pulse: (!) 8  Temp: (!) 97.3 F (36.3 C)  TempSrc: Temporal  SpO2: 97%  Weight: 260 lb 6.4 oz (118.1 kg)  Height: 5\' 4"  (1.626 m)    Physical exam: General: Vital signs reviewed.  Patient is well-developed and well-nourished,morbid obese female in no acute distress and cooperative with exam. Head: Normocephalic and atraumatic. Eyes: EOMI, conjunctivae normal, no scleral icterus. Neck: Supple, trachea midline, normal ROM, no JVD, masses, thyromegaly, or carotid bruit present. Cardiovascular: RRR, S1 normal, S2 normal, no murmurs, gallops, or rubs. Pulmonary/Chest: Clear to auscultation bilaterally, no wheezes, rales, or rhonchi. Abdominal: Soft, non-tender, non-distended, BS +, no masses, organomegaly, or guarding present. Musculoskeletal: No joint deformities, erythema, or stiffness, ROM full and nontender. Extremities: No lower extremity edema bilaterally,  pulses symmetric and intact bilaterally. No  cyanosis or clubbing. Neurological: A&O x3, Strength is normal Skin: Warm, dry and intact. No rashes or erythema. Psychiatric: Normal mood and affect. speech and behavior is normal. Cognition and memory are normal.     ROS Comprehensive ROS noted in HPI Last 3 Office BP readings: BP Readings from Last 3 Encounters:  03/15/21 131/85  01/07/21 120/84  12/10/20 112/82    BMET    Component Value  Date/Time   NA 139 10/12/2020 1448   K 4.3 10/12/2020 1448   CL 102 10/12/2020 1448   CO2 23 10/12/2020 1448   GLUCOSE 78 10/12/2020 1448   GLUCOSE 124 (H) 06/18/2020 0517   BUN 9 10/12/2020 1448   CREATININE 0.87 10/12/2020 1448   CALCIUM 9.4 10/12/2020 1448   GFRNONAA >60 06/18/2020 0517   GFRAA 146 05/19/2020 1443    Renal function: CrCl cannot be calculated (Patient's most recent lab result is older than the maximum 21 days allowed.).  Clinical ASCVD: No  The ASCVD Risk score (Arnett DK, et al., 2019) failed to calculate for the following reasons:   The 2019 ASCVD risk score is only valid for ages 18 to 44  ASCVD risk factors include- [place]   ASSESSMENT & PLAN:  No diagnosis found.  Meds ordered this encounter  Medications   NIFEdipine (ADALAT CC) 30 MG 24 hr tablet    Sig: Take 1 tablet (30 mg total) by mouth daily.    Dispense:  90 tablet    Refill:  1   Avy was seen today for hypertension and weight loss.  Diagnoses and all orders for this visit:  Essential hypertension  -Counseled on lifestyle modifications for blood pressure control including reduced dietary sodium, increased exercise, weight reduction and adequate sleep. Also, educated patient about the risk for cardiovascular events, stroke and heart attack. Also counseled patient about the importance of medication adherence. If you participate in smoking, it is important to stop using tobacco as this will increase the risks associated with uncontrolled blood pressure.   -Hypertension longstanding diagnosed currently Nifedipine 30mg  24 daily  on current medications. Patient is adherent with current medications.   Goal BP:  For patients younger than 60: Goal BP < 130/80. For patients 60 and older: Goal BP < 140/90. For patients with diabetes: Goal BP < 130/80. Your most recent BP: 131/85  Minimize salt intake. Minimize alcohol intake  Other orders/refilled medication -     NIFEdipine (ADALAT CC) 30 MG  24 hr tablet; Take 1 tablet (30 mg total) by mouth daily.    This note has been created with . Any transcriptional errors are unintentional.   Education officer, environmental, NP 03/15/2021, 10:57 AM

## 2021-03-16 LAB — CBC WITH DIFFERENTIAL/PLATELET
Basophils Absolute: 0 10*3/uL (ref 0.0–0.2)
Basos: 1 %
EOS (ABSOLUTE): 0 10*3/uL (ref 0.0–0.4)
Eos: 0 %
Hematocrit: 45.1 % (ref 34.0–46.6)
Hemoglobin: 14.2 g/dL (ref 11.1–15.9)
Immature Grans (Abs): 0 10*3/uL (ref 0.0–0.1)
Immature Granulocytes: 0 %
Lymphocytes Absolute: 1.8 10*3/uL (ref 0.7–3.1)
Lymphs: 35 %
MCH: 25.4 pg — ABNORMAL LOW (ref 26.6–33.0)
MCHC: 31.5 g/dL (ref 31.5–35.7)
MCV: 81 fL (ref 79–97)
Monocytes Absolute: 0.6 10*3/uL (ref 0.1–0.9)
Monocytes: 11 %
Neutrophils Absolute: 2.8 10*3/uL (ref 1.4–7.0)
Neutrophils: 53 %
Platelets: 327 10*3/uL (ref 150–450)
RBC: 5.58 x10E6/uL — ABNORMAL HIGH (ref 3.77–5.28)
RDW: 14.7 % (ref 11.7–15.4)
WBC: 5.2 10*3/uL (ref 3.4–10.8)

## 2021-03-16 LAB — CMP14+EGFR
ALT: 19 IU/L (ref 0–32)
AST: 17 IU/L (ref 0–40)
Albumin/Globulin Ratio: 1.2 (ref 1.2–2.2)
Albumin: 3.7 g/dL — ABNORMAL LOW (ref 3.9–5.0)
Alkaline Phosphatase: 110 IU/L (ref 44–121)
BUN/Creatinine Ratio: 9 (ref 9–23)
BUN: 8 mg/dL (ref 6–20)
Bilirubin Total: 0.2 mg/dL (ref 0.0–1.2)
CO2: 24 mmol/L (ref 20–29)
Calcium: 9 mg/dL (ref 8.7–10.2)
Chloride: 104 mmol/L (ref 96–106)
Creatinine, Ser: 0.92 mg/dL (ref 0.57–1.00)
Globulin, Total: 3.1 g/dL (ref 1.5–4.5)
Glucose: 75 mg/dL (ref 70–99)
Potassium: 4.2 mmol/L (ref 3.5–5.2)
Sodium: 139 mmol/L (ref 134–144)
Total Protein: 6.8 g/dL (ref 6.0–8.5)
eGFR: 86 mL/min/{1.73_m2} (ref >59)

## 2021-03-16 LAB — LIPID PANEL
Chol/HDL Ratio: 3.8 ratio (ref 0.0–4.4)
Cholesterol, Total: 150 mg/dL (ref 100–199)
HDL: 39 mg/dL — ABNORMAL LOW (ref >39)
LDL Chol Calc (NIH): 96 mg/dL (ref 0–99)
Triglycerides: 79 mg/dL (ref 0–149)
VLDL Cholesterol Cal: 15 mg/dL (ref 5–40)

## 2021-03-23 ENCOUNTER — Encounter: Payer: Self-pay | Admitting: Primary Care

## 2021-03-23 ENCOUNTER — Other Ambulatory Visit: Payer: Self-pay

## 2021-03-23 ENCOUNTER — Ambulatory Visit: Payer: Medicaid Other | Attending: Primary Care | Admitting: Clinical

## 2021-03-23 DIAGNOSIS — F411 Generalized anxiety disorder: Secondary | ICD-10-CM | POA: Diagnosis not present

## 2021-03-28 NOTE — BH Specialist Note (Signed)
Integrated Behavioral Health Follow Up In-Person Visit  MRN: [id number] Name: Brianna Lee  Number of Integrated Behavioral Health Clinician visits:  10 Session Start time: 10:00am  Session End time: 10:35am Total time: 35  minutes  Types of Service: Individual psychotherapy  Interpretor:No. Interpretor Name and Language: N/A  Subjective: Brianna Lee is a 30 y.o. female accompanied by  self Patient was referred by Gwinda Passe, NP for depression and anxiety. Patient reports the following symptoms/concerns: Reports feeling anxious , worrying and irritable. Reports feeling overwhelmed with finances and current job. Reports that she is frustrated with not making enough money at her current job and being overworked. Reports that she does not like having to ask family for assistance. Reports wanting to go back to school to provide a "better life" for her son. Duration of problem: 1+ year; Severity of problem: moderate  Objective: Mood: Anxious and Affect: Appropriate Risk of harm to self or others: No plan to harm self or others  Life Context: Family and Social: Reports that she has a son and currently lives with her boyfriend. Reports that she was raised by her grandparents and her grandfather passed away in 08-05-2019. Reports that she and her boyfriend moved into a new apartment.  School/Work: Pt currently works full-time. Pt's boyfriend has recently obtained a new job.  Self-Care: Reports that she utilizes meditation and deep breathing. Reports creating a music playlist to decrease anxiousness. Reports that she journals.  Life Changes: Pt is overwhelmed with work and finances.. Reports that she has had difficulty communicating her frustrations with family. Pt is stressed with current job.  (No significant changes to life context)  Patient and/or Family's Strengths/Protective Factors: Social connections, Social and Emotional competence, Concrete supports in place (healthy food,  safe environments, etc.), Sense of purpose, and Caregiver has knowledge of parenting & child development  Goals Addressed: Patient will:  Reduce symptoms of: anxiety   Increase knowledge and/or ability of: coping skills   Demonstrate ability to: Increase healthy adjustment to current life circumstances and  utilize coping skills taught in therapy to reduce symptoms, and identify triggers for trauma hx so that pt knows how to respond  Progress towards Goals: Ongoing  Interventions: Interventions utilized:  Mindfulness or Management consultant, CBT Cognitive Behavioral Therapy, Supportive Counseling, and problem solving skills Standardized Assessments completed: Not Needed  Patient and/or Family Response: Pt receptive to tx. Pt receptive to psychoeducation provided on anxiety. Pt receptive to identification of schema and cognitive restructuring to assist with negative pattern. Pt receptive to problem solving skills to assist with concerns with job and wanting to go to school. Pt will continue meditation, journalling, deep breathing, and listening to music.  Patient Centered Plan: Patient is on the following Treatment Plan(s): Anxiety  Assessment: Denies SI/HI. Denies auditory/visual hallucinations. Patient currently experiencing anxiety related to current stress. Pt appears to have difficulty with current job and wanting to be in an enjoyable career. Pt continues to have difficulty with assuming the worst and focusing on the present moment.   Patient may benefit from problem solving skills to assist with career concerns and continued CBT. LCSWA provided psychoeducation on anxiety. LCSWA assisted pt in identifying schemas and cognitive restructuring to identify negative patterns. LCSWA encouraged pt to utilize deep breathing, journalling, listening to music, and meditation. LCSWA will fu with pt.  Plan: Follow up with behavioral health clinician on : 04/06/21 Behavioral recommendations: Utilize  deep breathing, journalling, meditation, and listening to music. Referral(s): Integrated [facility] (  In Clinic) "From scale of 1-10, how likely are you to follow plan?": 10  Oma Alpert C Vannah Nadal, LCSW

## 2021-04-06 ENCOUNTER — Other Ambulatory Visit: Payer: Self-pay

## 2021-04-06 ENCOUNTER — Ambulatory Visit: Payer: Medicaid Other | Attending: Primary Care | Admitting: Clinical

## 2021-04-06 DIAGNOSIS — F411 Generalized anxiety disorder: Secondary | ICD-10-CM

## 2021-04-19 NOTE — BH Specialist Note (Signed)
Integrated Behavioral Health Follow Up In-Person Visit  MRN: [id number] Name: Brianna Lee  Number of Integrated Behavioral Health Clinician visits:  11 Session Start time: 9:00am  Session End time: 9:30am Total time: 30 minutes  Types of Service: Individual psychotherapy  Interpretor:No. Interpretor Name and Language: N/A  Subjective: Brianna Lee is a 31 y.o. female accompanied by  self Patient was referred by Gwinda Passe, NP for depression and anxiety. Patient reports the following symptoms/concerns: Reports feeling anxious, worrying, and irritability. Reports continued stress with finances and her job. 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 Duration of problem: 1+ year; Severity of problem: moderate  Objective: Mood: Anxious and Affect: Appropriate Risk of harm to self or others: No plan to harm self or others  Life Context: Family and Social: Reports that she has a son and currently lives with her boyfriend. Reports that she was raised by her grandparents and her grandfather passed away in 08/19/2019. Reports that she and her boyfriend moved into a new apartment.  School/Work: Pt currently works full-time. Pt's boyfriend has recently obtained a new job.  Self-Care: Reports that she utilizes meditation and deep breathing. Reports creating a music playlist to decrease anxiousness. Reports that she journals.  Life Changes: Pt is overwhelmed with work and finances.. Reports that she has had difficulty communicating her frustrations with family. Pt is stressed with current job.  (No significant changes to life context)  Patient and/or Family's Strengths/Protective Factors: Social connections, Social and Emotional competence, Concrete supports in place (healthy food, safe environments, etc.), Sense of purpose, and Caregiver has knowledge of parenting & child development  Goals Addressed: Patient will:  Reduce symptoms of: anxiety   Increase knowledge  and/or ability of: coping skills   Demonstrate ability to: Increase healthy adjustment to current life circumstances and utilize coping skills taught in therapy to reduce symptoms, and identify triggers for trauma hx so that pt knows how to respond  Progress towards Goals: Ongoing  Interventions: Interventions utilized:  Mindfulness or Management consultant, CBT Cognitive Behavioral Therapy, and Supportive Counseling Standardized Assessments completed: Not Needed  Patient and/or Family Response: Pt receptive to tx. Pt receptive to continued support provided as pt disclosed her stressors. Pt receptive to cognitive restructuring to decrease negative thoughts. Pt receptive to continuing deep breathing, meditating, listening to music, and journalling.   Patient Centered Plan: Patient is on the following Treatment Plan(s): Anxiety  Assessment: Denies SI/HI. Denies auditory/visual hallucinations. Patient currently experiencing anxiety related to current stressors. Pt appears to be improving current stressors with trying to obtain a new job.   Patient may benefit from continued CBT to make progress towards goals. LCSWA utilized Chartered certified accountant. LCSWA provided support as pt disclosed her current stressors. LCSWA encouraged pt to continue healthy coping skills. LCSWA will fu with pt.  Plan: Follow up with behavioral health clinician on : 04/27/21 Behavioral recommendations: Continue healthy coping skills Referral(s): Integrated [facility] (In Clinic) "From scale of 1-10, how likely are you to follow plan?": 10  Norrine Ballester C Georgi Tuel, LCSW

## 2021-04-27 ENCOUNTER — Other Ambulatory Visit: Payer: Self-pay

## 2021-04-27 ENCOUNTER — Ambulatory Visit: Payer: Medicaid Other | Attending: Primary Care | Admitting: Clinical

## 2021-04-27 DIAGNOSIS — F411 Generalized anxiety disorder: Secondary | ICD-10-CM

## 2021-04-29 NOTE — BH Specialist Note (Signed)
Integrated Behavioral Health via Telemedicine Visit  04/27/2021 Joud COPPER WITKIN [id number]  Number of [place] visits: 12 Session Start time: 10:50am  Session End time: 11:05am Total time: 15  Referring Provider: Juluis Mire, NP Patient/Family location: Work [facility] Provider location: [facility] Office All persons participating in visit: Pt and LCSWA Types of Service: Individual psychotherapy and Video visit Pt's video disconnected after 8 minutes and connected to phone for 5 minutes. Pt did not have good signal.   I connected with [place] and/or Jean Rosenthal Diggins's via  Telephone and Video Enabled Telemedicine Application  (Video is Caregility application) and verified that I am speaking with the correct person using two identifiers. Discussed confidentiality: Yes   I discussed the limitations of telemedicine and the availability of in person appointments.  Discussed there is a possibility of technology failure and discussed alternative modes of communication if that failure occurs.  I discussed that engaging in this telemedicine visit, they consent to the provision of behavioral healthcare and the services will be billed under their insurance.  Patient and/or legal guardian expressed understanding and consented to Telemedicine visit: Yes   Presenting Concerns: Patient and/or family reports the following symptoms/concerns: Reports experiences problems at work. Reports that she may potentially have another job.  Duration of problem: 1+ year; Severity of problem: moderate  Patient and/or Family's Strengths/Protective Factors: Social connections, Social and Emotional competence, Concrete supports in place (healthy food, safe environments, etc.), Sense of purpose, and Caregiver has knowledge of parenting & child development  Goals Addressed: Patient will:  Reduce symptoms of: anxiety   Increase knowledge and/or ability of: coping skills   Demonstrate ability  to: Increase healthy adjustment to current life circumstances and utilize coping skills taught in therapy to reduce symptoms, and identify triggers for trauma hx so that pt knows how to respond  Progress towards Goals: Ongoing  Interventions: Interventions utilized:  Supportive Counseling Standardized Assessments completed: Not Needed  Patient and/or Family Response: Pt receptive to support provided as pt disclosed her frustrations with her job. Pt receptive to scheduling fu appt due to difficulty hearing pt.  Assessment: Patient currently experiencing stress at her current job. Pt is planning to leave her job. Latanya Presser had difficulty hearing pt due to connection. LCSWA scheduled fu appt with pt..  Plan: Follow up with behavioral health clinician on : 05/18/21 Behavioral recommendations: FU with LCSWA Referral(s): [place] (In Clinic)  I discussed the assessment and treatment plan with the patient and/or parent/guardian. They were provided an opportunity to ask questions and all were answered. They agreed with the plan and demonstrated an understanding of the instructions.   They were advised to call back or seek an in-person evaluation if the symptoms worsen or if the condition fails to improve as anticipated.  Altie Savard C Emmogene Simson, LCSW

## 2021-05-10 ENCOUNTER — Encounter: Payer: Self-pay | Admitting: Obstetrics and Gynecology

## 2021-05-16 ENCOUNTER — Ambulatory Visit (INDEPENDENT_AMBULATORY_CARE_PROVIDER_SITE_OTHER): Payer: Medicaid Other | Admitting: Primary Care

## 2021-05-16 ENCOUNTER — Other Ambulatory Visit: Payer: Self-pay

## 2021-05-16 ENCOUNTER — Encounter (INDEPENDENT_AMBULATORY_CARE_PROVIDER_SITE_OTHER): Payer: Self-pay | Admitting: Primary Care

## 2021-05-16 VITALS — BP 136/88 | HR 80 | Temp 97.9°F | Ht 64.0 in | Wt 259.8 lb

## 2021-05-16 DIAGNOSIS — Z6841 Body Mass Index (BMI) 40.0 and over, adult: Secondary | ICD-10-CM

## 2021-05-16 DIAGNOSIS — Z23 Encounter for immunization: Secondary | ICD-10-CM

## 2021-05-16 DIAGNOSIS — E559 Vitamin D deficiency, unspecified: Secondary | ICD-10-CM | POA: Diagnosis not present

## 2021-05-16 DIAGNOSIS — I10 Essential (primary) hypertension: Secondary | ICD-10-CM

## 2021-05-16 MED ORDER — NIFEDIPINE ER 30 MG PO TB24: 30.0000 mg | ORAL_TABLET | Freq: Every day | ORAL | 1 refills | Status: DC

## 2021-05-16 NOTE — Progress Notes (Signed)
[facility]  Brianna Lee, is a 31 y.o. female  MOQ:[id number]  PTW:[id number]  DOB - 30-Apr-1990  Chief Complaint  Patient presents with   weight management    Blood Pressure Check       Subjective:   Brianna Lee is a 31 y.o. female here today for a follow up visit. Patient has No headache, No chest pain, No abdominal pain - No Nausea, No new weakness tingling or numbness, No Cough - SOB.  No problems updated.  ALLERGIES: Allergies  Allergen Reactions   Naproxen Swelling    PAST MEDICAL HISTORY: Past Medical History:  Diagnosis Date   BMI 45.0-49.9, adult (HCC) 05/13/2020   COVID-19 affecting pregnancy in second trimester 04/29/2020   Hypertension    Scoliosis     MEDICATIONS AT HOME: Prior to Admission medications   Medication Sig Start Date End Date Taking? Authorizing Provider  Blood Pressure Monitoring (BLOOD PRESSURE MONITOR AUTOMAT) DEVI 1 Device by Does not apply route daily. Automatic blood pressure cuff regular size. To monitor blood pressure regularly at home. ICD-10 code:Z34.90 12/09/19  Yes [name], NP  ferrous sulfate 325 (65 FE) MG tablet Take 1 tablet (325 mg total) by mouth every other day. 06/20/20 08/01/20  Fife Bing, MD  NIFEdipine (ADALAT CC) 30 MG 24 hr tablet Take 1 tablet (30 mg total) by mouth daily. 05/16/21   Grayce Sessions, NP    Objective:   Vitals:   05/16/21 1043  BP: 136/88  Pulse: 80  Temp: 97.9 F (36.6 C)  TempSrc: Oral  SpO2: 100%  Weight: 259 lb 12.8 oz (117.8 kg)  Height: 5\' 4"  (1.626 m)   Exam General appearance : Awake, alert, not in any distress. Speech Clear. Not toxic looking HEENT: Atraumatic and Normocephalic, pupils equally reactive to light and accomodation Neck: Supple, no JVD. No cervical lymphadenopathy.  Chest: Good air entry bilaterally, no added sounds  CVS: S1 S2 regular, no murmurs.  Abdomen: Bowel sounds present, Non tender and not distended with no gaurding, rigidity or  rebound. Extremities: B/L Lower Ext shows no edema, both legs are warm to touch Neurology: Awake alert, and oriented X 3, CN II-XII intact, Non focal Skin: No Rash  Data Review Lab Results  Component Value Date   HGBA1C <4.2 (L) 12/09/2019    Assessment & Plan   1. Need for Tdap vaccination - Tdap vaccine greater than or equal to 7yo IM  2. Morbid obesity (HCC) Morbid Obesity is > 40 BMI  indicating an excess in caloric intake or underlining conditions. This may lead to other co-morbidities. Lifestyle modifications of diet and exercise may reduce obesity.    discussed healthier food choices.   3. Essential hypertension Counseled on blood pressure goal of less than 130/80, low-sodium, DASH diet, medication compliance, 150 minutes of moderate intensity exercise per week. Discussed medication compliance, adverse effects.  - NIFEdipine (ADALAT CC) 30 MG 24 hr tablet; Take 1 tablet (30 mg total) by mouth daily.  Dispense: 90 tablet; Refill: 1  4. Vitamin D deficiency Vitamin D is needed to make and keep bones strong.  - Vitamin D, 25-hydroxy    Patient have been counseled extensively about nutrition and exercise. Other issues discussed during this visit include: low cholesterol diet, weight control and daily exercise, foot care, annual eye examinations at Ophthalmology, importance of adherence with medications and regular follow-up. We also discussed long term complications of uncontrolled diabetes and hypertension.   Return in about 3 months (around  08/14/2021) for weight loss management .  The patient was given clear instructions to go to ER or return to medical center if symptoms don't improve, worsen or new problems develop. The patient verbalized understanding. The patient was told to call to get lab results if they haven't heard anything in the next week.   This note has been created with Education officer, environmental. Any transcriptional errors  are unintentional.   Grayce Sessions, NP 05/16/2021, 5:05 PM

## 2021-05-16 NOTE — Patient Instructions (Addendum)
Tdap (Tetanus, Diphtheria, Pertussis) Vaccine: What You Need to Know 1. Why get vaccinated? Tdap vaccine can prevent tetanus, diphtheria, and pertussis. Diphtheria and pertussis spread from person to person. Tetanus enters the body through cuts or wounds. TETANUS (T) causes painful stiffening of the muscles. Tetanus can lead to serious health problems, including being unable to open the mouth, having trouble swallowing and breathing, or death. DIPHTHERIA (D) can lead to difficulty breathing, heart failure, paralysis, or death. PERTUSSIS (aP), also known as "whooping cough," can cause uncontrollable, violent coughing that makes it hard to breathe, eat, or drink. Pertussis can be extremely serious especially in babies and young children, causing pneumonia, convulsions, brain damage, or death. In teens and adults, it can cause weight loss, loss of bladder control, passing out, and rib fractures from severe coughing. 2. Tdap vaccine Tdap is only for children 7 years and older, adolescents, and adults.  Adolescents should receive a single dose of Tdap, preferably at age 66 or 34 years. Pregnant people should get a dose of Tdap during every pregnancy, preferably during the early part of the third trimester, to help protect the newborn from pertussis. Infants are most at risk for severe, life-threatening complications from pertussis. Adults who have never received Tdap should get a dose of Tdap. Also, adults should receive a booster dose of either Tdap or Td (a different vaccine that protects against tetanus and diphtheria but not pertussis) every 10 years, or after 5 years in the case of a severe or dirty wound or burn. Tdap may be given at the same time as other vaccines. 3. Talk with your health care provider Tell your vaccine provider if the person getting the vaccine: Has had an allergic reaction after a previous dose of any vaccine that protects against tetanus, diphtheria, or pertussis, or has any  severe, life-threatening allergies Has had a coma, decreased level of consciousness, or prolonged seizures within 7 days after a previous dose of any pertussis vaccine (DTP, DTaP, or Tdap) Has seizures or another nervous system problem Has ever had Guillain-Barr Syndrome (also called "GBS") Has had severe pain or swelling after a previous dose of any vaccine that protects against tetanus or diphtheria In some cases, your health care provider may decide to postpone Tdap vaccination until a future visit. People with minor illnesses, such as a cold, may be vaccinated. People who are moderately or severely ill should usually wait until they recover before getting Tdap vaccine.  Your health care provider can give you more information. 4. Risks of a vaccine reaction Pain, redness, or swelling where the shot was given, mild fever, headache, feeling tired, and nausea, vomiting, diarrhea, or stomachache sometimes happen after Tdap vaccination. People sometimes faint after medical procedures, including vaccination. Tell your provider if you feel dizzy or have vision changes or ringing in the ears.  As with any medicine, there is a very remote chance of a vaccine causing a severe allergic reaction, other serious injury, or death. 5. What if there is a serious problem? An allergic reaction could occur after the vaccinated person leaves the clinic. If you see signs of a severe allergic reaction (hives, swelling of the face and throat, difficulty breathing, a fast heartbeat, dizziness, or weakness), call 9-1-1 and get the person to the nearest hospital. For other signs that concern you, call your health care provider.  Adverse reactions should be reported to the Vaccine Adverse Event Reporting System (VAERS). Your health care provider will usually file this report, or you  VAERS website at www.vaers.hhs.gov or call 1-800-822-7967. VAERS is only for reporting reactions, and VAERS staff  members do not give medical advice. 6. The National Vaccine Injury Compensation Program The National Vaccine Injury Compensation Program (VICP) is a federal program that was created to compensate people who may have been injured by certain vaccines. Claims regarding alleged injury or death due to vaccination have a time limit for filing, which may be as short as two years. Visit the VICP website at www.hrsa.gov/vaccinecompensation or call 1-800-338-2382to learn about the program and about filing a claim. 7. How can I learn more? Ask your health care provider. Call your local or state health department. Visit the website of the Food and Drug Administration (FDA) for vaccine package inserts and additional information at www.fda.gov/vaccines-blood-biologics/vaccines. Contact the Centers for Disease Control and Prevention (CDC): Call 1-800-232-4636 (1-800-CDC-INFO) or Visit CDC's website at www.cdc.gov/vaccines. Vaccine Information Statement Tdap (Tetanus, Diphtheria, Pertussis) Vaccine(11/21/2019) This information is not intended to replace advice given to you by your health care provider. Make sure you discuss any questions you have with your healthcare provider. Document Revised: 12/17/2019 Document Reviewed: 12/17/2019 Elsevier Patient Education  2022 [employer]. Calorie Counting for Weight Loss Calories are units of energy. Your body needs a certain number of calories from food to keep going throughout the day. When you eat or drink more calories than your body needs, your body stores the extra calories mostly as fat. When you eat or drink fewer calories than your body needs, your body burns fat to getthe energy it needs. Calorie counting means keeping track of how many calories you eat and drink each day. Calorie counting can be helpful if you need to lose weight. If you eat fewer calories than your body needs, you should lose weight. Ask yourhealth care provider what a healthy weight is for  you. For calorie counting to work, you will need to eat the right number of calories each day to lose a healthy amount of weight per week. A dietitian can help you figure out how many calories you need in a day and will suggest ways to reach your calorie goal. A healthy amount of weight to lose each week is usually 1-2 lb (0.5-0.9 kg). This usually means that your daily calorie intake should be reduced by 500-750 calories. Eating 1,200-1,500 calories a day can help most women lose weight. Eating 1,500-1,800 calories a day can help most men lose weight. What do I need to know about calorie counting? Work with your health care provider or dietitian to determine how many calories you should get each day. To meet your daily calorie goal, you will need to: Find out how many calories are in each food that you would like to eat. Try to do this before you eat. Decide how much of the food you plan to eat. Keep a food log. Do this by writing down what you ate and how many calories it had. To successfully lose weight, it is important to balance calorie counting with ahealthy lifestyle that includes regular activity. Where do I find calorie information?  The number of calories in a food can be found on a Nutrition Facts label. If a food does not have a Nutrition Facts label, try to look up the calories onlineor ask your dietitian for help. Remember that calories are listed per serving. If you choose to have more than one serving of a food, you will have to multiply the calories per serving by the number of servings   have to multiply the calories per serving by the number of servings you plan to eat. For example, the label on a package of bread might say that a serving size is 1 slice and that there are 90 calories in a serving. If you eat 1 slice, you will have eaten 90 calories. If you eat 2 slices, you will have eaten 180 calories. How do I keep a food log? After each time that you eat, record the following in your food log as soon as possible: What you ate. Be sure to  include toppings, sauces, and other extras on the food. How much you ate. This can be measured in cups, ounces, or number of items. How many calories were in each food and drink. The total number of calories in the food you ate. Keep your food log near you, such as in a pocket-sized notebook or on an app or website on your mobile phone. Some programs will calculate calories for you and show you how many calories you have left to meet your daily goal. What are some portion-control tips? Know how many calories are in a serving. This will help you know how many servings you can have of a certain food. Use a measuring cup to measure serving sizes. You could also try weighing out portions on a kitchen scale. With time, you will be able to estimate serving sizes for some foods. Take time to put servings of different foods on your favorite plates or in your favorite bowls and cups so you know what a serving looks like. Try not to eat straight from a food's packaging, such as from a bag or box. Eating straight from the package makes it hard to see how much you are eating and can lead to overeating. Put the amount you would like to eat in a cup or on a plate to make sure you are eating the right portion. Use smaller plates, glasses, and bowls for smaller portions and to prevent overeating. Try not to multitask. For example, avoid watching TV or using your computer while eating. If it is time to eat, sit down at a table and enjoy your food. This will help you recognize when you are full. It will also help you be more mindful of what and how much you are eating. What are tips for following this plan? Reading food labels Check the calorie count compared with the serving size. The serving size may be smaller than what you are used to eating. Check the source of the calories. Try to choose foods that are high in protein, fiber, and vitamins, and low in saturated fat, trans fat, and sodium. Shopping Read nutrition  labels while you shop. This will help you make healthy decisions about which foods to buy. Pay attention to nutrition labels for low-fat or fat-free foods. These foods sometimes have the same number of calories or more calories than the full-fat versions. They also often have added sugar, starch, or salt to make up for flavor that was removed with the fat. Make a grocery list of lower-calorie foods and stick to it. Cooking Try to cook your favorite foods in a healthier way. For example, try baking instead of frying. Use low-fat dairy products. Meal planning Use more fruits and vegetables. One-half of your plate should be fruits and vegetables. Include lean proteins, such as chicken, [place], and fish. Lifestyle Each week, aim to do one of the following: 150 minutes of moderate exercise, such as walking. 75 minutes  of vigorous exercise, such as running. General information Know how many calories are in the foods you eat most often. This will help you calculate calorie counts faster. Find a way of tracking calories that works for you. Get creative. Try different apps or programs if writing down calories does not work for you. What foods should I eat?  Eat nutritious foods. It is better to have a nutritious, high-calorie food, such as an avocado, than a food with few nutrients, such as a bag of potato chips. Use your calories on foods and drinks that will fill you up and will not leave you hungry soon after eating. Examples of foods that fill you up are nuts and nut butters, vegetables, lean proteins, and high-fiber foods such as whole grains. High-fiber foods are foods with more than 5 g of fiber per serving. Pay attention to calories in drinks. Low-calorie drinks include water and unsweetened drinks. The items listed above may not be a complete list of foods and beverages you can eat. Contact a dietitian for more information. What foods should I limit? Limit foods or drinks that are not good  sources of vitamins, minerals, or protein or that are high in unhealthy fats. These include: Candy. Other sweets. Sodas, specialty coffee drinks, alcohol, and juice. The items listed above may not be a complete list of foods and beverages you should avoid. Contact a dietitian for more information. How do I count calories when eating out? Pay attention to portions. Often, portions are much larger when eating out. Try these tips to keep portions smaller: Consider sharing a meal instead of getting your own. If you get your own meal, eat only half of it. Before you start eating, ask for a container and put half of your meal into it. When available, consider ordering smaller portions from the menu instead of full portions. Pay attention to your food and drink choices. Knowing the way food is cooked and what is included with the meal can help you eat fewer calories. If calories are listed on the menu, choose the lower-calorie options. Choose dishes that include vegetables, fruits, whole grains, low-fat dairy products, and lean proteins. Choose items that are boiled, broiled, grilled, or steamed. Avoid items that are buttered, battered, fried, or served with cream sauce. Items labeled as crispy are usually fried, unless stated otherwise. Choose water, low-fat milk, unsweetened iced tea, or other drinks without added sugar. If you want an alcoholic beverage, choose a lower-calorie option, such as a glass of wine or light beer. Ask for dressings, sauces, and syrups on the side. These are usually high in calories, so you should limit the amount you eat. If you want a salad, choose a garden salad and ask for grilled meats. Avoid extra toppings such as bacon, cheese, or fried items. Ask for the dressing on the side, or ask for olive oil and vinegar or lemon to use as dressing. Estimate how many servings of a food you are given. Knowing serving sizes will help you be aware of how much food you are eating at  restaurants. Where to find more information Centers for Disease Control and Prevention: FootballExhibition.com.br U.S. Department of Agriculture: WrestlingReporter.dk Summary Calorie counting means keeping track of how many calories you eat and drink each day. If you eat fewer calories than your body needs, you should lose weight. A healthy amount of weight to lose per week is usually 1-2 lb (0.5-0.9 kg). This usually means reducing your daily calorie intake by 500-750  calories. The number of calories in a food can be found on a Nutrition Facts label. If a food does not have a Nutrition Facts label, try to look up the calories online or ask your dietitian for help. Use smaller plates, glasses, and bowls for smaller portions and to prevent overeating. Use your calories on foods and drinks that will fill you up and not leave you hungry shortly after a meal. This information is not intended to replace advice given to you by your health care provider. Make sure you discuss any questions you have with your health care provider. Document Revised: 05/15/2019 Document Reviewed: 05/15/2019 Elsevier Patient Education  2022 [employer].

## 2021-05-16 NOTE — Progress Notes (Signed)
Pt is fasting  Needs note for work

## 2021-05-17 LAB — VITAMIN D 25 HYDROXY (VIT D DEFICIENCY, FRACTURES): Vit D, 25-Hydroxy: 9.6 ng/mL — ABNORMAL LOW (ref 30.0–100.0)

## 2021-05-18 ENCOUNTER — Ambulatory Visit: Payer: Medicaid Other | Attending: Primary Care | Admitting: Clinical

## 2021-05-18 ENCOUNTER — Encounter: Payer: Self-pay | Admitting: Clinical

## 2021-05-18 DIAGNOSIS — F411 Generalized anxiety disorder: Secondary | ICD-10-CM | POA: Diagnosis not present

## 2021-05-20 ENCOUNTER — Other Ambulatory Visit (INDEPENDENT_AMBULATORY_CARE_PROVIDER_SITE_OTHER): Payer: Self-pay | Admitting: Primary Care

## 2021-05-20 MED ORDER — ERGOCALCIFEROL 1.25 MG (50000 UT) PO CAPS: 50000.0000 [IU] | ORAL_CAPSULE | ORAL | 0 refills | Status: DC

## 2021-05-20 MED ORDER — VITAMIN D3 50 MCG (2000 UT) PO CAPS: 2000.0000 [IU] | ORAL_CAPSULE | Freq: Every day | ORAL | 1 refills | Status: AC

## 2021-05-23 NOTE — BH Specialist Note (Signed)
Integrated Behavioral Health Follow Up In-Person Visit  MRN: [id number] Name: Brianna Lee  Number of Integrated Behavioral Health Clinician visits:  13 Session Start time: 10:10am  Session End time: 11:00am Total time: 50  minutes  Types of Service: Individual psychotherapy  Interpretor:No. Interpretor Name and Language: N/A  Subjective: Brianna Lee is a 31 y.o. female accompanied by  self Patient was referred by Gwinda Passe, NP for depression and anxiety. Patient reports the following symptoms/concerns: Reports feeling anxious, worrying, and feeling down. Reports that her cousin was recently murdered.Report difficulty with accepting the loss. Reports that she also obtained a new job and will be leaving her current one. Duration of problem: 1+ year; Severity of problem: moderate  Objective: Mood: Anxious and Depressed and Affect: Appropriate Risk of harm to self or others: No plan to harm self or others  Life Context: Family and Social: Reports that she has a son and currently lives with her boyfriend. Reports that she was raised by her grandparents and her grandfather passed away in 2019-08-07. Reports that she and her boyfriend moved into a new apartment.  School/Work: Pt currently works full-time and has obtained a new job. Self-Care: Reports that she utilizes meditation and deep breathing. Reports creating a music playlist to decrease anxiousness. Reports that she journals.  Life Changes: Pt is overwhelmed with work and finances. Pt has obtained a new job. Pt's cousin was recently murdered.  Patient and/or Family's Strengths/Protective Factors: Social connections, Social and Emotional competence, Concrete supports in place (healthy food, safe environments, etc.), Sense of purpose, and Caregiver has knowledge of parenting & child development  Goals Addressed: Patient will:  Reduce symptoms of: anxiety   Increase knowledge and/or ability of: coping skills   Demonstrate  ability to: Increase healthy adjustment to current life circumstances and utilize coping skills taught in therapy to reduce symptoms, and identify triggers for trauma hx so that pt knows how to respond  Progress towards Goals: Ongoing  Interventions: Interventions utilized:  CBT Cognitive Behavioral Therapy and Supportive Counseling Standardized Assessments completed: Not Needed  Patient and/or Family Response: Pt receptive to tx. Pt receptive to psychoeducation provided on grief. Pt receptive to cognitive restructuring. Pt receptive to continuing healthy coping skills (journaling, listening to music, and meditation.   Patient Centered Plan: Patient is on the following Treatment Plan(s): Anxiety  Assessment: Denies SI/HI. Patient currently experiencing anxiety and a reaction to grief. Pt is having difficulty accepting the loss of cousin. Pt has obtained a new job and believes she will see a decrease in stress.   Patient may benefit from continued CBT. LCSWA provided psychoeducation on grief. LCSWA utilized Chartered certified accountant. LCSWA encouraged pt to continue healthy coping skills.  Plan: Follow up with behavioral health clinician on : 06/06/21 Behavioral recommendations: Continue healthy coping skills Referral(s): Integrated [employer] (In Clinic) "From scale of 1-10, how likely are you to follow plan?": 10  Rhone Ozaki C Esme Durkin, LCSW

## 2021-06-06 ENCOUNTER — Ambulatory Visit: Payer: Medicaid Other | Attending: Primary Care | Admitting: Clinical

## 2021-06-06 DIAGNOSIS — F411 Generalized anxiety disorder: Secondary | ICD-10-CM | POA: Diagnosis not present

## 2021-06-06 NOTE — BH Specialist Note (Signed)
Integrated Behavioral Health Follow Up In-Person Visit  MRN: [id number] Name: Brianna Lee  Number of Integrated Behavioral Health Clinician visits: Additional Visit  Session Start time: 1148   Session End time: 1225  Total time in minutes: 37   Types of Service: Individual psychotherapy  Interpretor:No. Interpretor Name and Language: N/A  Subjective: Brianna Lee is a 31 y.o. female accompanied by  self Patient was referred by Gwinda Passe, NP for anxiety and depression. Patient reports the following symptoms/concerns: Reports an improvement in an anxiety. Reports that she has obtained a new job and noticed a decrease in stress. Reports that she continues to have difficulty accepting the loss of her cousin and that she also had another cousin die.  Duration of problem: 1+ year; Severity of problem: moderate  Objective: Mood: Euthymic and Affect: Appropriate Risk of harm to self or others: No plan to harm self or others  Life Context: Family and Social: Reports that she has a son and currently lives with her boyfriend. Reports that she was raised by her grandparents and her grandfather passed away in Aug 28, 2019. Reports that she and her boyfriend moved into a new apartment.  School/Work: Pt currently works full-time and has obtained a new job. Self-Care: Reports that she utilizes meditation and deep breathing. Reports creating a music playlist to decrease anxiousness. Reports that she journals and exercises.  Life Changes: Pt has obtained a new job. Pt's cousin was recently murdered. Pt also lost another cousin due to physical health problems recently.  Patient and/or Family's Strengths/Protective Factors: Social connections, Social and Emotional competence, Concrete supports in place (healthy food, safe environments, etc.), Sense of purpose, and Caregiver has knowledge of parenting & child development  Goals Addressed: Patient will:  Reduce symptoms of: anxiety    Increase knowledge and/or ability of: coping skills   Demonstrate ability to: Increase healthy adjustment to current life circumstances and utilize coping skills taught in therapy to reduce symptoms, and identify triggers for trauma hx so that pt knows how to respond  Progress towards Goals: Ongoing  Interventions: Interventions utilized:  CBT Cognitive Behavioral Therapy and Supportive Counseling Standardized Assessments completed: Not Needed  Patient and/or Family Response: Pt receptive to tx. Pt receptive to psychoeducation provided on grief. Pt receptive to affirmation provided on pt's progress. Pt receptive to continuing healthy coping skills (journaling, listening to music, exercising, and meditation).  Patient Centered Plan: Patient is on the following Treatment Plan(s): Anxiety  Assessment: Denies SI/HI. Patient currently experiencing grief. Pt's anxiety appears to be improving as she continues to incorporate healthy coping skills and has obtained new employment..   Patient may benefit from continued CBT. LCSW provided psychoeducation on grief. LCSW provided affirmation and empowerment for pt's progress. LCSW encouraged pt to continue healthy coping skills.   Plan: Follow up with behavioral health clinician on : 06/27/21 Behavioral recommendations: Continue healthy coping skills (journaling, listening to music, exercising, and meditation) Referral(s): Integrated [employer] (In Clinic) "From scale of 1-10, how likely are you to follow plan?": 10  Brianna Tarleton C Valmai Vandenberghe, LCSW

## 2021-06-27 ENCOUNTER — Ambulatory Visit: Payer: Medicaid Other | Admitting: Clinical

## 2021-07-06 ENCOUNTER — Other Ambulatory Visit: Payer: Self-pay

## 2021-07-06 ENCOUNTER — Ambulatory Visit: Payer: Medicaid Other | Attending: Primary Care | Admitting: Clinical

## 2021-07-21 ENCOUNTER — Ambulatory Visit (INDEPENDENT_AMBULATORY_CARE_PROVIDER_SITE_OTHER): Payer: Medicaid Other | Admitting: Clinical

## 2021-08-03 ENCOUNTER — Telehealth: Payer: Self-pay | Admitting: Clinical

## 2021-08-03 NOTE — Telephone Encounter (Signed)
I attempted to call pt after receiving a vm. No answer, left vm.  ?

## 2021-08-12 ENCOUNTER — Telehealth: Payer: Self-pay | Admitting: Clinical

## 2021-08-12 NOTE — Telephone Encounter (Signed)
I contacted pt and informed her of my departure from [facility] and [facility]. I referred pt to [facility] and will send pt this information to have. ?

## 2021-08-15 ENCOUNTER — Ambulatory Visit (INDEPENDENT_AMBULATORY_CARE_PROVIDER_SITE_OTHER): Payer: Medicaid Other | Admitting: Primary Care

## 2021-08-22 ENCOUNTER — Encounter (INDEPENDENT_AMBULATORY_CARE_PROVIDER_SITE_OTHER): Payer: Self-pay | Admitting: Primary Care

## 2021-08-22 ENCOUNTER — Ambulatory Visit (INDEPENDENT_AMBULATORY_CARE_PROVIDER_SITE_OTHER): Payer: Medicaid Other | Admitting: Primary Care

## 2021-08-22 DIAGNOSIS — E559 Vitamin D deficiency, unspecified: Secondary | ICD-10-CM

## 2021-08-22 DIAGNOSIS — I10 Essential (primary) hypertension: Secondary | ICD-10-CM

## 2021-08-22 DIAGNOSIS — R0981 Nasal congestion: Secondary | ICD-10-CM | POA: Diagnosis not present

## 2021-08-22 MED ORDER — PHENTERMINE HCL 37.5 MG PO CAPS
37.5000 mg | ORAL_CAPSULE | ORAL | 1 refills | Status: DC
Start: 2021-08-22 — End: 2021-09-19

## 2021-08-22 MED ORDER — NIFEDIPINE ER 30 MG PO TB24: 30.0000 mg | ORAL_TABLET | Freq: Every day | ORAL | 1 refills | Status: DC

## 2021-08-22 MED ORDER — FLUTICASONE PROPIONATE 50 MCG/ACT NA SUSP: 2.0000 | Freq: Every day | NASAL | 6 refills | Status: AC

## 2021-08-22 MED ORDER — CETIRIZINE HCL 10 MG PO TABS: 10.0000 mg | ORAL_TABLET | Freq: Every day | ORAL | 1 refills | Status: AC

## 2021-08-22 NOTE — Patient Instructions (Signed)
Phentermine Capsules or Tablets ?What is this medication? ?PHENTERMINE (FEN ter meen) promotes weight loss. It works by decreasing appetite. It is often used for a short period of time. Changes to diet and exercise are often combined with this medication. ?This medicine may be used for other purposes; ask your health care provider or pharmacist if you have questions. ?COMMON BRAND NAME(S): Adipex-P, Atti-Plex P, Atti-Plex P Spansule, Fastin, Lomaira, Pro-Fast, Pro-Fast HS, Pro-Fast SA, Tara-8 ?What should I tell my care team before I take this medication? ?They need to know if you have any of these conditions: ?Agitation or nervousness ?Diabetes ?Glaucoma ?Heart disease ?High blood pressure ?History of drug abuse or addiction ?History of stroke ?Kidney disease ?Lung disease called Primary Pulmonary Hypertension (PPH) ?Taken an MAOI like Carbex, Eldepryl, Marplan, Nardil, or Parnate in last 14 days ?Taking stimulant medications for attention disorders, weight loss, or to stay awake ?Thyroid disease ?An unusual or allergic reaction to phentermine, other medications, foods, dyes, or preservatives ?Pregnant or trying to get pregnant ?Breast-feeding ?How should I use this medication? ?Take this medication by mouth with a glass of water. Follow the directions on the prescription label. Take your medication at regular intervals. Do not take it more often than directed. Do not stop taking except on your care team's advice. ?Talk to your care team about the use of this medication in children. While this medication may be prescribed for children 17 years or older for selected conditions, precautions do apply. ?Overdosage: If you think you have taken too much of this medicine contact a poison control center or emergency room at once. ?NOTE: This medicine is only for you. Do not share this medicine with others. ?What if I miss a dose? ?If you miss a dose, take it as soon as you can. If it is almost time for your next dose, take  only that dose. Do not take double or extra doses. ?What may interact with this medication? ?Do not take this medication with any of the following: ?MAOIs like Carbex, Eldepryl, Marplan, Nardil, and Parnate ?This medication may also interact with the following: ?Alcohol ?Certain medications for depression, anxiety, or psychotic disorders ?Certain medications for high blood pressure ?Linezolid ?Medications for colds or breathing difficulties like pseudoephedrine or phenylephrine ?Medications for diabetes ?Sibutramine ?Stimulant medications for attention disorders, weight loss, or to stay awake ?This list may not describe all possible interactions. Give your health care provider a list of all the medicines, herbs, non-prescription drugs, or dietary supplements you use. Also tell them if you smoke, drink alcohol, or use illegal drugs. Some items may interact with your medicine. ?What should I watch for while using this medication? ?Visit your care team for regular checks on your progress. ?Do not stop taking except on your care team's advice. You may develop a severe reaction. Your care team will tell you how much medication to take. ?Do not take this medication close to bedtime. It may prevent you from sleeping. ?You may get drowsy or dizzy. Do not drive, use machinery, or do anything that needs mental alertness until you know how this medication affects you. Do not stand or sit up quickly, especially if you are an older patient. This reduces the risk of dizzy or fainting spells. Alcohol may increase dizziness and drowsiness. Avoid alcoholic drinks. ?This medication may affect blood sugar levels. Ask your care team if changes in diet or medications are needed if you have diabetes. ?Women should inform their care team if they wish to become   pregnant or think they might be pregnant. Losing weight while pregnant is not advised and may cause harm to the unborn child. Talk to your care team for more information. ?What side  effects may I notice from receiving this medication? ?Side effects that you should report to your care team as soon as possible: ?Allergic reactions--skin rash, itching, hives, swelling of the face, lips, tongue, or throat ?Heart failure--shortness of breath, swelling of the ankles, feet, or hands, sudden weight gain, unusual weakness or fatigue ?Pulmonary hypertension--shortness of breath, chest pain, fast or irregular heartbeat, feeling faint or lightheaded, fatigue, swelling of the ankles or feet ?Side effects that usually do not require medical attention (report to your care team if they continue or are bothersome): ?Change in taste ?Diarrhea ?Dizziness ?Dry mouth ?Restlessness ?Trouble sleeping ?This list may not describe all possible side effects. Call your doctor for medical advice about side effects. You may report side effects to FDA at 1-800-FDA-1088. ?Where should I keep my medication? ?Keep out of the reach of children. This medication can be abused. Keep your medication in a safe place to protect it from theft. Do not share this medication with anyone. Selling or giving away this medication is dangerous and against the law. ?This medication may cause harm and death if it is taken by other adults, children, or pets. Return medication that has not been used to an official disposal site. Contact the DEA at 1-800-882-9539 or your city/county government to find a site. If you cannot return the medication, mix any unused medication with a substance like cat litter or coffee grounds. Then throw the medication away in a sealed container like a sealed bag or coffee can with a lid. Do not use the medication after the expiration date. ?Store at room temperature between 20 and 25 degrees C (68 and 77 degrees F). Keep container tightly closed. ?NOTE: This sheet is a summary. It may not cover all possible information. If you have questions about this medicine, talk to your doctor, pharmacist, or health care  provider. ?? 2023 Elsevier/Gold Standard (2021-03-04 00:00:00) ? ?

## 2021-08-22 NOTE — Progress Notes (Signed)
?[place] ? ?Brianna Lee, is a 31 y.o. female ? ?[id number] ? ?[id number] ? ?DOB - [birth year] ? ?Chief Complaint  ?Patient presents with  ? weight loss management  ?    ? ?Subjective:  ?Brianna Lee is a 31 y.o. female here today for a follow up visit of weight management previous visit we made an agreement on follow-up she would lose 10 pounds.  She has achieved that goal with diet and exercise. Presents for starting weight loss medication Phentermine. She understands if any palpitations, anxiety, trouble sleeping,or  elevated BP stop medication immediately and call the office. ? Her blood pressure is unremarkable .  She is suffering from allergies with watery runny itchy eyes and throat irritation.  Patient has No headache, No chest pain, No abdominal pain - No Nausea, No new weakness tingling or numbness, No Cough - shortness of breath ? ?No problems updated. ? ?Allergies  ?Allergen Reactions  ? Naproxen Swelling  ? ? ?Past Medical History:  ?Diagnosis Date  ? BMI 45.0-49.9, adult ([place]) 05/13/2020  ? COVID-19 affecting pregnancy in second trimester 04/29/2020  ? Hypertension   ? Scoliosis   ? ? ?Current Outpatient Medications on File Prior to Visit  ?Medication Sig Dispense Refill  ? Blood Pressure Monitoring (BLOOD PRESSURE MONITOR AUTOMAT) DEVI 1 Device by Does not apply route daily. Automatic blood pressure cuff regular size. To monitor blood pressure regularly at home. ICD-10 code:Z34.90 1 each 0  ? Cholecalciferol (VITAMIN D3) 50 MCG (2000 UT) capsule Take 1 capsule (2,000 Units total) by mouth daily. 90 capsule 1  ? ergocalciferol (VITAMIN D2) 1.25 MG (50000 UT) capsule Take 1 capsule (50,000 Units total) by mouth once a week. 12 capsule 0  ? ferrous sulfate 325 (65 FE) MG tablet Take 1 tablet (325 mg total) by mouth every other day. 21 tablet 0  ? ?No current facility-administered medications on file prior to visit.  ? ?Comprehensive ROS Pertinent positive and negative noted in HPI    ?Objective:  ? ?Vitals:  ? 08/22/21 1011  ?BP: 121/87  ?Pulse: 65  ?Temp: 98.2 ?F (36.8 ?C)  ?TempSrc: Oral  ?SpO2: 95%  ?Weight: 248 lb 12.8 oz (112.9 kg)  ?Height: 5' 4" (1.626 m)  ? ? ?Exam ?General appearance : Awake, alert, not in any distress. Speech Clear. Not toxic looking ?HEENT: Atraumatic and Normocephalic, pupils equally reactive to light and accomodation.  Frontal and maxillary tenderness, red boggy nares and red throat without exudate. ?Neck: Supple, no JVD. No cervical lymphadenopathy.  ?Chest: Good air entry bilaterally, no added sounds  ?CVS: S1 S2 regular, no murmurs.  ?Abdomen: Bowel sounds present, Non tender and not distended with no gaurding, rigidity or rebound. ?Extremities: B/L Lower Ext shows no edema, both legs are warm to touch ?Neurology: Awake alert, and oriented X 3, Non focal ?Skin: No Rash ? ?Data Review ?Lab Results  ?Component Value Date  ? HGBA1C <4.2 (L) 12/09/2019  ? ? ?Assessment & Plan  ?Brianna Lee was seen today for weight loss management. ? ?Diagnoses and all orders for this visit: ? ?Morbid obesity ([place]) ?Class 3 severe obesity due to excess  calories without serious comorbidity with body mass index (BMI) of 40.0 to 44.9 in adult [facility]) ?Started caloric reduction, exercise and behavioral modification recommended. Goal met of 10lbs weight loss before current visit. ?-     phentermine 37.5 MG capsule; Take 1 capsule (37.5 mg total) by mouth every morning. ? ?Essential hypertension ?Well control on monotherapy Procardia 30  xr daily. Continue , low-sodium, DASH diet, medication compliance, 150 minutes of moderate intensity exercise per week. ?Discussed medication compliance, adverse effects.  ? ?Vitamin D deficiency ?Continue OTC 2000 ?iu daily will ck vit. D levels in 2 months. ? ?Sinus congestion ?-     fluticasone (FLONASE) 50 MCG/ACT nasal spray; Place 2 sprays into both nostrils daily. ?-     cetirizine (ZYRTEC ALLERGY) 10 MG tablet; Take 1 tablet (10 mg total) by mouth  daily. ? ?There are no diagnoses linked to this encounter. ? ? ?Patient have been counseled extensively about nutrition and exercise. Other issues discussed during this visit include: low cholesterol diet, weight control and daily exercise, foot care, annual eye examinations at Ophthalmology, importance of adherence with medications and regular follow-up. We also discussed long term complications of uncontrolled diabetes and hypertension.  ? ?Return for medical conditions. ? ?The patient was given clear instructions to go to ER or return to medical center if symptoms don't improve, worsen or new problems develop. The patient verbalized understanding. The patient was told to call to get lab results if they haven't heard anything in the next week.  ? ?This note has been created with Surveyor, quantity. Any transcriptional errors are unintentional.  ? ?Kerin Perna, NP ?08/22/2021, 11:19 AM  ?

## 2021-09-19 ENCOUNTER — Encounter (INDEPENDENT_AMBULATORY_CARE_PROVIDER_SITE_OTHER): Payer: Self-pay | Admitting: Primary Care

## 2021-09-19 ENCOUNTER — Ambulatory Visit (INDEPENDENT_AMBULATORY_CARE_PROVIDER_SITE_OTHER): Payer: Medicaid Other | Admitting: Primary Care

## 2021-09-19 MED ORDER — PHENTERMINE HCL 37.5 MG PO CAPS: 37.5000 mg | ORAL_CAPSULE | ORAL | 1 refills | Status: DC

## 2021-09-19 NOTE — Progress Notes (Signed)
[facility]   Brianna Lee, is a 31 y.o. female presents for a follow up after starting on phentermine for weight loss for 3 months. Patient states they have improved meal pattern, fewer sweetened foods & beverages, increased physical activity, adequate fluid intake (at least 6 cups of fluid per day), and choose foods appropriate for diet consistency. While on the phentermine they have lost 6 lbs since last visit. They deny palpitations, anxiety, trouble sleeping, elevated BP.   BP Readings from Last 3 Encounters:  09/19/21 119/84  08/22/21 121/87  05/16/21 136/88    Wt Readings from Last 3 Encounters:  09/19/21 242 lb 12.8 oz (110.1 kg)  08/22/21 248 lb 12.8 oz (112.9 kg)  05/16/21 259 lb 12.8 oz (117.8 kg)    Typical breakfast: Typical lunch:  Typical dinner:  Medications: Current Outpatient Medications on File Prior to Visit  Medication Sig Dispense Refill   Blood Pressure Monitoring (BLOOD PRESSURE MONITOR AUTOMAT) DEVI 1 Device by Does not apply route daily. Automatic blood pressure cuff regular size. To monitor blood pressure regularly at home. ICD-10 code:Z34.90 1 each 0   cetirizine (ZYRTEC ALLERGY) 10 MG tablet Take 1 tablet (10 mg total) by mouth daily. 90 tablet 1   Cholecalciferol (VITAMIN D3) 50 MCG (2000 UT) capsule Take 1 capsule (2,000 Units total) by mouth daily. 90 capsule 1   ergocalciferol (VITAMIN D2) 1.25 MG (50000 UT) capsule Take 1 capsule (50,000 Units total) by mouth once a week. 12 capsule 0   fluticasone (FLONASE) 50 MCG/ACT nasal spray Place 2 sprays into both nostrils daily. 16 g 6   NIFEdipine (ADALAT CC) 30 MG 24 hr tablet Take 1 tablet (30 mg total) by mouth daily. 90 tablet 1   phentermine 37.5 MG capsule Take 1 capsule (37.5 mg total) by mouth every morning. 30 capsule 1   ferrous sulfate 325 (65 FE) MG tablet Take 1 tablet (325 mg total) by mouth every other day. 21 tablet 0   No current facility-administered medications  on file prior to visit.    ROS:   Denies any headaches, blurred vision, fatigue, shortness of breath, chest pain, abdominal pain, abnormal vaginal discharge/itching/odor/irritation, problems with periods, bowel movements, urination, or intercourse unless otherwise stated above.  Physical exam:  Vitals:   09/19/21 1618  BP: 119/84  Pulse: 94  Temp: 98.1 F (36.7 C)  SpO2: 99%    BP 119/84   Pulse 94   Temp 98.1 F (36.7 C) (Oral)   Ht 5\' 4"  (1.626 m)   Wt 242 lb 12.8 oz (110.1 kg)   SpO2 99%   Breastfeeding No   BMI 41.68 kg/m  General appearance: alert, no distress, and morbidly obese Head: Normocephalic, without obvious abnormality, atraumatic Eyes: conjunctivae/corneas clear. PERRL, EOM's intact. Fundi benign. Nose: Nares normal. Septum midline. Mucosa normal. No drainage or sinus tenderness. Neck: no adenopathy, no carotid bruit, no JVD, supple, symmetrical, trachea midline, and thyroid not enlarged, symmetric, no tenderness/mass/nodules Lungs: clear to auscultation bilaterally Heart: regular rate and rhythm, S1, S2 normal, no murmur, click, rub or gallop Abdomen: soft, non-tender; bowel sounds normal; no masses,  no organomegaly Extremities: extremities normal, atraumatic, no cyanosis or edema Skin: Skin color, texture, turgor normal. No rashes or lesions Neurologic: Alert and oriented X 3, normal strength and tone. Normal symmetric reflexes. Normal coordination and gait  Assessment and Plan: Obesity with co morbid conditions.  Brianna Lee was seen today for follow-up.  Diagnoses and all orders for this visit:  Morbid obesity ([facility]) -     phentermine 37.5 MG capsule; Take 1 capsule (37.5 mg total) by mouth every morning.      This note has been created with Education officer, environmental. Any transcriptional errors are unintentional.   Grayce Sessions, NP 09/19/2021, 4:26 PM

## 2021-09-24 IMAGING — US US MFM OB DETAIL+14 WK
1 series · 13 of 28 positions shown · non-contrast
Comparison: none

[Series 1: us mfm ob detail+14 wk · 13 of 154 slices shown]
[im 6/154]
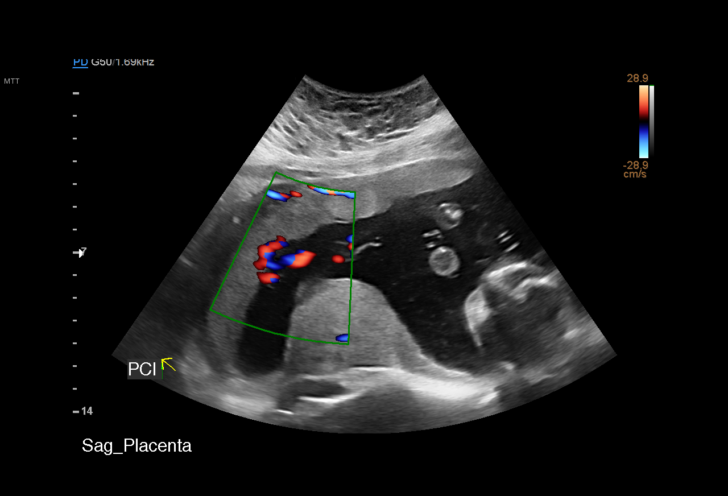
[im 18/154]
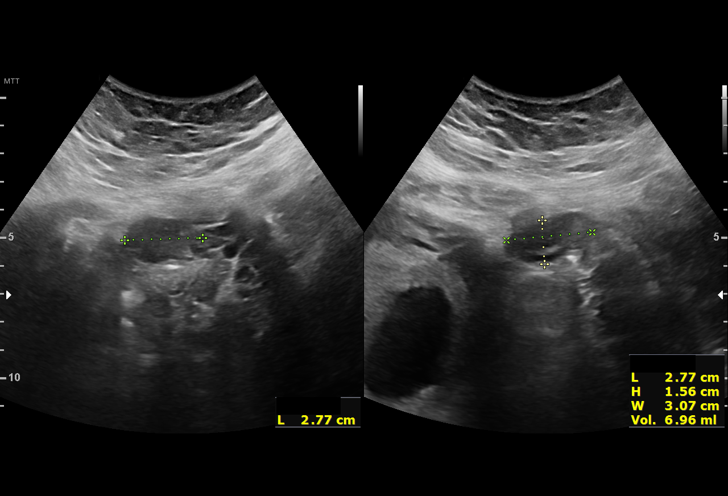
[im 29/154]
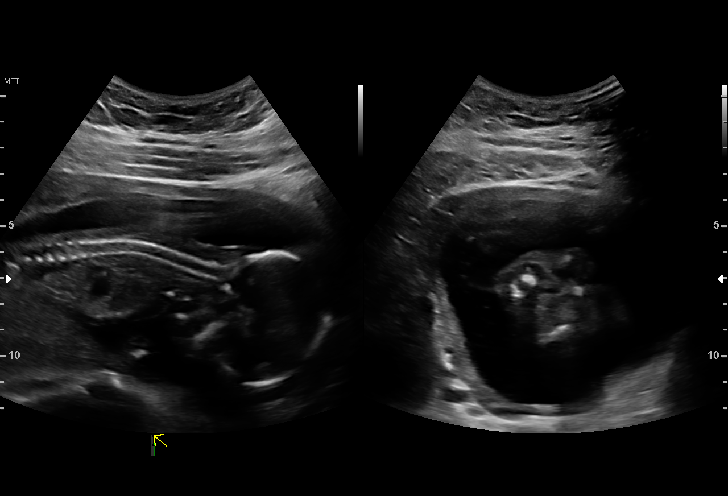
[im 40/154]
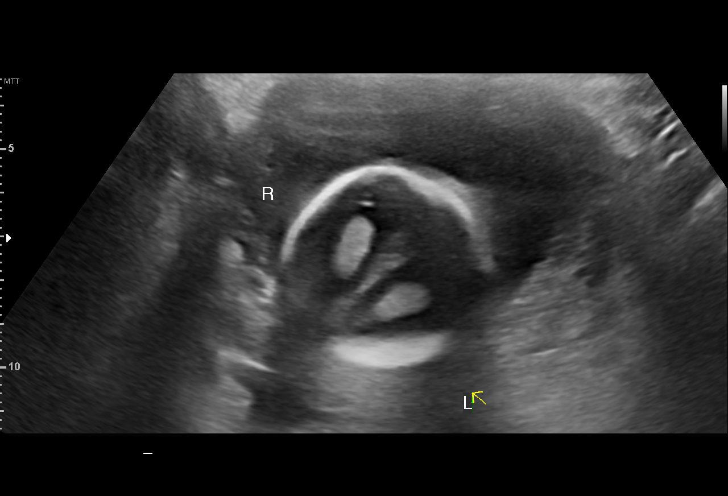
[im 52/154]
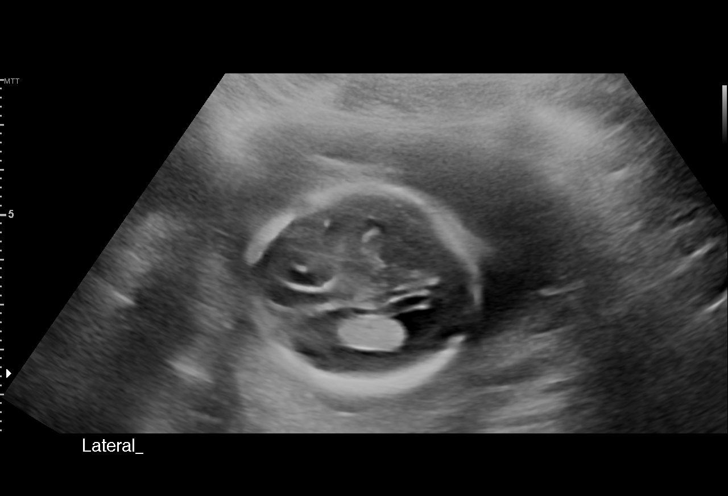
[im 63/154]
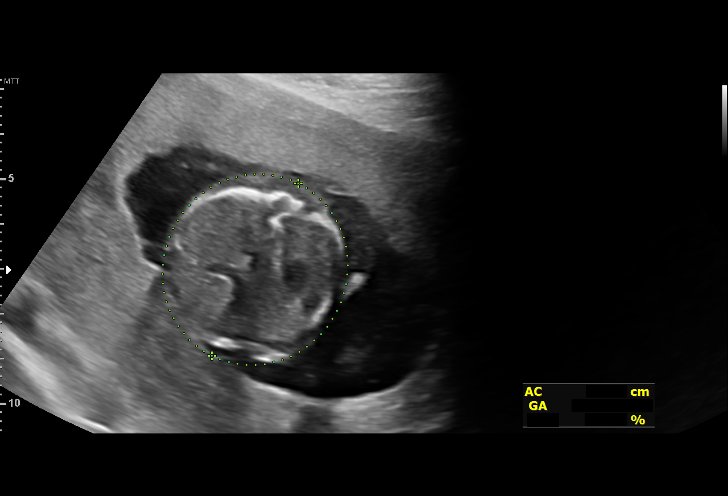
[im 80/154]
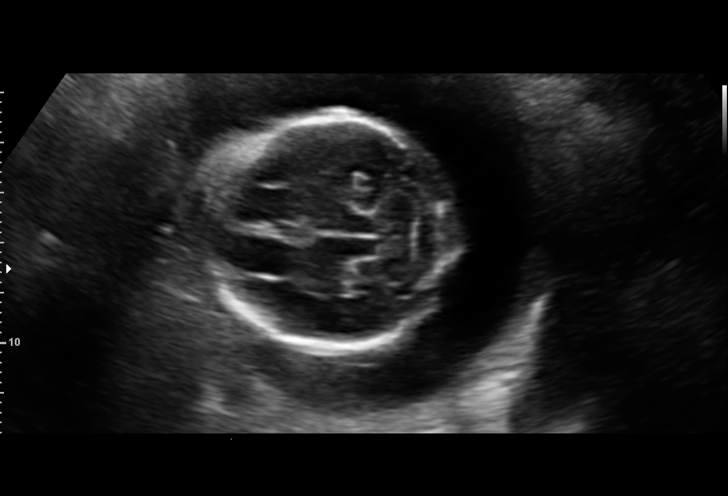
[im 91/154]
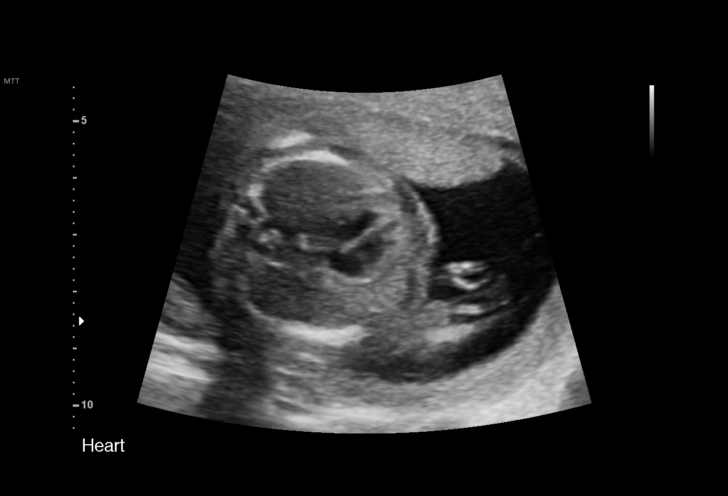
[im 103/154]
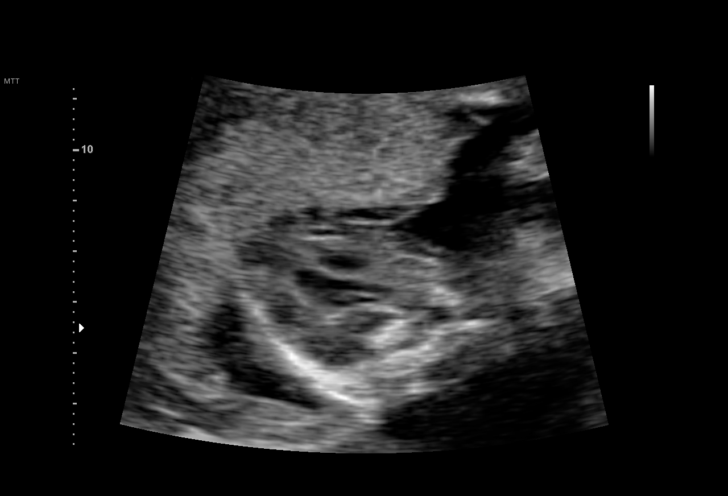
[im 114/154]
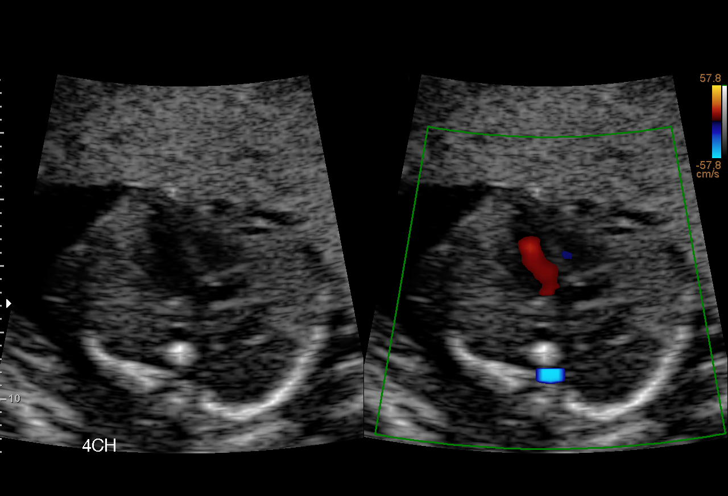
[im 125/154]
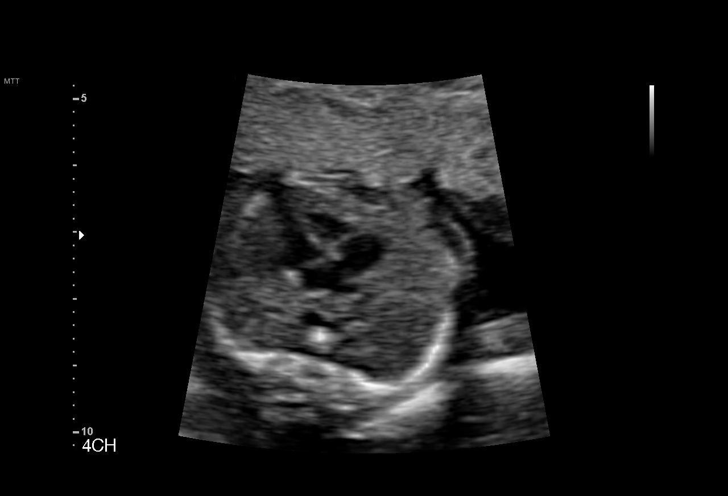
[im 137/154]
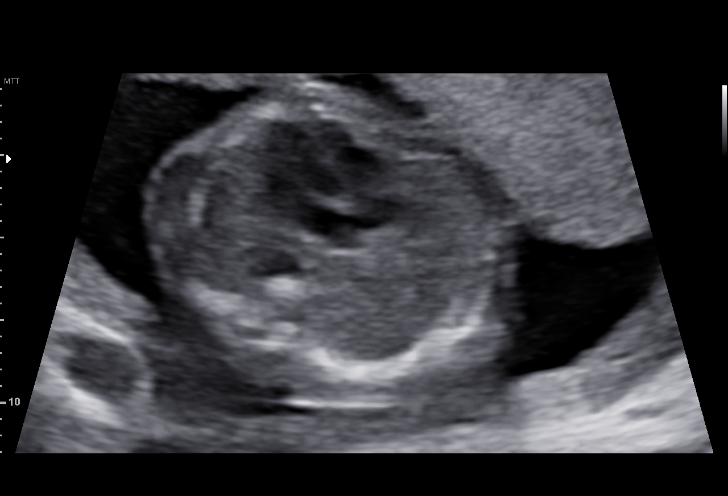
[im 148/154]
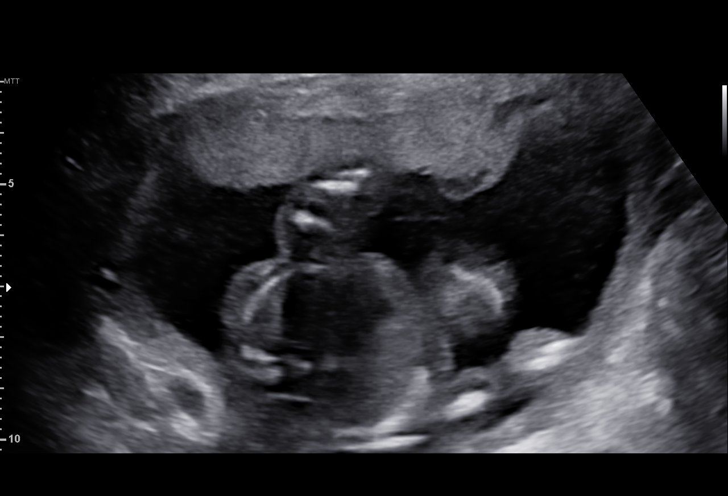

[13 of 28 positions shown; findings below may reference images not displayed]

Indications

 Obesity complicating pregnancy, second
 trimester (BMI 45)
 19 weeks gestation of pregnancy
 Encounter for antenatal screening for
 malformations
 Genetic carrier ([facility], [facility])
Fetal Evaluation

 Num Of Fetuses:         1
 Fetal Heart Rate(bpm):  152
 Cardiac Activity:       Observed
 Presentation:           Cephalic
 Placenta:               Anterior Fundal
 P. Cord Insertion:      Previously Visualized

 Amniotic Fluid
 AFI FV:      Within normal limits

                             Largest Pocket(cm)

Biometry

 BPD:      45.3  mm     G. Age:  19w 5d         79  %    CI:        82.43   %    70 - 86
                                                         FL/HC:      17.7   %    16.1 -
 HC:      157.4  mm     G. Age:  18w 4d         25  %    HC/AC:      1.19        1.09 -
 AC:      132.7  mm     G. Age:  18w 5d         38  %    FL/BPD:     61.4   %
 FL:       27.8  mm     G. Age:  18w 4d         26  %    FL/AC:      20.9   %    20 - 24
 HUM:      27.5  mm     G. Age:  18w 6d         44  %
 CER:      19.2  mm     G. Age:  18w 5d         23  %
 NFT:       5.4  mm

 LV:        7.8  mm
 CM:          4  mm
 Est. FW:     252  gm      0 lb 9 oz     28  %
OB History

 Gravidity:    2          SAB:   1
Gestational Age

 LMP:           22w 3d        Date:  09/23/19                 EDD:   06/29/20
 U/S Today:     18w 6d                                        EDD:   07/24/20
 Best:          19w 0d     Det. By:  Previous Ultrasound      EDD:   07/23/20
                                     (12/16/19)
Anatomy

 Cranium:               Brachycephaly          Aortic Arch:            Appears normal
 Cavum:                 Appears normal         Ductal Arch:            Appears normal
 Ventricles:            Appears normal         Diaphragm:              Appears normal
 Choroid Plexus:        Appears normal         Stomach:                Appears normal, left
                                                                       sided
 Cerebellum:            Appears normal         Abdomen:                Appears normal
 Posterior Fossa:       Appears normal         Abdominal Wall:         Appears nml (cord
                                                                       insert, abd wall)
 Nuchal Fold:           Appears normal         Cord Vessels:           Appears normal (3
                                                                       vessel cord)
 Face:                  Orbits nl; profile not Kidneys:                Appear normal
                        well visualized
 Lips:                  Not well visualized    Bladder:                Appears normal
 Thoracic:              Appears normal         Spine:                  Limited views
                                                                       appear normal
 Heart:                 Not well visualized    Upper Extremities:      Appears normal
 RVOT:                  Appears normal         Lower Extremities:      Appears normal
 LVOT:                  Appears normal

 Other:  Fetus appears to be a male. Nasal bone visualized. Heels/feet and
         open hands/5th digits visualized.
Cervix Uterus Adnexa

 Cervix
 Length:           3.78  cm.
 Not visualized (advanced GA >49wks)

 Uterus
 No abnormality visualized.

 Right Ovary
 Within normal limits. No adnexal mass visualized.

 Left Ovary
 Within normal limits. No adnexal mass visualized.

 Cul De Sac
 No free fluid seen.

 Adnexa
 No abnormality visualized.
Comments

 This patient was seen for a detailed fetal anatomy scan due
 to maternal obesity.
 She denies any significant past medical history and denies
 any problems in her current pregnancy.
 She had a cell free DNA test earlier in her pregnancy which
 indicated a low risk for trisomy 21, 18, and 13. A male fetus is
 predicted.
 She was informed that the fetal growth and amniotic fluid
 level were appropriate for her gestational age.
 There were no obvious fetal anomalies noted on today's
 ultrasound exam.  However, the views of the fetal anatomy
 were limited today due to the fetal position.
 The patient was informed that anomalies may be missed due
 to technical limitations. If the fetus is in a suboptimal position
 or maternal habitus is increased, visualization of the fetus in
 the maternal uterus may be impaired.
 A follow-up exam was scheduled in 4 weeks to complete the
 views of the fetal anatomy.

## 2021-09-26 ENCOUNTER — Encounter (INDEPENDENT_AMBULATORY_CARE_PROVIDER_SITE_OTHER): Payer: Self-pay | Admitting: Primary Care

## 2021-10-19 ENCOUNTER — Ambulatory Visit (INDEPENDENT_AMBULATORY_CARE_PROVIDER_SITE_OTHER): Payer: Medicaid Other | Admitting: Primary Care

## 2021-10-25 ENCOUNTER — Encounter (INDEPENDENT_AMBULATORY_CARE_PROVIDER_SITE_OTHER): Payer: Self-pay | Admitting: Primary Care

## 2021-10-25 ENCOUNTER — Ambulatory Visit (INDEPENDENT_AMBULATORY_CARE_PROVIDER_SITE_OTHER): Payer: BC Managed Care – PPO | Admitting: Primary Care

## 2021-10-25 DIAGNOSIS — Z6841 Body Mass Index (BMI) 40.0 and over, adult: Secondary | ICD-10-CM | POA: Diagnosis not present

## 2021-10-29 NOTE — Progress Notes (Signed)
[facility]  Brianna Lee, is a 31 y.o. female  [id number]  [id number]  DOB - 09-Oct-1990  Chief Complaint  Patient presents with   Follow-up    Weight management       Subjective:   Brianna Lee is a 31 y.o. female here today for a follow up visit for weight management and currently prescribed phentermine  she denies palpitations, anxiety, trouble sleeping, elevated BP .Patient has No headache, No chest pain, No abdominal pain - No Nausea, No new weakness tingling or numbness, No Cough - shortness of breath. She does admit to enjoying her birthday, 4th of July and preparing to go on a cruise she's only lost 2 lbs. Discussed and patient agreed continue to enjoy her birthday and cruise and follow up in a month and re-evaluate medication this month she will stop no refills until f/u visit   No problems updated.  Allergies  Allergen Reactions   Naproxen Swelling    Past Medical History:  Diagnosis Date   BMI 45.0-49.9, adult ([facility]) 05/13/2020   COVID-19 affecting pregnancy in second trimester 04/29/2020   Hypertension    Scoliosis     Current Outpatient Medications on File Prior to Visit  Medication Sig Dispense Refill   Blood Pressure Monitoring (BLOOD PRESSURE MONITOR AUTOMAT) DEVI 1 Device by Does not apply route daily. Automatic blood pressure cuff regular size. To monitor blood pressure regularly at home. ICD-10 code:Z34.90 1 each 0   cetirizine (ZYRTEC ALLERGY) 10 MG tablet Take 1 tablet (10 mg total) by mouth daily. 90 tablet 1   Cholecalciferol (VITAMIN D3) 50 MCG (2000 UT) capsule Take 1 capsule (2,000 Units total) by mouth daily. 90 capsule 1   fluticasone (FLONASE) 50 MCG/ACT nasal spray Place 2 sprays into both nostrils daily. 16 g 6   NIFEdipine (ADALAT CC) 30 MG 24 hr tablet Take 1 tablet (30 mg total) by mouth daily. 90 tablet 1   phentermine 37.5 MG capsule Take 1 capsule (37.5 mg total) by mouth every morning. 30 capsule 1   ferrous sulfate 325  (65 FE) MG tablet Take 1 tablet (325 mg total) by mouth every other day. 21 tablet 0   No current facility-administered medications on file prior to visit.    Objective:   Vitals:   10/25/21 1550  BP: 118/84  Pulse: 74  Temp: 97.8 F (36.6 C)  TempSrc: Oral  SpO2: 98%  Weight: 240 lb 9.6 oz (109.1 kg)  Height: 5\' 4"  (1.626 m)    Exam General appearance : Awake, alert, not in any distress. Speech Clear. Not toxic looking HEENT: Atraumatic and Normocephalic, pupils equally reactive to light and accomodation Neck: Supple, no JVD. No cervical lymphadenopathy.  Chest: Good air entry bilaterally, no added sounds  CVS: S1 S2 regular, no murmurs.  Abdomen: Bowel sounds present, Non tender and not distended with no gaurding, rigidity or rebound. Extremities: B/L Lower Ext shows no edema, both legs are warm to touch Neurology: Awake alert, and oriented X 3, CN II-XII intact, Non focal Skin: No Rash  Data Review Lab Results  Component Value Date   HGBA1C <4.2 (L) 12/09/2019    Assessment & Plan   Morbid obesity ([facility]) 1 month break from phentermine return on f/u and re-evaluate weight management.   Patient have been counseled extensively about nutrition and exercise. Other issues discussed during this visit include: low cholesterol diet, weight control and daily exercise, foot care, annual eye examinations at Ophthalmology, importance of adherence with medications  and regular follow-up. We also discussed long term complications of uncontrolled diabetes and hypertension.   Return in about 4 weeks (around 11/22/2021) for weight management .  The patient was given clear instructions to go to ER or return to medical center if symptoms don't improve, worsen or new problems develop. The patient verbalized understanding. The patient was told to call to get lab results if they haven't heard anything in the next week.   This note has been created with Print production planner. Any transcriptional errors are unintentional.   Grayce Sessions, NP 10/29/2021, 10:37 PM

## 2021-11-23 ENCOUNTER — Ambulatory Visit (INDEPENDENT_AMBULATORY_CARE_PROVIDER_SITE_OTHER): Payer: BC Managed Care – PPO | Admitting: Primary Care

## 2021-11-23 ENCOUNTER — Encounter (INDEPENDENT_AMBULATORY_CARE_PROVIDER_SITE_OTHER): Payer: Self-pay | Admitting: Primary Care

## 2021-11-23 DIAGNOSIS — I10 Essential (primary) hypertension: Secondary | ICD-10-CM | POA: Diagnosis not present

## 2021-11-23 MED ORDER — NIFEDIPINE ER 30 MG PO TB24: 30.0000 mg | ORAL_TABLET | Freq: Every day | ORAL | 1 refills | Status: DC

## 2021-11-23 MED ORDER — PHENTERMINE HCL 37.5 MG PO CAPS: 37.5000 mg | ORAL_CAPSULE | ORAL | 1 refills | Status: DC

## 2021-11-23 NOTE — Progress Notes (Signed)
[facility]   Brianna Lee, is a 31 y.o. female morbid obese presents for a follow up to restart her phentermine.  Last visit suggested to stop medication due to several special events taking place family gatherings, taking a cruise, birthdays, and other festivities taking place.  Advise that you do not set yourself up to fail but to succeed.  Today she will be starting back on phentermine for weight loss.. Patient states they have better variety, improved meal pattern, less frequent dining out, decreased fat intake, fewer sweetened foods & beverages, and will take vitamin/mineral supplement.  Previously on phentermine she denied palpitations, anxiety, trouble sleeping, elevated BP.   BP Readings from Last 3 Encounters:  11/23/21 123/84  10/25/21 118/84  09/19/21 119/84    Wt Readings from Last 3 Encounters:  11/23/21 239 lb 6.4 oz (108.6 kg)  10/25/21 240 lb 9.6 oz (109.1 kg)  09/19/21 242 lb 12.8 oz (110.1 kg)   She has been off course, her meals and intake for the last couple months.  She will start counting calories watching carbs, making better food choices, reduction in eating out and incorporate exercise into her daily activities.  Medications: Current Outpatient Medications on File Prior to Visit  Medication Sig Dispense Refill   Blood Pressure Monitoring (BLOOD PRESSURE MONITOR AUTOMAT) DEVI 1 Device by Does not apply route daily. Automatic blood pressure cuff regular size. To monitor blood pressure regularly at home. ICD-10 code:Z34.90 1 each 0   cetirizine (ZYRTEC ALLERGY) 10 MG tablet Take 1 tablet (10 mg total) by mouth daily. 90 tablet 1   Cholecalciferol (VITAMIN D3) 50 MCG (2000 UT) capsule Take 1 capsule (2,000 Units total) by mouth daily. 90 capsule 1   fluticasone (FLONASE) 50 MCG/ACT nasal spray Place 2 sprays into both nostrils daily. 16 g 6   NIFEdipine (ADALAT CC) 30 MG 24 hr tablet Take 1 tablet (30 mg total) by mouth daily. 90 tablet 1    phentermine 37.5 MG capsule Take 1 capsule (37.5 mg total) by mouth every morning. 30 capsule 1   ferrous sulfate 325 (65 FE) MG tablet Take 1 tablet (325 mg total) by mouth every other day. 21 tablet 0   No current facility-administered medications on file prior to visit.    ROS:   Denies any headaches, blurred vision, fatigue, shortness of breath, chest pain, abdominal pain, abnormal vaginal discharge/itching/odor/irritation, problems with periods, bowel movements, urination, or intercourse unless otherwise stated above.  Physical exam:  Vitals:   11/23/21 1615  BP: 123/84  Pulse: 65  Temp: 97.9 F (36.6 C)  SpO2: 96%    BP 123/84   Pulse 65   Temp 97.9 F (36.6 C) (Oral)   Ht 5\' 4"  (1.626 m)   Wt 239 lb 6.4 oz (108.6 kg)   LMP 11/15/2021 (Exact Date)   SpO2 96%   Breastfeeding No   BMI 41.09 kg/m  General appearance: alert, appears stated age, and morbidly obese Head: Normocephalic, without obvious abnormality, atraumatic Ears: normal TM's and external ear canals both ears Nose: Nares normal. Septum midline. Mucosa normal. No drainage or sinus tenderness. Neck: no adenopathy, no carotid bruit, no JVD, supple, symmetrical, trachea midline, and thyroid not enlarged, symmetric, no tenderness/mass/nodules Back: symmetric, no curvature. ROM normal. No CVA tenderness. Lungs: clear to auscultation bilaterally Heart: regular rate and rhythm, S1, S2 normal, no murmur, click, rub or gallop Extremities: extremities normal, atraumatic, no cyanosis or edema Skin:  Skin color, texture, turgor normal. No rashes or lesions Lymph nodes: Cervical, supraclavicular, and axillary nodes normal.  Assessment and Plan: Obesity with co morbid conditions.  General weight loss/lifestyle modification strategies discussed (elicit support from others; identify saboteurs; non-food rewards, etc). Diet interventions: monitoring carbs and qualitative changes (increase low-fat,  high-fiber  foods). Medication: phentermine. Follow up in: 1 month and as needed.   This note has been created with Education officer, environmental. Any transcriptional errors are unintentional.   Grayce Sessions, NP 11/23/2021, 4:25 PM

## 2021-11-23 NOTE — Patient Instructions (Signed)
Phentermine Capsules or Tablets What is this medication? PHENTERMINE (FEN ter meen) promotes weight loss. It works by decreasing appetite. It is often used for a short period of time. Changes to diet and exercise are often combined with this medication. This medicine may be used for other purposes; ask your health care provider or pharmacist if you have questions. COMMON BRAND NAME(S): Adipex-P, Atti-Plex P, Atti-Plex P Spansule, Fastin, Lomaira, Pro-Fast, Pro-Fast HS, Pro-Fast SA, Tara-8 What should I tell my care team before I take this medication? They need to know if you have any of these conditions: Agitation or nervousness Diabetes Glaucoma Heart disease High blood pressure History of drug abuse or addiction History of stroke Kidney disease Lung disease called Primary Pulmonary Hypertension (PPH) Taken an MAOI like Carbex, Eldepryl, Marplan, Nardil, or Parnate in last 14 days Taking stimulant medications for attention disorders, weight loss, or to stay awake Thyroid disease An unusual or allergic reaction to phentermine, other medications, foods, dyes, or preservatives Pregnant or trying to get pregnant Breast-feeding How should I use this medication? Take this medication by mouth with a glass of water. Follow the directions on the prescription label. Take your medication at regular intervals. Do not take it more often than directed. Do not stop taking except on your care team's advice. Talk to your care team about the use of this medication in children. While this medication may be prescribed for children 17 years or older for selected conditions, precautions do apply. Overdosage: If you think you have taken too much of this medicine contact a poison control center or emergency room at once. NOTE: This medicine is only for you. Do not share this medicine with others. What if I miss a dose? If you miss a dose, take it as soon as you can. If it is almost time for your next dose, take  only that dose. Do not take double or extra doses. What may interact with this medication? Do not take this medication with any of the following: MAOIs like Carbex, Eldepryl, Marplan, Nardil, and Parnate This medication may also interact with the following: Alcohol Certain medications for depression, anxiety, or psychotic disorders Certain medications for high blood pressure Linezolid Medications for colds or breathing difficulties like pseudoephedrine or phenylephrine Medications for diabetes Sibutramine Stimulant medications for attention disorders, weight loss, or to stay awake This list may not describe all possible interactions. Give your health care provider a list of all the medicines, herbs, non-prescription drugs, or dietary supplements you use. Also tell them if you smoke, drink alcohol, or use illegal drugs. Some items may interact with your medicine. What should I watch for while using this medication? Visit your care team for regular checks on your progress. Do not stop taking except on your care team's advice. You may develop a severe reaction. Your care team will tell you how much medication to take. Do not take this medication close to bedtime. It may prevent you from sleeping. You may get drowsy or dizzy. Do not drive, use machinery, or do anything that needs mental alertness until you know how this medication affects you. Do not stand or sit up quickly, especially if you are an older patient. This reduces the risk of dizzy or fainting spells. Alcohol may increase dizziness and drowsiness. Avoid alcoholic drinks. This medication may affect blood sugar levels. Ask your care team if changes in diet or medications are needed if you have diabetes. Women should inform their care team if they wish to become   pregnant or think they might be pregnant. Losing weight while pregnant is not advised and may cause harm to the unborn child. Talk to your care team for more information. What side  effects may I notice from receiving this medication? Side effects that you should report to your care team as soon as possible: Allergic reactions--skin rash, itching, hives, swelling of the face, lips, tongue, or throat Heart valve disease--shortness of breath, chest pain, unusual weakness or fatigue, dizziness, feeling faint or lightheaded, fever, sudden weight gain, fast or irregular heartbeat Pulmonary hypertension--shortness of breath, chest pain, fast or irregular heartbeat, feeling faint or lightheaded, fatigue, swelling of the ankles or feet Side effects that usually do not require medical attention (report to your care team if they continue or are bothersome): Change in taste Diarrhea Dizziness Dry mouth Restlessness Trouble sleeping This list may not describe all possible side effects. Call your doctor for medical advice about side effects. You may report side effects to FDA at 1-800-FDA-1088. Where should I keep my medication? Keep out of the reach of children. This medication can be abused. Keep your medication in a safe place to protect it from theft. Do not share this medication with anyone. Selling or giving away this medication is dangerous and against the law. This medication may cause harm and death if it is taken by other adults, children, or pets. Return medication that has not been used to an official disposal site. Contact the DEA at 1-800-882-9539 or your city/county government to find a site. If you cannot return the medication, mix any unused medication with a substance like cat litter or coffee grounds. Then throw the medication away in a sealed container like a sealed bag or coffee can with a lid. Do not use the medication after the expiration date. Store at room temperature between 20 and 25 degrees C (68 and 77 degrees F). Keep container tightly closed. NOTE: This sheet is a summary. It may not cover all possible information. If you have questions about this medicine,  talk to your doctor, pharmacist, or health care provider.  2023 Elsevier/Gold Standard (2021-03-04 00:00:00)  

## 2021-12-03 IMAGING — US US MFM UA CORD DOPPLER
1 series · 13 of 28 positions shown · non-contrast
Comparison: none

[Series 1: us mfm ua cord doppler · 36 acquisitions, 13 frames shown]
[im 2/36]
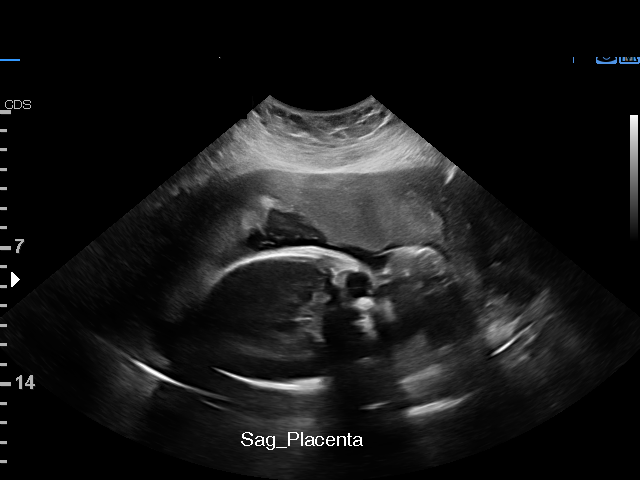
[im 4/36]
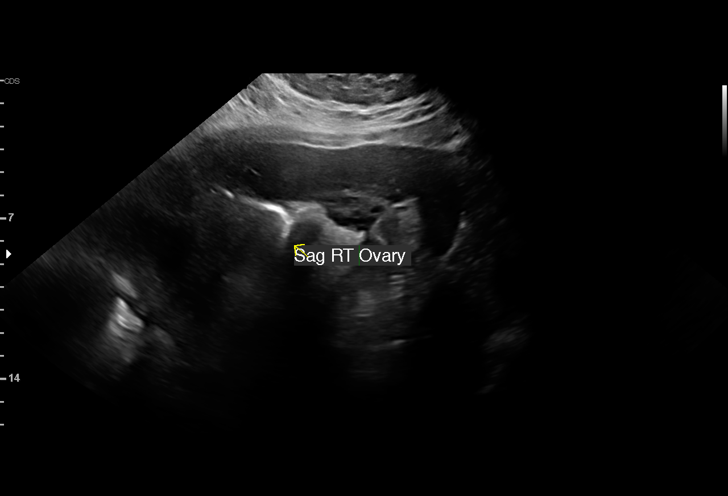
[im 7/36]
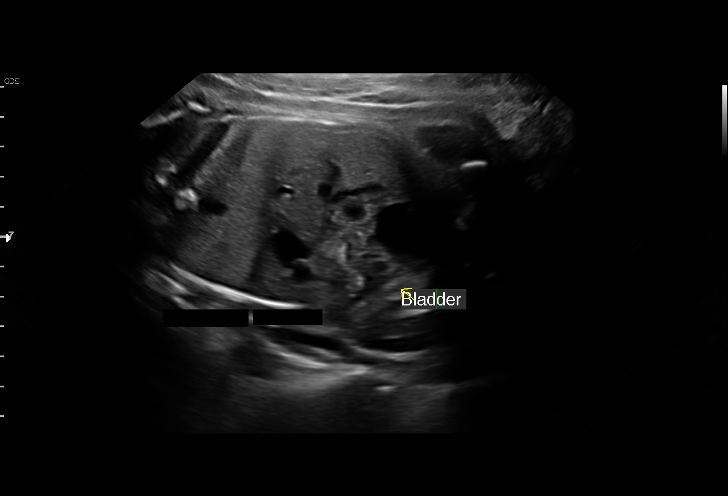
[im 10/36]
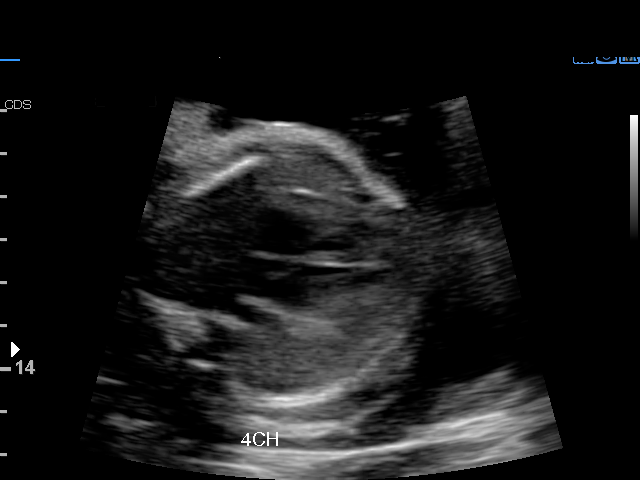
[im 12/36]
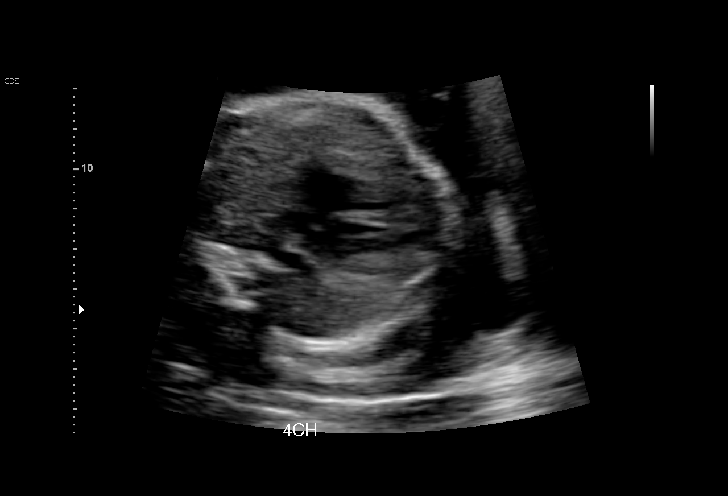
[im 15/36]
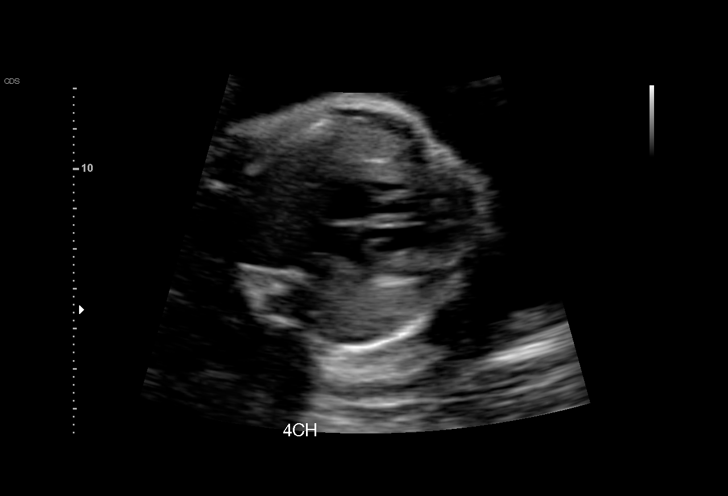
[im 19/36]
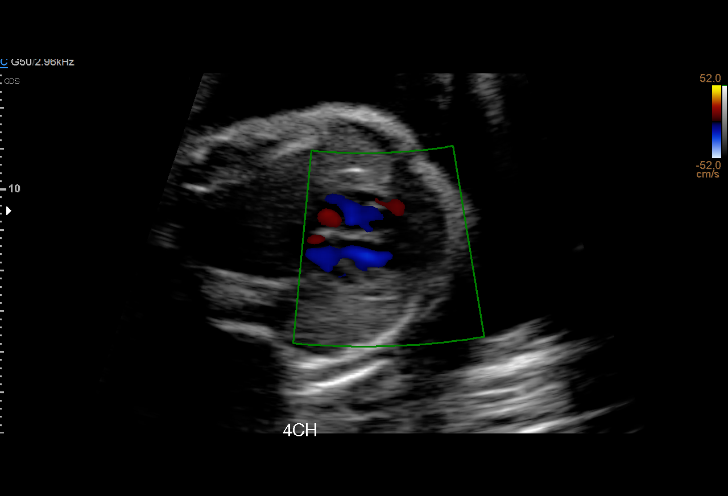
[im 21/36]
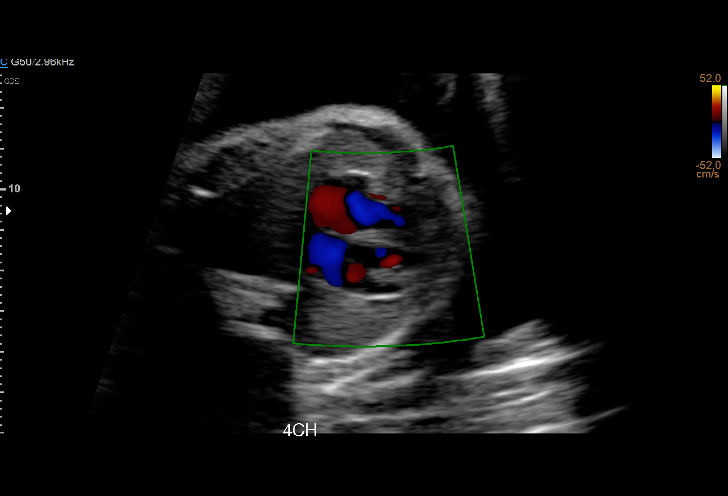
[im 24/36]
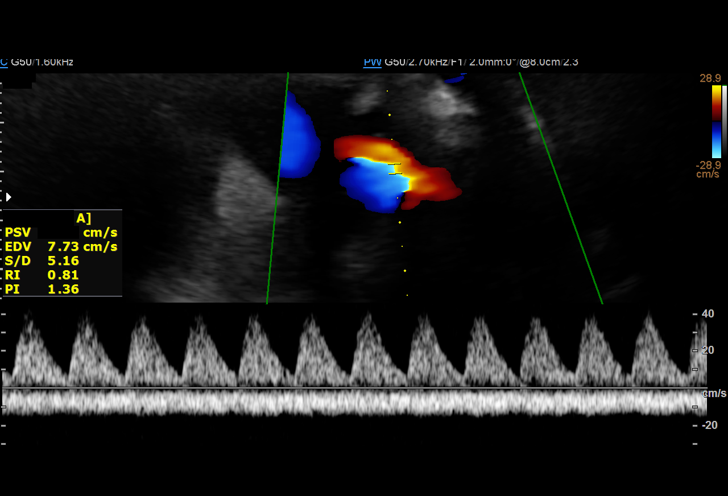
[im 26/36]
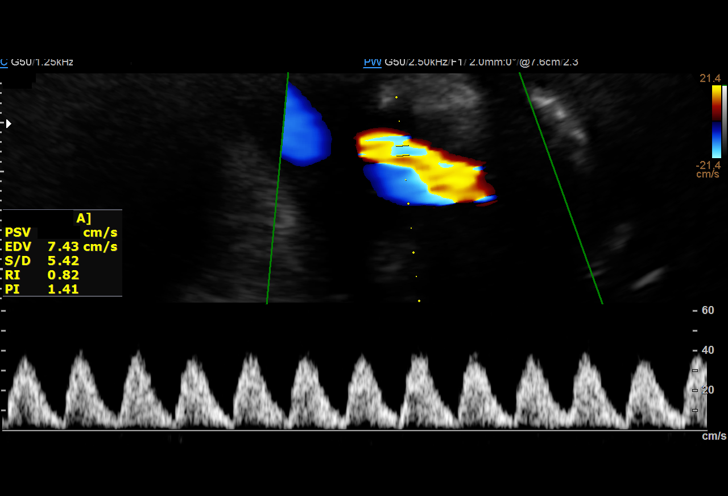
[im 29/36]
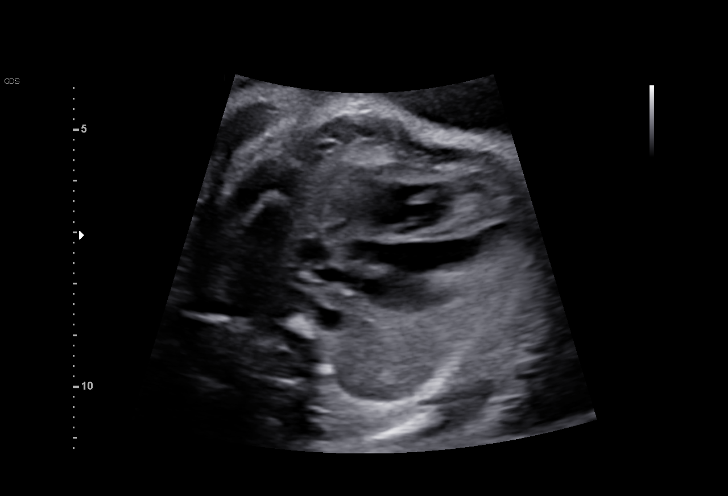
[im 32/36]
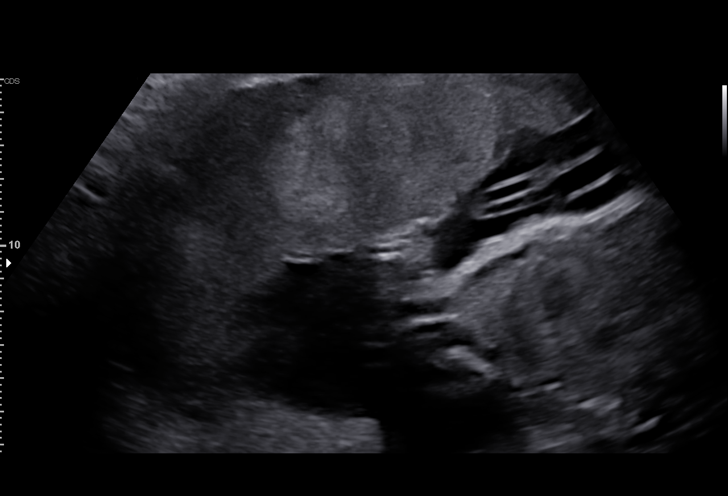
[im 34/36]
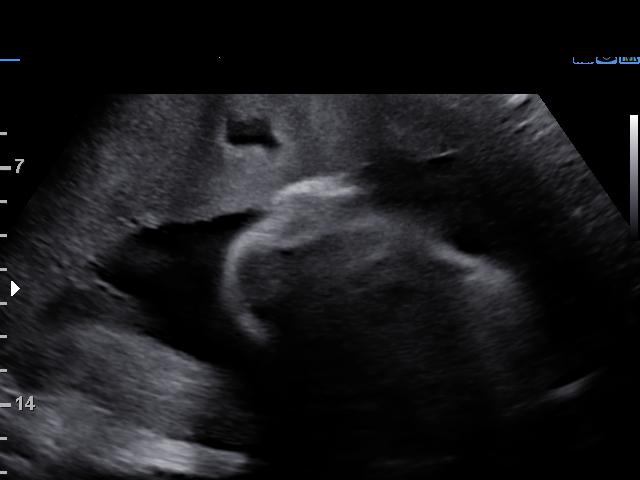

[13 of 28 positions shown; findings below may reference images not displayed]

Indications

 Maternal care for known or suspected poor
 fetal growth, third trimester, fetus 1 IUGR
 29 weeks gestation of pregnancy
 Genetic carrier (Fab, Taran)
 Obesity complicating pregnancy, third
 trimester
Fetal Evaluation

 Num Of Fetuses:         1
 Fetal Heart Rate(bpm):  143
 Cardiac Activity:       Observed
 Presentation:           Cephalic
 Placenta:               Anterior
 P. Cord Insertion:      Previously Visualized

 Amniotic Fluid
 AFI FV:      Within normal limits

 AFI Sum(cm)     %Tile       Largest Pocket(cm)
 11.81           27

 RUQ(cm)       RLQ(cm)       LUQ(cm)        LLQ(cm)
 4.96          3.85          0              3
OB History

 Gravidity:    2          SAB:   1
Gestational Age
 LMP:           32w 3d        Date:  09/23/19                 EDD:   06/29/20
 Best:          29w 0d     Det. By:  Previous Ultrasound      EDD:   07/23/20
                                     (12/16/19)
Anatomy

 Cranium:               Appears normal         Aortic Arch:            Previously seen
 Cavum:                 Previously seen        Ductal Arch:            Previously seen
 Ventricles:            Previously seen        Diaphragm:              Appears normal
 Choroid Plexus:        Previously seen        Stomach:                Appears normal, left
                                                                       sided
 Cerebellum:            Previously seen        Abdomen:                Previously seen
 Posterior Fossa:       Previously seen        Abdominal Wall:         Previously seen
 Nuchal Fold:           Previously seen        Cord Vessels:           Previously seen
 Face:                  Appears normal         Kidneys:                Appear normal
                        (orbits and profile)
 Lips:                  Previously seen        Bladder:                Appears normal
 Thoracic:              Appears normal         Spine:                  Previously seen
 Heart:                 Appears normal         Upper Extremities:      Previously seen
                        (4CH, axis, and
                        situs)
 RVOT:                  Previously seen        Lower Extremities:      Previously seen
 LVOT:                  Previously seen

 Other:  Fetus appears to be a male. Nasal bone visualized. Heels/feet and
         open hands/5th digits visualized.
Doppler - Fetal Vessels

 Umbilical Artery
  S/D     %tile      RI    %tile                             ADFV    RDFV
  5.42   > 97.5    0.82   > 97.5                                No      No

Cervix Uterus Adnexa

 Cervix
 Not visualized (advanced GA >77wks)

 Uterus
 No abnormality visualized.

 Right Ovary
 Within normal limits.

 Left Ovary
 Not visualized.
Comments

 This patient was seen due to an IUGR fetus.  She denies any
 problems since her last exam.  She reports feeling vigorous
 fetal movements throughout the day.
 There was normal amniotic fluid noted on today's ultrasound
 exam.
 Doppler studies of the umbilical arteries performed due to
 fetal growth restriction continues to show an elevated S/D
 ratio of 5.42.  There were no signs of absent or reversed end-
 diastolic flow noted today.
 The patient subsequently had a reactive nonstress test for
 her gestational age.
 Multiple echogenic areas were noted within the placenta
 indicating that these are most likely areas of infarction within
 the placenta.  The patient was advised that placental
 dysfunction is the cause of fetal growth restriction and the
 elevated umbilical artery Doppler studies.

 The increased risk of an adverse fetal outcome such as
 stillbirth associated with fetal growth restriction was
 discussed.  She was advised to continue to monitor fetal
 movements on a daily basis.

 She will return in 1 week for another umbilical artery Doppler
 study and NST.

 She understands that an earlier delivery (at between 34 to 37
 weeks) depending on her fetal testing may be recommended.

## 2021-12-20 IMAGING — US US MFM UA CORD DOPPLER
1 series · 13 of 13 positions shown · non-contrast
Comparison: none

[Series 1: us mfm ua cord doppler · 13 acquisitions, 13 frames shown]
[im 1/13]
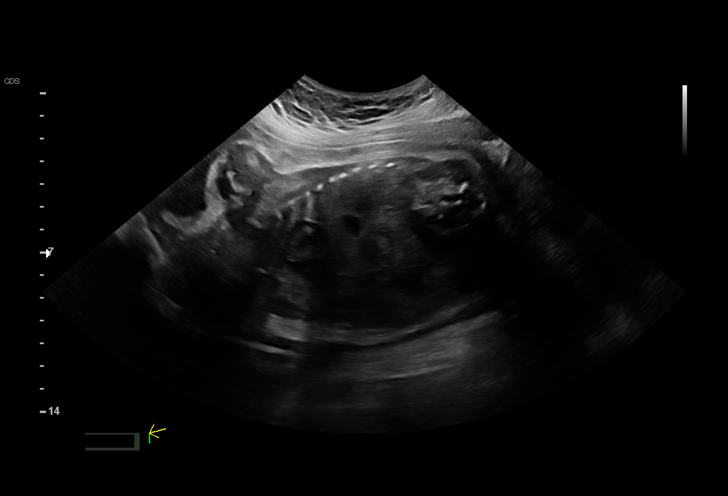
[im 2/13]
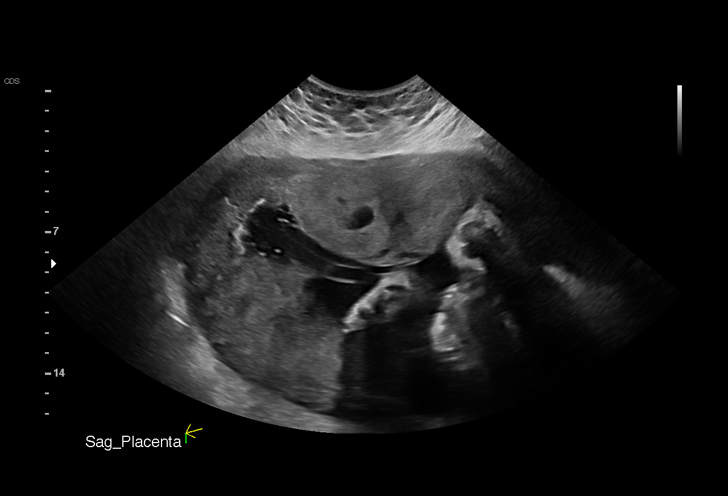
[im 3/13]
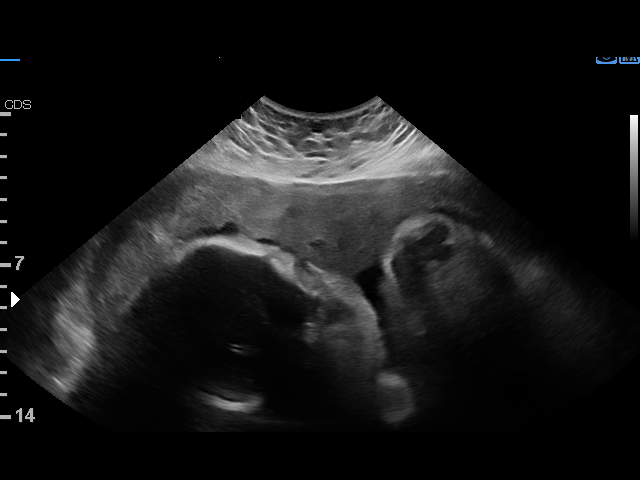
[im 4/13]
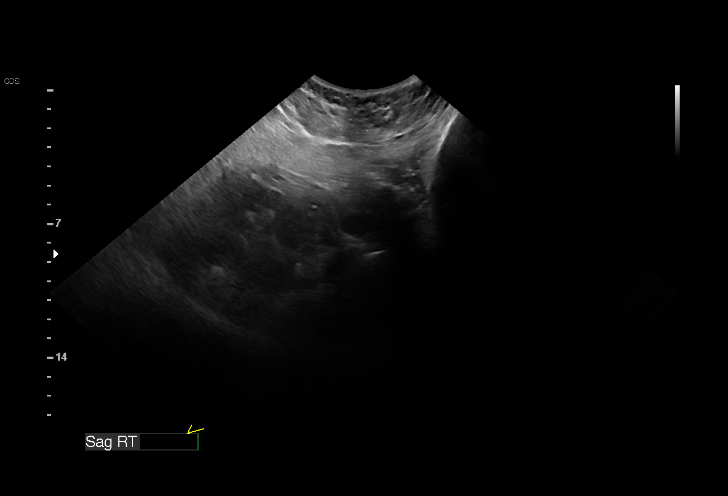
[im 5/13]
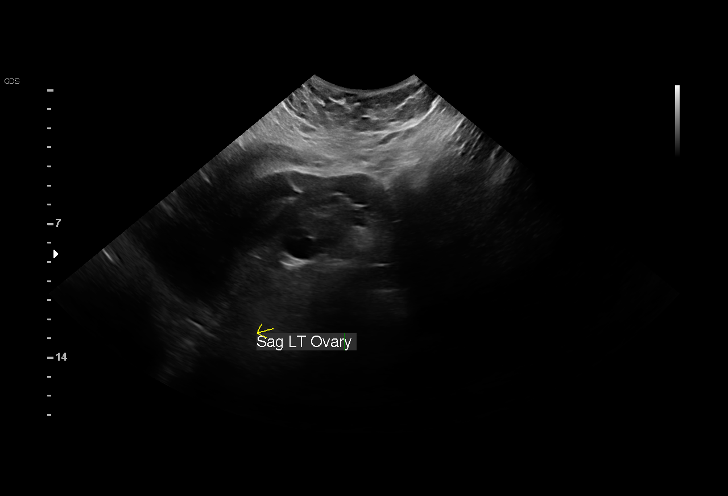
[im 6/13]
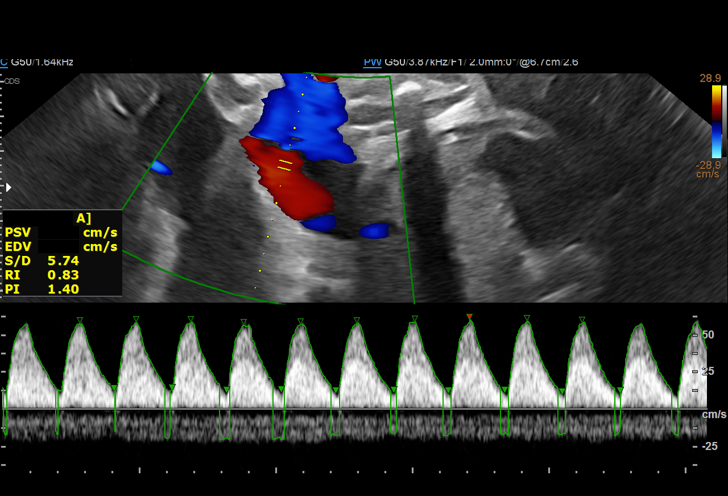
[im 7/13]
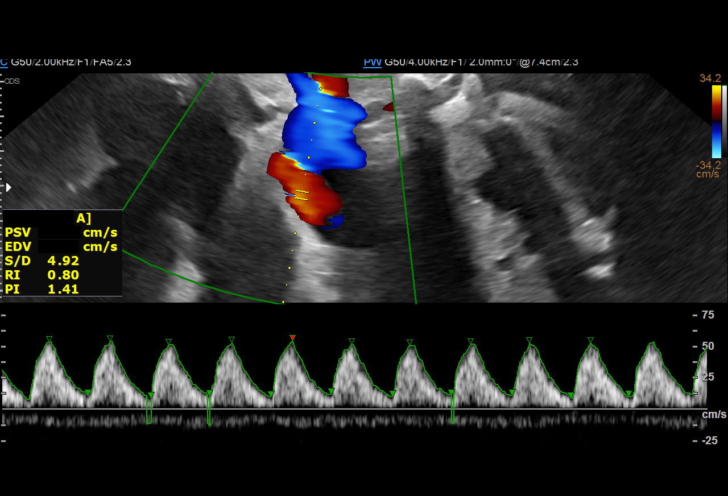
[im 8/13]
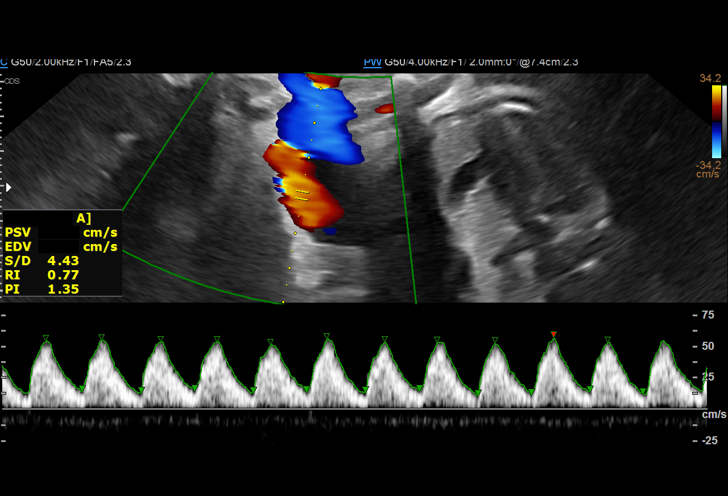
[im 9/13]
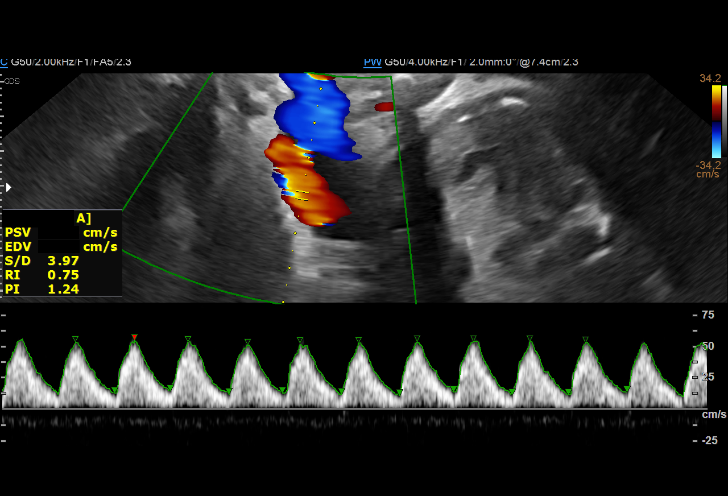
[im 10/13]
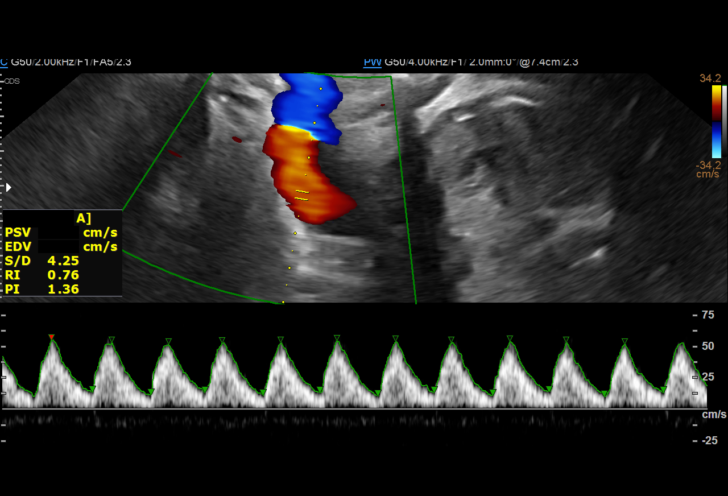
[im 11/13]
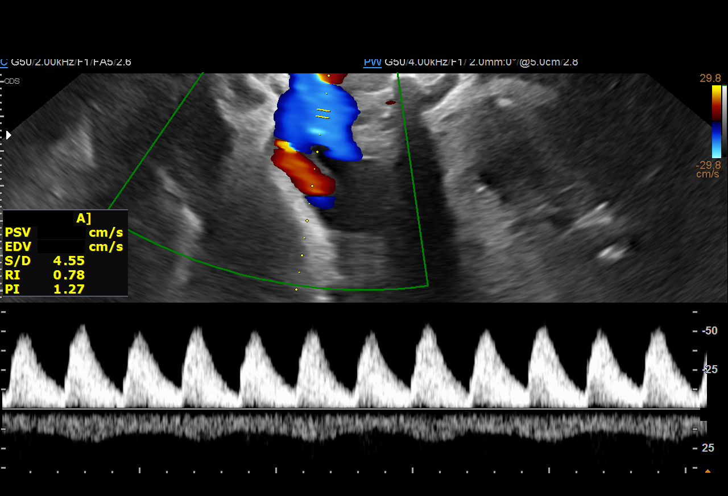
[im 12/13]
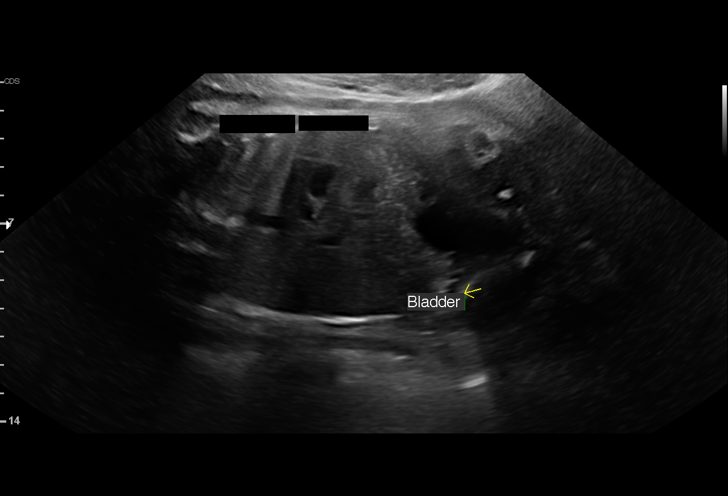
[im 13/13]
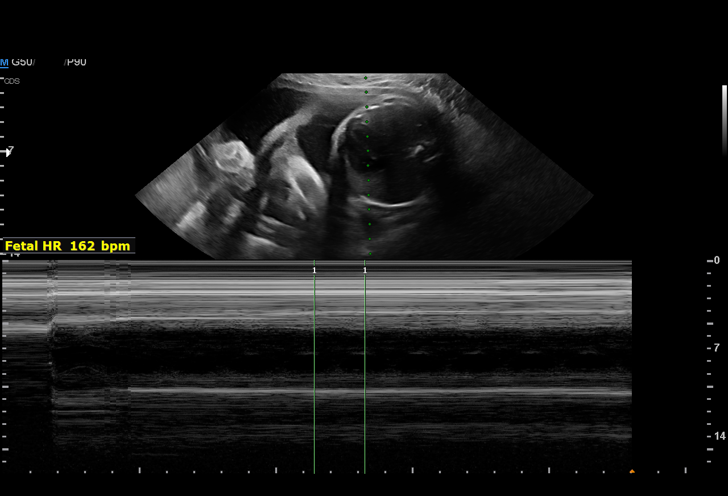

[13 of 13 positions shown; findings below may reference images not displayed]

Indications

 31 weeks gestation of pregnancy
 Maternal care for known or suspected poor
 fetal growth, third trimester, not applicable or
 unspecified IUGR
 Antenatal follow-up for nonvisualized fetal
 anatomy
 Genetic carrier (Senda, Ji Hen)
 Obesity complicating pregnancy, third
 trimester
Fetal Evaluation

 Num Of Fetuses:         1
 Fetal Heart Rate(bpm):  162
 Cardiac Activity:       Observed
 Presentation:           Breech
 Placenta:               Fundal
 P. Cord Insertion:      Previously Visualized

 Amniotic Fluid
 AFI FV:      Within normal limits

 AFI Sum(cm)     %Tile       Largest Pocket(cm)
 17.76           66

 RUQ(cm)       RLQ(cm)       LUQ(cm)        LLQ(cm)

OB History

 Gravidity:    2          SAB:   1
Gestational Age

 LMP:           34w 6d        Date:  09/23/19                 EDD:   06/29/20
 Best:          31w 3d     Det. By:  Previous Ultrasound      EDD:   07/23/20
                                     (12/16/19)
Anatomy

 Cranium:               Appears normal         Aortic Arch:            Previously seen
 Cavum:                 Previously seen        Ductal Arch:            Previously seen
 Ventricles:            Previously seen        Diaphragm:              Appears normal
 Choroid Plexus:        Previously seen        Stomach:                Appears normal, left
                                                                       sided
 Cerebellum:            Previously seen        Abdomen:                Appears normal
 Posterior Fossa:       Previously seen        Abdominal Wall:         Previously seen
 Nuchal Fold:           Previously seen        Cord Vessels:           Previously seen
 Face:                  Orbits and profile     Kidneys:                Previously seen
                        previously seen
 Lips:                  Previously seen        Bladder:                Appears normal
 Thoracic:              Appears normal         Spine:                  Previously seen
 Heart:                 Previously seen        Upper Extremities:      Previously seen
 RVOT:                  Previously seen        Lower Extremities:      Previously seen
 LVOT:                  Previously seen

 Other:  Male gender previously seen. Nasal bone previously seen. Heels/feet
         and open hands/5th digits visualized previously.
Doppler - Fetal Vessels

 Umbilical Artery
  S/D     %tile      RI    %tile      PI    %tile
  4.24   > 97.5    0.76       96    1.29       96

Impression

 Fetal growth restriction.  On ultrasound performed last week,
 the estimated fetal weight was at the 4th percentile.  She also
 had abnormal Doppler studies.

 On today's ultrasound, amniotic fluid is normal and good fetal
 activity seen.  Umbilical artery Doppler showed increased S/D
 ratio.  NST is reactive.

 We reassured the patient of the findings.
Recommendations

 -BPP, NST and UA Doppler next week and then weekly till
 delivery.
 -Fetal growth in 2 weeks.
                 Ajahar, [place]

## 2021-12-23 ENCOUNTER — Ambulatory Visit (INDEPENDENT_AMBULATORY_CARE_PROVIDER_SITE_OTHER): Payer: BC Managed Care – PPO | Admitting: Primary Care

## 2021-12-26 ENCOUNTER — Ambulatory Visit (INDEPENDENT_AMBULATORY_CARE_PROVIDER_SITE_OTHER): Payer: BC Managed Care – PPO | Admitting: Primary Care

## 2022-01-11 ENCOUNTER — Encounter (INDEPENDENT_AMBULATORY_CARE_PROVIDER_SITE_OTHER): Payer: Self-pay | Admitting: Primary Care

## 2022-01-11 ENCOUNTER — Ambulatory Visit (INDEPENDENT_AMBULATORY_CARE_PROVIDER_SITE_OTHER): Payer: BC Managed Care – PPO | Admitting: Primary Care

## 2022-01-11 VITALS — BP 128/86 | HR 84 | Resp 16 | Wt 231.8 lb

## 2022-01-11 DIAGNOSIS — I10 Essential (primary) hypertension: Secondary | ICD-10-CM | POA: Diagnosis not present

## 2022-01-13 NOTE — Progress Notes (Signed)
[name], is a 31 y.o. female  [phone]  PPJ:[id number]  DOB - 05-08-1990  Chief Complaint  Patient presents with   Blood Pressure Check       Subjective:   Brianna Lee is a 31 y.o. female here today for a follow up for blood pressure check which is 128/86.  She is being adherent to taking her medications daily and incorporating exercise.  Patient has No headache, No chest pain, No abdominal pain - No Nausea, No new weakness tingling or numbness, No Cough - shortness of breath  No problems updated.  Allergies  Allergen Reactions   Naproxen Swelling    Past Medical History:  Diagnosis Date   BMI 45.0-49.9, adult ([place]) 05/13/2020   COVID-19 affecting pregnancy in second trimester 04/29/2020   Hypertension    Scoliosis     Current Outpatient Medications on File Prior to Visit  Medication Sig Dispense Refill   Blood Pressure Monitoring (BLOOD PRESSURE MONITOR AUTOMAT) DEVI 1 Device by Does not apply route daily. Automatic blood pressure cuff regular size. To monitor blood pressure regularly at home. ICD-10 code:Z34.90 1 each 0   cetirizine (ZYRTEC ALLERGY) 10 MG tablet Take 1 tablet (10 mg total) by mouth daily. 90 tablet 1   Cholecalciferol (VITAMIN D3) 50 MCG (2000 UT) capsule Take 1 capsule (2,000 Units total) by mouth daily. 90 capsule 1   ferrous sulfate 325 (65 FE) MG tablet Take 1 tablet (325 mg total) by mouth every other day. 21 tablet 0   fluticasone (FLONASE) 50 MCG/ACT nasal spray Place 2 sprays into both nostrils daily. 16 g 6   NIFEdipine (ADALAT CC) 30 MG 24 hr tablet Take 1 tablet (30 mg total) by mouth daily. 90 tablet 1   phentermine 37.5 MG capsule Take 1 capsule (37.5 mg total) by mouth every morning. 30 capsule 1   No current facility-administered medications on file prior to visit.    Objective:   Vitals:   01/11/22 1557  BP: 128/86  Pulse: 84  Resp: 16  SpO2: 100%  Weight: 231 lb 12.8 oz (105.1 kg)     Exam General appearance : Awake, alert, not in any distress. Speech Clear. Not toxic looking HEENT: Atraumatic and Normocephalic, pupils equally reactive to light and accomodation Neck: Supple, no JVD. No cervical lymphadenopathy.  Chest: Good air entry bilaterally, no added sounds  CVS: S1 S2 regular, no murmurs.  Abdomen: Bowel sounds present, Non tender and not distended with no gaurding, rigidity or rebound. Extremities: B/L Lower Ext shows no edema, both legs are warm to touch Neurology: Awake alert, and oriented X 3, CN II-XII intact, Non focal Skin: No Rash  Data Review Lab Results  Component Value Date   HGBA1C <4.2 (L) 12/09/2019    Assessment & Plan  Chai was seen today for blood pressure check.  Diagnoses and all orders for this visit:  Essential hypertension Blood pressure is close to goal less than 130/80. Explained that having normal blood pressure is the goal and medications are helping to get to goal and maintain normal blood pressure. DIET: Limit salt intake, read nutrition labels to check salt content, limit fried and high fatty foods  Avoid using multisymptom OTC cold preparations that generally contain sudafed which can rise BP. Consult with pharmacist on best cold relief products to use for persons with HTN EXERCISE Discussed incorporating exercise such as walking - 30 minutes most days of the week and can do in 10 minute intervals  Morbid obesity (HCC) Previously agreed will put her on weight loss medication if she was able to lose weight by monitoring caloric intake and increasing exercise.  If this goal was obtained she will be placed on phentermine.  We discussed the side effects and when to stop the medication if needed.  Today she was prescribed phentermine 37.5 take once daily  Patient have been counseled extensively about nutrition and exercise. Other issues discussed during this visit include: low cholesterol diet, weight control and daily  exercise, foot care, annual eye examinations at Ophthalmology, importance of adherence with medications and regular follow-up. We also discussed long term complications of uncontrolled diabetes and hypertension.   Return in about 4 weeks (around 02/08/2022) for virtual weight.  The patient was given clear instructions to go to ER or return to medical center if symptoms don't improve, worsen or new problems develop. The patient verbalized understanding. The patient was told to call to get lab results if they haven't heard anything in the next week.   This note has been created with Education officer, environmental. Any transcriptional errors are unintentional.   Grayce Sessions, NP 01/13/2022, 1:48 PM

## 2022-02-08 ENCOUNTER — Telehealth (INDEPENDENT_AMBULATORY_CARE_PROVIDER_SITE_OTHER): Payer: BC Managed Care – PPO | Admitting: Primary Care

## 2022-02-23 ENCOUNTER — Telehealth (INDEPENDENT_AMBULATORY_CARE_PROVIDER_SITE_OTHER): Payer: BC Managed Care – PPO | Admitting: Primary Care

## 2022-02-27 NOTE — Progress Notes (Unsigned)
           [facility]   Brianna Lee, is a 31 y.o. female presents for a follow up after starting on {phentermine, Qsymia} for weight loss for {NUMBERS 0-12:18577} months. Patient states they have {DIET GOALS:20014}. While on the {phentermine, Qsymia} they have lost {NUMBERS 0-12:18577} lbs since last visit. They deny palpitations, anxiety, trouble sleeping, elevated BP.   BP Readings from Last 3 Encounters:  01/11/22 128/86  11/23/21 123/84  10/25/21 118/84    Wt Readings from Last 3 Encounters:  01/11/22 231 lb 12.8 oz (105.1 kg)  11/23/21 239 lb 6.4 oz (108.6 kg)  10/25/21 240 lb 9.6 oz (109.1 kg)    Typical breakfast: Typical lunch:  Typical dinner:  Medications: Current Outpatient Medications on File Prior to Visit  Medication Sig Dispense Refill   Blood Pressure Monitoring (BLOOD PRESSURE MONITOR AUTOMAT) DEVI 1 Device by Does not apply route daily. Automatic blood pressure cuff regular size. To monitor blood pressure regularly at home. ICD-10 code:Z34.90 1 each 0   cetirizine (ZYRTEC ALLERGY) 10 MG tablet Take 1 tablet (10 mg total) by mouth daily. 90 tablet 1   Cholecalciferol (VITAMIN D3) 50 MCG (2000 UT) capsule Take 1 capsule (2,000 Units total) by mouth daily. 90 capsule 1   ferrous sulfate 325 (65 FE) MG tablet Take 1 tablet (325 mg total) by mouth every other day. 21 tablet 0   fluticasone (FLONASE) 50 MCG/ACT nasal spray Place 2 sprays into both nostrils daily. 16 g 6   NIFEdipine (ADALAT CC) 30 MG 24 hr tablet Take 1 tablet (30 mg total) by mouth daily. 90 tablet 1   phentermine 37.5 MG capsule Take 1 capsule (37.5 mg total) by mouth every morning. 30 capsule 1   No current facility-administered medications on file prior to visit.    ROS:   Denies any headaches, blurred vision, fatigue, shortness of breath, chest pain, abdominal pain, abnormal vaginal discharge/itching/odor/irritation, problems with periods, bowel movements, urination, or  intercourse unless otherwise stated above.  Physical exam:  There were no vitals filed for this visit.  {Exam; complete female:17872} {female exam, choose systems:17926}  Assessment and Plan: Obesity with co morbid conditions.  {plan; obesity:18780}   This note has been created with Education officer, environmental. Any transcriptional errors are unintentional.   Grayce Sessions, NP 02/27/2022, 2:14 PM

## 2022-02-28 NOTE — Progress Notes (Signed)
  [facility]  Virtual Visit I connected with Brianna Lee, on 02/23/2022 at 2:20 PM through an audio and video application and verified that I am speaking with the correct person using two identifiers.   Consent: I discussed the limitations, risks, security and privacy concerns of performing an evaluation and management service by telephone and the availability of in person appointments. I also discussed with the patient that there may be a patient responsible charge related to this service. The patient expressed understanding and agreed to proceed.   Location of Patient: Home  Location of Provider: [facility]   Persons participating in Telemedicine visit: Brianna Casper [facility] Rosalita Levan,  NP  History of Present Illness:  [name], is a 31 y.o. female presents for a follow up after starting on phentermine for weight loss for 3 months. PCP and patient agreed due to the up coming holidays would place weight loss management on hold until the New Year.  Patient states they have better variety and increased physical activity.She denies palpitations, anxiety, trouble sleeping, elevated BP.   Past Medical History:  Diagnosis Date   BMI 45.0-49.9, adult (HCC) 05/13/2020   COVID-19 affecting pregnancy in second trimester 04/29/2020   Hypertension    Scoliosis    Allergies  Allergen Reactions   Naproxen Swelling    Current Outpatient Medications on File Prior to Visit  Medication Sig Dispense Refill   Blood Pressure Monitoring (BLOOD PRESSURE MONITOR AUTOMAT) DEVI 1 Device by Does not apply route daily. Automatic blood pressure cuff regular size. To monitor blood pressure regularly at home. ICD-10 code:Z34.90 1 each 0   cetirizine (ZYRTEC ALLERGY) 10 MG tablet Take 1 tablet (10 mg total) by mouth daily. 90 tablet 1   Cholecalciferol (VITAMIN D3) 50 MCG (2000 UT) capsule Take 1 capsule (2,000 Units total) by  mouth daily. 90 capsule 1   ferrous sulfate 325 (65 FE) MG tablet Take 1 tablet (325 mg total) by mouth every other day. 21 tablet 0   fluticasone (FLONASE) 50 MCG/ACT nasal spray Place 2 sprays into both nostrils daily. 16 g 6   NIFEdipine (ADALAT CC) 30 MG 24 hr tablet Take 1 tablet (30 mg total) by mouth daily. 90 tablet 1   phentermine 37.5 MG capsule Take 1 capsule (37.5 mg total) by mouth every morning. 30 capsule 1   No current facility-administered medications on file prior to visit.    Observations/Objective: There were no vitals taken for this visit.   Assessment and Plan: Diagnoses and all orders for this visit:  Morbid obesity (HCC) Weight management on hold until the first of the year.  I discussed the assessment and treatment plan with the patient. The patient was provided an opportunity to ask questions and all were answered. The patient agreed with the plan and demonstrated an understanding of the instructions.   The patient was advised to call back or seek an in-person evaluation if the symptoms worsen or if the condition fails to improve as anticipated.     I provided 12 minutes total of non-face-to-face time during this encounter including median intraservice time, reviewing previous notes, investigations, ordering medications, medical decision making, coordinating care and patient verbalized understanding at the end of the visit.    This note has been created with Education officer, environmental. Any transcriptional errors are unintentional.   Grayce Sessions, NP 02/28/2022, 2:20 PM

## 2022-09-26 ENCOUNTER — Encounter (INDEPENDENT_AMBULATORY_CARE_PROVIDER_SITE_OTHER): Payer: Self-pay | Admitting: Primary Care

## 2022-09-26 ENCOUNTER — Other Ambulatory Visit (HOSPITAL_COMMUNITY)
Admission: RE | Admit: 2022-09-26 | Discharge: 2022-09-26 | Disposition: A | Discharge status: Home / Self Care | Payer: BC Managed Care – PPO | Source: Ambulatory Visit | Attending: Primary Care | Admitting: Primary Care

## 2022-09-26 ENCOUNTER — Ambulatory Visit (INDEPENDENT_AMBULATORY_CARE_PROVIDER_SITE_OTHER): Payer: BC Managed Care – PPO | Admitting: Primary Care

## 2022-09-26 VITALS — BP 123/83 | HR 85 | Resp 16 | Wt 244.8 lb

## 2022-09-26 DIAGNOSIS — Z1151 Encounter for screening for human papillomavirus (HPV): Secondary | ICD-10-CM | POA: Diagnosis not present

## 2022-09-26 DIAGNOSIS — Z01419 Encounter for gynecological examination (general) (routine) without abnormal findings: Secondary | ICD-10-CM | POA: Diagnosis not present

## 2022-09-26 DIAGNOSIS — Z113 Encounter for screening for infections with a predominantly sexual mode of transmission: Secondary | ICD-10-CM | POA: Diagnosis not present

## 2022-09-26 DIAGNOSIS — Z124 Encounter for screening for malignant neoplasm of cervix: Secondary | ICD-10-CM

## 2022-09-28 LAB — CERVICOVAGINAL ANCILLARY ONLY
Bacterial Vaginitis (gardnerella): NEGATIVE
Candida Glabrata: NEGATIVE
Candida Vaginitis: NEGATIVE
Chlamydia: NEGATIVE
Comment: NEGATIVE
Comment: NEGATIVE
Comment: NEGATIVE
Comment: NEGATIVE
Comment: NEGATIVE
Comment: NORMAL
Neisseria Gonorrhea: NEGATIVE
Trichomonas: NEGATIVE

## 2022-09-29 LAB — CYTOLOGY - PAP
Adequacy: ABSENT
Comment: NEGATIVE
Diagnosis: NEGATIVE
High risk HPV: NEGATIVE

## 2022-09-29 NOTE — Progress Notes (Signed)
  [facility]  WELL-WOMAN PHYSICAL & PAP Patient name: Brianna Lee MRN [id number]  Date of birth: [birth year] Chief Complaint:   Gynecologic Exam  History of Present Illness:   Brianna Lee is a 32 y.o. [id number] female being seen today for a routine well-woman exam.   CC:gyn   The current method of family planning is IUD.  No LMP recorded. (Menstrual status: IUD). Last pap 09/26/19.   Family h/o breast cancer: No  Family h/o colorectal cancer: No  Review of Systems:    Denies any headaches, blurred vision, fatigue, shortness of breath, chest pain, abdominal pain, abnormal vaginal discharge/itching/odor/irritation, problems with periods, bowel movements, urination, or intercourse unless otherwise stated above.  Pertinent History Reviewed:   Reviewed past medical,surgical, social and family history.  Reviewed problem list, medications and allergies.  Physical Assessment:   Vitals:   09/26/22 1720  BP: 123/83  Pulse: 85  Resp: 16  SpO2: 97%  Weight: 244 lb 12.8 oz (111 kg)  Body mass index is 42.02 kg/m.        Physical Examination:  General appearance - well appearing, and in no distress Mental status - alert, oriented to person, place, and time Psych:  She has a normal mood and affect Skin - warm and dry, normal color, no suspicious lesions noted Chest - effort normal, all lung fields clear to auscultation bilaterally Heart - normal rate and regular rhythm Neck:  midline trachea, no thyromegaly or nodules Breasts - breasts appear normal, no suspicious masses, no skin or nipple changes or axillary nodes Educated patient on proper self breast examination and had patient to demonstrate SBE. Abdomen - soft, nontender, nondistended, no masses or organomegaly Pelvic-VULVA: normal appearing vulva with no masses, tenderness or lesions   VAGINA: normal appearing vagina with normal color and discharge, no lesions   CERVIX: normal appearing cervix without  discharge or lesions, no CMT UTERUS: uterus is felt to be normal size, shape, consistency and nontender  ADNEXA: No adnexal masses or tenderness noted. Extremities:  No swelling or varicosities noted  No results found for this or any previous visit (from the past 24 hour(s)).   Assessment & Plan:  Tamija was seen today for gynecologic exam.  Diagnoses and all orders for this visit:  Cervical cancer screening -     Cervicovaginal ancillary only -     Cytology - PAP    This note has been created with Dragon speech recognition software and Paediatric nurse. Any transcriptional errors are unintentional.   Grayce Sessions, NP 09/29/2022, 2:13 PM

## 2023-01-04 DIAGNOSIS — J020 Streptococcal pharyngitis: Secondary | ICD-10-CM | POA: Diagnosis not present

## 2023-01-04 DIAGNOSIS — R0981 Nasal congestion: Secondary | ICD-10-CM | POA: Diagnosis not present

## 2023-01-04 DIAGNOSIS — Z20822 Contact with and (suspected) exposure to covid-19: Secondary | ICD-10-CM | POA: Diagnosis not present

## 2023-01-04 DIAGNOSIS — J029 Acute pharyngitis, unspecified: Secondary | ICD-10-CM | POA: Diagnosis not present

## 2023-02-14 DIAGNOSIS — M79645 Pain in left finger(s): Secondary | ICD-10-CM | POA: Diagnosis not present

## 2023-02-14 DIAGNOSIS — S67191A Crushing injury of left index finger, initial encounter: Secondary | ICD-10-CM | POA: Diagnosis not present

## 2023-05-14 ENCOUNTER — Ambulatory Visit (INDEPENDENT_AMBULATORY_CARE_PROVIDER_SITE_OTHER): Payer: BC Managed Care – PPO | Admitting: Primary Care

## 2023-05-14 ENCOUNTER — Encounter (INDEPENDENT_AMBULATORY_CARE_PROVIDER_SITE_OTHER): Payer: Self-pay | Admitting: Primary Care

## 2023-05-14 VITALS — BP 121/84 | HR 68 | Resp 16 | Ht 64.0 in | Wt 247.8 lb

## 2023-05-14 DIAGNOSIS — M940 Chondrocostal junction syndrome [Tietze]: Secondary | ICD-10-CM | POA: Diagnosis not present

## 2023-05-14 DIAGNOSIS — I10 Essential (primary) hypertension: Secondary | ICD-10-CM | POA: Diagnosis not present

## 2023-05-14 DIAGNOSIS — E785 Hyperlipidemia, unspecified: Secondary | ICD-10-CM

## 2023-05-14 DIAGNOSIS — Z131 Encounter for screening for diabetes mellitus: Secondary | ICD-10-CM

## 2023-05-14 DIAGNOSIS — Z2821 Immunization not carried out because of patient refusal: Secondary | ICD-10-CM

## 2023-05-14 DIAGNOSIS — E559 Vitamin D deficiency, unspecified: Secondary | ICD-10-CM | POA: Diagnosis not present

## 2023-05-14 DIAGNOSIS — Z6841 Body Mass Index (BMI) 40.0 and over, adult: Secondary | ICD-10-CM

## 2023-05-14 DIAGNOSIS — E66813 Obesity, class 3: Secondary | ICD-10-CM

## 2023-05-14 NOTE — Patient Instructions (Signed)
Costochondritis  Costochondritis is irritation and swelling (inflammation) of the tissue that connects the ribs to the breastbone (sternum). This tissue is called cartilage. This condition causes pain in the front of the chest. The pain often starts slowly. It may be in more than one rib. What are the causes? The cause of this condition is not always known. It can come from stress on the sternum. The cause of this stress could be: Chest injury. Exercise or activity. This may include lifting. Very bad coughing. What increases the risk? Being female. Being 25-56 years old. Starting a new exercise or work activity. Having low levels of vitamin D. Having a condition that makes you cough a lot. What are the signs or symptoms? Chest pain that: Starts slowly. It can be sharp or dull. Gets worse with deep breathing, coughing, or exercise. Gets better with rest. May be worse when you press on your ribs and breastbone. How is this treated? In most cases, this condition goes away on its own over time. You may need to take an NSAID, such as ibuprofen. This can help reduce pain. You may also need to: Rest and stay away from activities that make pain worse. Put heat or ice on the area that hurts. Do exercises to stretch your chest muscles. If these treatments do not help, your doctor may inject a medicine to numb the area. This can help relieve the pain. Follow these instructions at home: Managing pain, stiffness, and swelling     If told, put ice on the painful area. To do this: Put ice in a plastic bag. Place a towel between your skin and the bag. Leave the ice on for 20 minutes, 2-3 times a day. If told, put heat on the affected area. Do this as often as told by your doctor. Use the heat source that your doctor recommends, such as a moist heat pack or a heating pad. Place a towel between your skin and the heat source. Leave the heat on for 20-30 minutes. If your skin turns bright red,  take off the ice or heat right away to prevent skin damage. The risk of skin damage is higher if you cannot feel pain, heat, or cold. Activity Rest as told by your doctor. Do not do things that make your pain worse. This includes activities that use your chest, belly (abdomen), and side muscles. You may have to avoid lifting. Ask your doctor how much you can safely lift. Return to your normal activities when your doctor says that it is safe. General instructions Take over-the-counter and prescription medicines only as told by your doctor. Contact a doctor if: You have chills or a fever. Your pain does not go away or gets worse. You have a cough that does not go away. Get help right away if: You have a hard time breathing. You have very bad chest pain that does not get better with medicines, heat, or ice. These symptoms may be an emergency. Get help right away. Call 911. Do not wait to see if the symptoms will go away. Do not drive yourself to the hospital. This information is not intended to replace advice given to you by your health care provider. Make sure you discuss any questions you have with your health care provider. Document Revised: 10/20/2021 Document Reviewed: 10/20/2021 Elsevier Patient Education  2024 [employer].

## 2023-05-15 ENCOUNTER — Encounter (INDEPENDENT_AMBULATORY_CARE_PROVIDER_SITE_OTHER): Payer: Self-pay | Admitting: Primary Care

## 2023-05-15 LAB — CMP14+EGFR
ALT: 13 [IU]/L (ref 0–32)
AST: 12 [IU]/L (ref 0–40)
Albumin: 3.9 g/dL (ref 3.9–4.9)
Alkaline Phosphatase: 72 [IU]/L (ref 44–121)
BUN/Creatinine Ratio: 13 (ref 9–23)
BUN: 10 mg/dL (ref 6–20)
Bilirubin Total: 0.3 mg/dL (ref 0.0–1.2)
CO2: 21 mmol/L (ref 20–29)
Calcium: 8.9 mg/dL (ref 8.7–10.2)
Chloride: 104 mmol/L (ref 96–106)
Creatinine, Ser: 0.76 mg/dL (ref 0.57–1.00)
Globulin, Total: 3 g/dL (ref 1.5–4.5)
Glucose: 71 mg/dL (ref 70–99)
Potassium: 4.5 mmol/L (ref 3.5–5.2)
Sodium: 141 mmol/L (ref 134–144)
Total Protein: 6.9 g/dL (ref 6.0–8.5)
eGFR: 107 mL/min/{1.73_m2} (ref >59)

## 2023-05-15 LAB — CBC WITH DIFFERENTIAL/PLATELET
Basophils Absolute: 0 10*3/uL (ref 0.0–0.2)
Basos: 1 %
EOS (ABSOLUTE): 0 10*3/uL (ref 0.0–0.4)
Eos: 0 %
Hematocrit: 45.2 % (ref 34.0–46.6)
Hemoglobin: 14.4 g/dL (ref 11.1–15.9)
Immature Grans (Abs): 0 10*3/uL (ref 0.0–0.1)
Immature Granulocytes: 0 %
Lymphocytes Absolute: 1.8 10*3/uL (ref 0.7–3.1)
Lymphs: 38 %
MCH: 26.6 pg (ref 26.6–33.0)
MCHC: 31.9 g/dL (ref 31.5–35.7)
MCV: 83 fL (ref 79–97)
Monocytes Absolute: 0.5 10*3/uL (ref 0.1–0.9)
Monocytes: 10 %
Neutrophils Absolute: 2.4 10*3/uL (ref 1.4–7.0)
Neutrophils: 51 %
Platelets: 303 10*3/uL (ref 150–450)
RBC: 5.42 x10E6/uL — ABNORMAL HIGH (ref 3.77–5.28)
RDW: 14 % (ref 11.7–15.4)
WBC: 4.7 10*3/uL (ref 3.4–10.8)

## 2023-05-15 LAB — LIPID PANEL
Chol/HDL Ratio: 2.9 {ratio} (ref 0.0–4.4)
Cholesterol, Total: 139 mg/dL (ref 100–199)
HDL: 48 mg/dL (ref >39)
LDL Chol Calc (NIH): 80 mg/dL (ref 0–99)
Triglycerides: 52 mg/dL (ref 0–149)
VLDL Cholesterol Cal: 11 mg/dL (ref 5–40)

## 2023-05-15 LAB — HEMOGLOBIN A1C
Est. average glucose Bld gHb Est-mCnc: 77 mg/dL
Hgb A1c MFr Bld: 4.3 % — ABNORMAL LOW (ref 4.8–5.6)

## 2023-05-15 LAB — VITAMIN D 25 HYDROXY (VIT D DEFICIENCY, FRACTURES): Vit D, 25-Hydroxy: 12.9 ng/mL — ABNORMAL LOW (ref 30.0–100.0)

## 2023-05-15 NOTE — Progress Notes (Signed)
[facility]  Brianna Lee, is a 33 y.o. female  ZOX:[id number]  WJX:[id number]  DOB - 1990/11/03  Chief Complaint  Patient presents with   Hypertension       Subjective:   Brianna Lee is a 33 y.o. female here today for a follow up visit Hypertension/Bp check.  Patient has No headache, No chest pain, No abdominal pain - No Nausea, No new weakness tingling or numbness, No Cough - shortness of breath. She c/o chest pain able to replicate pain on chest explained Costochondritis   No problems updated.  Comprehensive ROS Pertinent positive and negative noted in HPI   Allergies  Allergen Reactions   Naproxen Swelling    Past Medical History:  Diagnosis Date   BMI 45.0-49.9, adult (HCC) 05/13/2020   COVID-19 affecting pregnancy in second trimester 04/29/2020   Hypertension    Scoliosis     Current Outpatient Medications on File Prior to Visit  Medication Sig Dispense Refill   Blood Pressure Monitoring (BLOOD PRESSURE MONITOR AUTOMAT) DEVI 1 Device by Does not apply route daily. Automatic blood pressure cuff regular size. To monitor blood pressure regularly at home. ICD-10 code:Z34.90 1 each 0   cetirizine (ZYRTEC ALLERGY) 10 MG tablet Take 1 tablet (10 mg total) by mouth daily. 90 tablet 1   Cholecalciferol (VITAMIN D3) 50 MCG (2000 UT) capsule Take 1 capsule (2,000 Units total) by mouth daily. 90 capsule 1   fluticasone (FLONASE) 50 MCG/ACT nasal spray Place 2 sprays into both nostrils daily. 16 g 6   No current facility-administered medications on file prior to visit.   Health Maintenance  Topic Date Due   COVID-19 Vaccine (3 - 2024-25 season) 12/17/2022   Flu Shot  07/16/2023*   Pap with HPV screening  09/26/2027   DTaP/Tdap/Td vaccine (2 - Td or Tdap) 05/17/2031   Hepatitis C Screening  Completed   HIV Screening  Completed   HPV Vaccine  Aged Out  *Topic was postponed. The date shown is not the original due date.    Objective:   Vitals:   05/14/23  1155  BP: 121/84  Pulse: 68  Resp: 16  SpO2: 100%  Weight: 247 lb 12.8 oz (112.4 kg)  Height: 5\' 4"  (1.626 m)   BP Readings from Last 3 Encounters:  05/14/23 121/84  09/26/22 123/83  01/11/22 128/86      Physical Exam Vitals reviewed.  Constitutional:      Appearance: Normal appearance. She is obese.     Comments: morbid  HENT:     Head: Normocephalic.     Right Ear: Tympanic membrane, ear canal and external ear normal.     Left Ear: Tympanic membrane, ear canal and external ear normal.     Nose: Nose normal.     Mouth/Throat:     Mouth: Mucous membranes are moist.  Eyes:     Extraocular Movements: Extraocular movements intact.     Pupils: Pupils are equal, round, and reactive to light.  Cardiovascular:     Rate and Rhythm: Normal rate.  Pulmonary:     Effort: Pulmonary effort is normal.     Breath sounds: Normal breath sounds.  Abdominal:     General: Bowel sounds are normal.     Palpations: Abdomen is soft.  Musculoskeletal:        General: Normal range of motion.     Cervical back: Normal range of motion.  Skin:    General: Skin is warm and dry.  Neurological:  Mental Status: She is alert and oriented to person, place, and time.  Psychiatric:        Mood and Affect: Mood normal.        Behavior: Behavior normal.        Thought Content: Thought content normal.      Assessment & Plan  Brianna Lee was seen today for hypertension.  Diagnoses and all orders for this visit:  Influenza vaccination declined  Vitamin D deficiency Vitamin D is needed to make and keep bones strong. Previously on supplements  -     Vitamin D, 25-hydroxy  Essential hypertension -     CBC with Differential/Platelet -     CMP14+EGFR  Dyslipidemia (high LDL; low HDL)  Healthy lifestyle diet of fruits vegetables fish nuts whole grains and low saturated fat . Foods high in cholesterol or liver, fatty meats,cheese, butter avocados, nuts and seeds, chocolate and fried foods.  -      Lipid panel  Diabetes mellitus screening -     Hemoglobin A1c  Costochondritis Information place on AVS also discussed monitoring foods f/o GERD- Discussed eating small frequent meal, reduction in acidic foods, fried foods ,spicy foods, alcohol caffeine and tobacco and certain medications. Avoid laying down after eating 41mins-1hour, elevated head of the bed.    Patient have been counseled extensively about nutrition and exercise. Other issues discussed during this visit include: low cholesterol diet, weight control and daily exercise, foot care, annual eye examinations at Ophthalmology, importance of adherence with medications and regular follow-up. We also discussed long term complications of uncontrolled diabetes and hypertension.   Return in about 6 months (around 11/11/2023).  The patient was given clear instructions to go to ER or return to medical center if symptoms don't improve, worsen or new problems develop. The patient verbalized understanding. The patient was told to call to get lab results if they haven't heard anything in the next week.   This note has been created with Education officer, environmental. Any transcriptional errors are unintentional.   Grayce Sessions, NP 05/15/2023, 9:02 AM

## 2023-05-21 ENCOUNTER — Other Ambulatory Visit (INDEPENDENT_AMBULATORY_CARE_PROVIDER_SITE_OTHER): Payer: Self-pay | Admitting: Primary Care

## 2023-05-21 ENCOUNTER — Encounter (INDEPENDENT_AMBULATORY_CARE_PROVIDER_SITE_OTHER): Payer: Self-pay | Admitting: Primary Care

## 2023-05-21 MED ORDER — ERGOCALCIFEROL 1.25 MG (50000 UT) PO CAPS: 50000.0000 [IU] | ORAL_CAPSULE | ORAL | 0 refills | Status: AC

## 2023-05-24 ENCOUNTER — Telehealth (INDEPENDENT_AMBULATORY_CARE_PROVIDER_SITE_OTHER): Payer: Self-pay | Admitting: Primary Care

## 2023-05-24 NOTE — Telephone Encounter (Signed)
 Copied from CRM [id number]. Topic: Appointments - Scheduling Inquiry for Clinic >> May 24, 2023  1:04 PM Brianna Lee wrote: Reason for CRM: Patient needs to rescheduling upcoming appt. Patient grandmother funeral is tomorrow and her her appt is 2/7 at 10:30am. I called CAL but I no one answered. Patient can be reached at [phone]

## 2023-05-24 NOTE — Telephone Encounter (Signed)
 Called to remind pt about atp. Pt did not answer and VM left

## 2023-05-25 ENCOUNTER — Ambulatory Visit (INDEPENDENT_AMBULATORY_CARE_PROVIDER_SITE_OTHER): Payer: BC Managed Care – PPO | Admitting: Primary Care

## 2023-05-31 ENCOUNTER — Ambulatory Visit (INDEPENDENT_AMBULATORY_CARE_PROVIDER_SITE_OTHER): Payer: BC Managed Care – PPO | Admitting: Primary Care

## 2023-06-01 ENCOUNTER — Ambulatory Visit (INDEPENDENT_AMBULATORY_CARE_PROVIDER_SITE_OTHER): Payer: BC Managed Care – PPO | Admitting: Primary Care

## 2023-06-01 ENCOUNTER — Encounter (INDEPENDENT_AMBULATORY_CARE_PROVIDER_SITE_OTHER): Payer: Self-pay | Admitting: Primary Care

## 2023-06-01 VITALS — BP 122/78 | HR 65 | Resp 16 | Ht 64.0 in | Wt 250.6 lb

## 2023-06-01 DIAGNOSIS — E66813 Obesity, class 3: Secondary | ICD-10-CM

## 2023-06-01 DIAGNOSIS — Z6841 Body Mass Index (BMI) 40.0 and over, adult: Secondary | ICD-10-CM | POA: Diagnosis not present

## 2023-06-01 DIAGNOSIS — Z7689 Persons encountering health services in other specified circumstances: Secondary | ICD-10-CM

## 2023-06-01 NOTE — Progress Notes (Unsigned)
 [facility]   Brianna Lee, is a 33 y.o. female presents for concerns about her health regarding to weight.  She has tried exercising, dieting, fasting and the latest fads trends with no luck.  She is requesting to be sent to weight management clinic-[facility]?  Sent information out patient is scheduled for initial visit and follow-ups per clinic.  This will help her self-esteem and social anxiety. BP Readings from Last 3 Encounters:  06/01/23 122/78  05/14/23 121/84  09/26/22 123/83    Wt Readings from Last 3 Encounters:  06/01/23 250 lb 9.6 oz (113.7 kg)  05/14/23 247 lb 12.8 oz (112.4 kg)  09/26/22 244 lb 12.8 oz (111 kg)   Medications: Current Outpatient Medications on File Prior to Visit  Medication Sig Dispense Refill   Blood Pressure Monitoring (BLOOD PRESSURE MONITOR AUTOMAT) DEVI 1 Device by Does not apply route daily. Automatic blood pressure cuff regular size. To monitor blood pressure regularly at home. ICD-10 code:Z34.90 1 each 0   cetirizine (ZYRTEC ALLERGY) 10 MG tablet Take 1 tablet (10 mg total) by mouth daily. 90 tablet 1   Cholecalciferol (VITAMIN D3) 50 MCG (2000 UT) capsule Take 1 capsule (2,000 Units total) by mouth daily. 90 capsule 1   ergocalciferol (VITAMIN D2) 1.25 MG (50000 UT) capsule Take 1 capsule (50,000 Units total) by mouth once a week. 10 capsule 0   fluticasone (FLONASE) 50 MCG/ACT nasal spray Place 2 sprays into both nostrils daily. 16 g 6   No current facility-administered medications on file prior to visit.    ROS:   Denies any headaches, blurred vision, fatigue, shortness of breath, chest pain, abdominal pain, abnormal vaginal discharge/itching/odor/irritation, problems with periods, bowel movements, urination, or intercourse unless otherwise stated above.   Past Medical History:  Diagnosis Date   BMI 45.0-49.9, adult (HCC) 05/13/2020   COVID-19 affecting pregnancy in second trimester 04/29/2020    Hypertension    Scoliosis     Past Surgical History:  Procedure Laterality Date   CESAREAN SECTION N/A 06/17/2020   Procedure: CESAREAN SECTION;  Surgeon: Tereso Newcomer, MD;  Location: [facility] LD ORS;  Service: Obstetrics;  Laterality: N/A;   GALLBLADDER SURGERY      Current Outpatient Medications on File Prior to Visit  Medication Sig Dispense Refill   Blood Pressure Monitoring (BLOOD PRESSURE MONITOR AUTOMAT) DEVI 1 Device by Does not apply route daily. Automatic blood pressure cuff regular size. To monitor blood pressure regularly at home. ICD-10 code:Z34.90 1 each 0   cetirizine (ZYRTEC ALLERGY) 10 MG tablet Take 1 tablet (10 mg total) by mouth daily. 90 tablet 1   Cholecalciferol (VITAMIN D3) 50 MCG (2000 UT) capsule Take 1 capsule (2,000 Units total) by mouth daily. 90 capsule 1   ergocalciferol (VITAMIN D2) 1.25 MG (50000 UT) capsule Take 1 capsule (50,000 Units total) by mouth once a week. 10 capsule 0   fluticasone (FLONASE) 50 MCG/ACT nasal spray Place 2 sprays into both nostrils daily. 16 g 6   No current facility-administered medications on file prior to visit.    Allergies  Allergen Reactions   Naproxen Swelling    Family History  Problem Relation Age of Onset   Hypertension Father    Diabetes Father     Social History   Socioeconomic History   Marital status: Single    Spouse name: Not on file   Number of children: 1   Years of education: Not on  file   Highest education level: High school graduate  Occupational History   Not on file  Tobacco Use   Smoking status: Former    Types: Cigarettes   Smokeless tobacco: Never   Tobacco comments:    2 cigarettes a day   Vaping Use   Vaping status: Never Used  Substance and Sexual Activity   Alcohol use: Not Currently   Drug use: Never   Sexual activity: Yes    Birth control/protection: None  Other Topics Concern   Not on file  Social History Narrative   Not on file   Social Drivers of Health   Financial  Resource Strain: Low Risk  (12/09/2019)   Overall Financial Resource Strain (CARDIA)    Difficulty of Paying Living Expenses: Not hard at all  Food Insecurity: No Food Insecurity (02/05/2020)   Hunger Vital Sign    Worried About Running Out of Food in the Last Year: Never true    Ran Out of Food in the Last Year: Never true  Transportation Needs: No Transportation Needs (12/09/2019)   PRAPARE - Administrator, Civil Service (Medical): No    Lack of Transportation (Non-Medical): No  Physical Activity: Sufficiently Active (12/09/2019)   Exercise Vital Sign    Days of Exercise per Week: 4 days    Minutes of Exercise per Session: 40 min  Stress: No Stress Concern Present (12/09/2019)   [employer] of Occupational Health - Occupational Stress Questionnaire    Feeling of Stress : Not at all  Social Connections: Unknown (08/22/2021)   Received from [facility]   Social Network    Social Network: Not on file  Intimate Partner Violence: Unknown (07/19/2021)   Received from [facility]   HITS    Physically Hurt: Not on file    Insult or Talk Down To: Not on file    Threaten Physical Harm: Not on file    Scream or Curse: Not on file    ROS  BP 122/78   Pulse 65   Resp 16   Ht 5\' 4"  (1.626 m)   Wt 250 lb 9.6 oz (113.7 kg)   LMP 05/07/2023   SpO2 98%   BMI 43.02 kg/m   Physical Exam General: No apparent distress. Eyes: Extraocular eye movements intact, pupils equal and round. Neck: Supple, trachea midline. Thyroid: No enlargement, mobile without fixation, no tenderness. Cardiovascular: Regular rhythm and rate, no murmur, normal radial pulses. Respiratory: Normal respiratory effort, clear to auscultation. Gastrointestinal: Normal pitch active bowel sounds, nontender abdomen without distention or appreciable hepatomegaly. Musculoskeletal: Normal muscle tone, no tenderness on palpation of tibia, no excessive thoracic kyphosis. Skin:  Appropriate warmth, no visible rash. Mental status: Alert, conversant, speech clear, thought logical, appropriate mood and affect, no hallucinations or delusions evident. Hematologic/lymphatic: No cervical adenopathy, no visible ecchymoses.   Assessment and Plan: Whitleigh was seen today for weight management screening.  Diagnoses and all orders for this visit:  Encounter for weight management 2/2 Morbid obesity (HCC) Body mass index is 43.02 kg/m. Referred made appt set  This note has been created with Dragon speech recognition software and Paediatric nurse. Any transcriptional errors are unintentional.   Grayce Sessions, NP 06/01/2023, 11:17 AM

## 2023-06-05 DIAGNOSIS — M545 Low back pain, unspecified: Secondary | ICD-10-CM | POA: Diagnosis not present

## 2023-06-05 DIAGNOSIS — K219 Gastro-esophageal reflux disease without esophagitis: Secondary | ICD-10-CM | POA: Diagnosis not present

## 2023-06-05 DIAGNOSIS — Z6841 Body Mass Index (BMI) 40.0 and over, adult: Secondary | ICD-10-CM | POA: Diagnosis not present

## 2023-06-06 ENCOUNTER — Other Ambulatory Visit (HOSPITAL_BASED_OUTPATIENT_CLINIC_OR_DEPARTMENT_OTHER): Payer: Self-pay

## 2023-06-06 ENCOUNTER — Encounter (HOSPITAL_BASED_OUTPATIENT_CLINIC_OR_DEPARTMENT_OTHER): Payer: Self-pay

## 2023-06-06 MED ORDER — WEGOVY 0.25 MG/0.5ML ~~LOC~~ SOAJ
0.2500 mg | SUBCUTANEOUS | 0 refills | Status: DC
  Filled 2023-06-06 – 2023-06-21 (×2): qty 2, 28d supply, fill #0

## 2023-06-07 ENCOUNTER — Other Ambulatory Visit (HOSPITAL_BASED_OUTPATIENT_CLINIC_OR_DEPARTMENT_OTHER): Payer: Self-pay

## 2023-06-16 ENCOUNTER — Other Ambulatory Visit (HOSPITAL_BASED_OUTPATIENT_CLINIC_OR_DEPARTMENT_OTHER): Payer: Self-pay

## 2023-06-21 ENCOUNTER — Other Ambulatory Visit: Payer: Self-pay

## 2023-06-21 ENCOUNTER — Other Ambulatory Visit (HOSPITAL_BASED_OUTPATIENT_CLINIC_OR_DEPARTMENT_OTHER): Payer: Self-pay

## 2023-06-23 ENCOUNTER — Other Ambulatory Visit (HOSPITAL_BASED_OUTPATIENT_CLINIC_OR_DEPARTMENT_OTHER): Payer: Self-pay

## 2023-06-23 DIAGNOSIS — Z6841 Body Mass Index (BMI) 40.0 and over, adult: Secondary | ICD-10-CM | POA: Diagnosis not present

## 2023-06-23 DIAGNOSIS — R5383 Other fatigue: Secondary | ICD-10-CM | POA: Diagnosis not present

## 2023-06-23 DIAGNOSIS — F4323 Adjustment disorder with mixed anxiety and depressed mood: Secondary | ICD-10-CM | POA: Diagnosis not present

## 2023-06-23 MED ORDER — WEGOVY 0.5 MG/0.5ML ~~LOC~~ SOAJ
0.5400 mL | SUBCUTANEOUS | 0 refills | Status: DC
  Filled 2023-06-23: qty 2, 28d supply, fill #0

## 2023-06-27 DIAGNOSIS — Z888 Allergy status to other drugs, medicaments and biological substances status: Secondary | ICD-10-CM | POA: Diagnosis not present

## 2023-06-27 DIAGNOSIS — R42 Dizziness and giddiness: Secondary | ICD-10-CM | POA: Diagnosis not present

## 2023-06-27 DIAGNOSIS — R531 Weakness: Secondary | ICD-10-CM | POA: Diagnosis not present

## 2023-06-27 DIAGNOSIS — R519 Headache, unspecified: Secondary | ICD-10-CM | POA: Diagnosis not present

## 2023-06-27 DIAGNOSIS — M545 Low back pain, unspecified: Secondary | ICD-10-CM | POA: Diagnosis not present

## 2023-06-27 DIAGNOSIS — M549 Dorsalgia, unspecified: Secondary | ICD-10-CM | POA: Diagnosis not present

## 2023-07-04 DIAGNOSIS — Z880 Allergy status to penicillin: Secondary | ICD-10-CM | POA: Diagnosis not present

## 2023-07-04 DIAGNOSIS — Z79899 Other long term (current) drug therapy: Secondary | ICD-10-CM | POA: Diagnosis not present

## 2023-07-04 DIAGNOSIS — Z0000 Encounter for general adult medical examination without abnormal findings: Secondary | ICD-10-CM | POA: Diagnosis not present

## 2023-07-04 DIAGNOSIS — Z139 Encounter for screening, unspecified: Secondary | ICD-10-CM | POA: Diagnosis not present

## 2023-07-04 DIAGNOSIS — Z0289 Encounter for other administrative examinations: Secondary | ICD-10-CM | POA: Diagnosis not present

## 2023-07-20 ENCOUNTER — Other Ambulatory Visit (HOSPITAL_BASED_OUTPATIENT_CLINIC_OR_DEPARTMENT_OTHER): Payer: Self-pay

## 2023-07-21 ENCOUNTER — Other Ambulatory Visit (HOSPITAL_BASED_OUTPATIENT_CLINIC_OR_DEPARTMENT_OTHER): Payer: Self-pay

## 2023-07-21 DIAGNOSIS — R5383 Other fatigue: Secondary | ICD-10-CM | POA: Diagnosis not present

## 2023-07-21 DIAGNOSIS — F4323 Adjustment disorder with mixed anxiety and depressed mood: Secondary | ICD-10-CM | POA: Diagnosis not present

## 2023-07-21 DIAGNOSIS — Z6841 Body Mass Index (BMI) 40.0 and over, adult: Secondary | ICD-10-CM | POA: Diagnosis not present

## 2023-07-21 MED ORDER — WEGOVY 0.5 MG/0.5ML ~~LOC~~ SOAJ
0.5000 mL | SUBCUTANEOUS | 0 refills | Status: DC
  Filled 2023-07-21 (×2): qty 2, 28d supply, fill #0

## 2023-07-31 ENCOUNTER — Other Ambulatory Visit (HOSPITAL_BASED_OUTPATIENT_CLINIC_OR_DEPARTMENT_OTHER): Payer: Self-pay

## 2023-10-02 DIAGNOSIS — L03112 Cellulitis of left axilla: Secondary | ICD-10-CM | POA: Diagnosis not present

## 2023-10-22 ENCOUNTER — Ambulatory Visit: Payer: Self-pay | Admitting: *Deleted

## 2023-10-22 DIAGNOSIS — Z30431 Encounter for routine checking of intrauterine contraceptive device: Secondary | ICD-10-CM | POA: Diagnosis not present

## 2023-10-22 NOTE — Telephone Encounter (Signed)
 FYI Only or Action Required?: FYI only for provider.  Patient was last seen in primary care on 06/01/2023 by Celestia Rosaline SQUIBB, NP. Called Nurse Triage reporting Contraception. Symptoms began today. Interventions attempted: Nothing. Symptoms are: gradually worsening.  Has an IUD.  She feels a hard wire when she wipes.  Can see a blue wire looking thing sticking out of vagina.  Slight bleeding with wiping.    No appts with Rosaline Celestia, NP so referred to urgent care.    She is agreeable to going there now.  Triage Disposition: See Physician Within 24 Hours  Patient/caregiver understands and will follow disposition?: YesCopied from CRM [id number]. Topic: Clinical - Red Word Triage >> Oct 22, 2023  9:42 AM Ivette P wrote: Red Word that prompted transfer to Nurse Triage:  pt has IUD in 3 years. went to bathroom to wipe, feels string and everytime touch it. cramping. hurting. Reason for Disposition  Implant looks like it is coming out  Answer Assessment - Initial Assessment Questions 1. IMPLANT TYPE: What type of implant are you using?  (e.g., Jadelle , Nexplanon, Norplant )      I have a IUD.   I was wiping I felt a hard string.   The more I try to pull it I start cramping.   It's a blue wire.   It's really hurting.   I don't know what to do.     2. IMPLANT START DATE: When did you first start using the implant? (e.g., date; weeks, months, years ago)      It's been in 3 yrs this year.    3. IMPLANT LOCATION: Where is implant located? (e.g., abdomen, arm; left or right)     IUD 4. SYMPTOM: What is the main symptom (or question) you're concerned about?     A little bleeding. 5. ONSET: When did the hard string feltstart?     This morning 6. VAGINAL BLEEDING: Are you having any unusual vaginal bleeding?   - NONE: no bleeding   - SPOTTING: spotting, or pinkish / brownish mucous discharge; does not fill panty liner or pad    - MILD:  less than 1 pad / hour; less than patient's usual  menstrual bleeding   - MODERATE: 1-2 pads / hour; 1 menstrual cup every 6 hours; small-medium blood clots (e.g., pea, grape, small coin)   - SEVERE: soaking 2 or more pads/hour for 2 or more hours; 1 menstrual cup every 2 hours; bleeding not contained by pads or continuous red blood from vagina; large blood clots (e.g., golf ball, large coin)      A little bit 7. ABDOMEN OR PELVIC PAIN: Are you having any pain in your abdomen or pelvic area? (Scale: 0, 1-10; none, mild, moderate, severe)   - NONE (0): no pain   - MILD (1-3): doesn't interfere with normal activities, abdomen soft and not tender to touch    - MODERATE (4-7): interferes with normal activities or awakens from sleep, abdomen tender to touch    - SEVERE (8-10): excruciating pain, doubled over, unable to do any normal activities      Yes when I try to pull it out. 8. PREGNANCY: Are you concerned you might be pregnant? When was your last menstrual period?     I hope not  Protocols used: Contraception - Implant Symptoms and Questions-A-AH

## 2023-10-22 NOTE — Telephone Encounter (Signed)
 noted

## 2023-10-31 DIAGNOSIS — R1011 Right upper quadrant pain: Secondary | ICD-10-CM | POA: Diagnosis not present

## 2023-10-31 DIAGNOSIS — R519 Headache, unspecified: Secondary | ICD-10-CM | POA: Diagnosis not present

## 2023-11-06 ENCOUNTER — Telehealth (INDEPENDENT_AMBULATORY_CARE_PROVIDER_SITE_OTHER): Payer: Self-pay | Admitting: Primary Care

## 2023-11-06 NOTE — Telephone Encounter (Signed)
 Called pt to confirm appt. Pt did not answer and LVM for pt.

## 2023-11-07 ENCOUNTER — Encounter (INDEPENDENT_AMBULATORY_CARE_PROVIDER_SITE_OTHER): Payer: Self-pay

## 2023-11-07 ENCOUNTER — Telehealth (INDEPENDENT_AMBULATORY_CARE_PROVIDER_SITE_OTHER): Payer: Self-pay

## 2023-11-07 ENCOUNTER — Ambulatory Visit (INDEPENDENT_AMBULATORY_CARE_PROVIDER_SITE_OTHER): Admitting: Primary Care

## 2023-11-07 ENCOUNTER — Telehealth: Payer: Self-pay

## 2023-11-07 NOTE — Telephone Encounter (Signed)
 Returned call to [facility] and was informed that pt's IUD strings were protruding a week ago and pt pulled them. IUD did not come out but pt has been experiencing cramping since then. Schedulers moved pt's appt up to tomorrow to be evaluated, pt notified by [facility].

## 2023-11-07 NOTE — Telephone Encounter (Signed)
 Pt came into the office today for IUD concerns. Pt states she got her IUD placed back in 2022 after she had her baby. Pt states she was seen at the urgent beginning of July because she noticed she can see the IUD string hanging.   Reviewed 10/22/23 urgent care note and based on the what the provider stated Your IUD is in place. However, the IUD strings are unusually long. Your cervix is also sitting shallow in your vagina. This raises the concern for prolapse of your uterus. Please follow-up with your OBGYN closely for further evaluation. I recommend you abstain from penetrative intercourse at this time as it may cause discomfort due to the protrusion of your IUD strings.   Reached out to [facility] to get pt an appt schedule and some advice and was made aware to reach out to   [facility]  Reached out and pt was scheduled for 8/13 was transferred to nurse line because needing some recommendations because pt is still seeing string and some cramping. Lvm on nurse triage line.   Received call back from [place] and went over the concerns pt will need to be seen sooner and for the cramping she can take otc ibuprofen    Reached out to pt to confirm appt pt is aware and doesn't have any questions or concerns

## 2023-11-08 ENCOUNTER — Ambulatory Visit: Admitting: Obstetrics and Gynecology

## 2023-11-08 ENCOUNTER — Encounter: Payer: Self-pay | Admitting: Obstetrics and Gynecology

## 2023-11-08 VITALS — BP 130/84 | HR 90 | Ht 64.0 in | Wt 207.6 lb

## 2023-11-08 DIAGNOSIS — Z30430 Encounter for insertion of intrauterine contraceptive device: Secondary | ICD-10-CM

## 2023-11-08 DIAGNOSIS — Z30433 Encounter for removal and reinsertion of intrauterine contraceptive device: Secondary | ICD-10-CM

## 2023-11-08 DIAGNOSIS — Z975 Presence of (intrauterine) contraceptive device: Secondary | ICD-10-CM

## 2023-11-08 DIAGNOSIS — Z30432 Encounter for removal of intrauterine contraceptive device: Secondary | ICD-10-CM

## 2023-11-08 MED ORDER — LEVONORGESTREL 20 MCG/DAY IU IUD
1.0000 | INTRAUTERINE_SYSTEM | Freq: Once | INTRAUTERINE | Status: AC
  Administered 2023-11-08: 1 via INTRAUTERINE

## 2023-11-08 NOTE — Patient Instructions (Signed)

## 2023-11-08 NOTE — Progress Notes (Signed)
 Pt states she is no longer on wegovy , but rather on weekly shots b-12 and lipo.  Pt had progestin- releasing IUD placed on 06/17/2020 following c-section.   2 weeks IUD has been bothersome. Pt had noted blood (wasn't time for cycle) and felt the strings. She took her phone to look and could also see the strings. She started experiencing clots and heavy bleeding.  Pt is not currently bleeding, but still experiencing pain. Pt does heavy lifting at work and believes this could have been the cause.   Willing to continue IUD as contraception

## 2023-11-08 NOTE — Progress Notes (Signed)
    GYNECOLOGY OFFICE PROCEDURE NOTE  Brianna Lee is a 33 y.o. [phone] here for Liletta  IUD removal.On July 7 patient was lifting heavy equipment at work and noticed a sharp back pain and protrusion of IUD strings from vagina..  She went to urgent care and the practitioner pushed the strings back into the vagina she was instructed not to have intercourse patient still complains of bleeding and pain. Today she desires removal of IUD and replacement of a new one.  IUD Removal and Reinsertion Patient identified, informed consent performed, consent signed. Discussed risks of irregular bleeding, cramping, infection, malpositioning or misplacement of the IUD outside the uterus which may require further procedure such as laparoscopy. Time out was performed.   Patient was in the dorsal lithotomy position, normal external genitalia was noted.  A speculum was placed in the patient's vagina no lesions. The cervix was visualized, no lesions.  The strings of the IUD were grasped and pulled using ring forceps. The IUD was removed in its entirety.   Cleaned with Betadine  x 2.  Grasped anteriorly with a single tooth tenaculum.  Uterus sounded to 6.5 cm.  mirena  IUD placed per manufacturer's recommendations.  Strings trimmed to 3 cm. Tenaculum was removed, good hemostasis noted.  Patient tolerated procedure well.   Patient was given post-procedure instructions.  She was advised to have backup contraception for one week.  Patient was also asked to check IUD strings periodically and follow up in 4 weeks for IUD check.   Nidia Daring, FNP [facility] [employer], [facility]

## 2023-11-10 ENCOUNTER — Encounter: Payer: Self-pay | Admitting: Obstetrics and Gynecology

## 2023-11-10 DIAGNOSIS — Z975 Presence of (intrauterine) contraceptive device: Secondary | ICD-10-CM | POA: Insufficient documentation

## 2023-11-28 ENCOUNTER — Encounter: Admitting: Physician Assistant

## 2023-12-07 ENCOUNTER — Encounter: Payer: Self-pay | Admitting: Obstetrics and Gynecology

## 2023-12-07 ENCOUNTER — Ambulatory Visit (INDEPENDENT_AMBULATORY_CARE_PROVIDER_SITE_OTHER): Admitting: Obstetrics and Gynecology

## 2023-12-07 VITALS — BP 121/84 | HR 76 | Wt 207.4 lb

## 2023-12-07 DIAGNOSIS — Z30431 Encounter for routine checking of intrauterine contraceptive device: Secondary | ICD-10-CM | POA: Diagnosis not present

## 2023-12-07 NOTE — Progress Notes (Signed)
 Pt states periods are balancing out. Last menstrual cycle lasted 3-4 days. Pt denies any current issues with IUD.  No other concerns today.

## 2023-12-07 NOTE — Progress Notes (Signed)
    GYNECOLOGY OFFICE ENCOUNTER NOTE  History:  33 y.o. G2P0111 here today for today for IUD string check; Mirena   IUD was placed  11/08/23. No complaints about the IUD, no concerning side effects.  The following portions of the patient's history were reviewed and updated as appropriate: allergies, current medications, past family history, past medical history, past social history, past surgical history and problem list. Last pap smear on 09/26/22 was normal, negative HRHPV.  Review of Systems:  Pertinent items are noted in HPI.   Objective:  Physical Exam Blood pressure 121/84, pulse 76, weight 94.1 kg, last menstrual period 11/03/2023. CONSTITUTIONAL: Well-developed, well-nourished female in no acute distress.  NEUROLOGIC: Alert and oriented to person, place, and time. Normal reflexes, muscle tone coordination.  PSYCHIATRIC: Normal mood and affect. Normal behavior. Normal judgment and thought content. CARDIOVASCULAR: Normal heart rate noted RESPIRATORY: Effort and breath sounds normal, no problems with respiration noted ABDOMEN: Soft, no distention noted.   PELVIC: Normal appearing external genitalia; normal appearing vaginal mucosa and cervix.  IUD strings visualized, less than 1cm form the cervical os cm in length outside cervix. Done in the presence of a chaperone.   Assessment & Plan:  1. IUD check up (Primary) Strings visualized less than 1 cm from the cervical os.    Patient to keep IUD in place for up to eight years; can come in for removal earlier if she desires or for any concerning side effects. Recommended condoms for STI prevention.   Derrek JINNY Freund, NP [facility] [employer], [facility]

## 2024-01-23 DIAGNOSIS — Z20822 Contact with and (suspected) exposure to covid-19: Secondary | ICD-10-CM | POA: Diagnosis not present

## 2024-01-23 DIAGNOSIS — J069 Acute upper respiratory infection, unspecified: Secondary | ICD-10-CM | POA: Diagnosis not present

## 2024-02-04 DIAGNOSIS — F4381 Prolonged grief disorder: Secondary | ICD-10-CM | POA: Diagnosis not present

## 2024-02-06 DIAGNOSIS — S0502XA Injury of conjunctiva and corneal abrasion without foreign body, left eye, initial encounter: Secondary | ICD-10-CM | POA: Diagnosis not present

## 2024-03-12 DIAGNOSIS — H16002 Unspecified corneal ulcer, left eye: Secondary | ICD-10-CM | POA: Diagnosis not present

## 2024-03-19 DIAGNOSIS — M545 Low back pain, unspecified: Secondary | ICD-10-CM | POA: Diagnosis not present

## 2024-03-19 DIAGNOSIS — J020 Streptococcal pharyngitis: Secondary | ICD-10-CM | POA: Diagnosis not present

## 2024-04-08 DIAGNOSIS — S060X0A Concussion without loss of consciousness, initial encounter: Secondary | ICD-10-CM | POA: Diagnosis not present
